# Patient Record
Sex: Male | Born: 1935
Health system: Southern US, Community
[De-identification: ages and names within clinical notes are randomized; demographics above are authoritative.]

## PROBLEM LIST (undated history)

## (undated) DIAGNOSIS — Z7901 Long term (current) use of anticoagulants: Secondary | ICD-10-CM

## (undated) DIAGNOSIS — I4891 Unspecified atrial fibrillation: Secondary | ICD-10-CM

## (undated) DIAGNOSIS — I480 Paroxysmal atrial fibrillation: Secondary | ICD-10-CM

## (undated) DIAGNOSIS — H409 Unspecified glaucoma: Secondary | ICD-10-CM

## (undated) DIAGNOSIS — E663 Overweight: Secondary | ICD-10-CM

## (undated) DIAGNOSIS — N390 Urinary tract infection, site not specified: Secondary | ICD-10-CM

## (undated) DIAGNOSIS — I609 Nontraumatic subarachnoid hemorrhage, unspecified: Secondary | ICD-10-CM

## (undated) DIAGNOSIS — Z9181 History of falling: Secondary | ICD-10-CM

## (undated) DIAGNOSIS — I10 Essential (primary) hypertension: Secondary | ICD-10-CM

## (undated) DIAGNOSIS — Z933 Colostomy status: Secondary | ICD-10-CM

## (undated) DIAGNOSIS — C189 Malignant neoplasm of colon, unspecified: Secondary | ICD-10-CM

## (undated) HISTORY — DX: Paroxysmal atrial fibrillation: I48.0

## (undated) HISTORY — DX: Essential (primary) hypertension: I10

## (undated) HISTORY — DX: Malignant neoplasm of colon, unspecified: C18.9

## (undated) HISTORY — DX: Long term (current) use of anticoagulants: Z79.01

## (undated) HISTORY — PX: CATARACT EXTRACTION: SUR2

## (undated) HISTORY — DX: Overweight: E66.3

---

## 1996-08-12 DIAGNOSIS — C189 Malignant neoplasm of colon, unspecified: Secondary | ICD-10-CM

## 1996-08-12 HISTORY — DX: Malignant neoplasm of colon, unspecified: C18.9

## 1996-08-12 HISTORY — PX: ABDOMINOPERINEAL PROCTOCOLECTOMY: SUR8

## 2000-08-12 DIAGNOSIS — I480 Paroxysmal atrial fibrillation: Secondary | ICD-10-CM

## 2000-08-12 DIAGNOSIS — Z7901 Long term (current) use of anticoagulants: Secondary | ICD-10-CM

## 2000-08-12 HISTORY — DX: Paroxysmal atrial fibrillation: I48.0

## 2000-08-12 HISTORY — DX: Long term (current) use of anticoagulants: Z79.01

## 2007-11-11 HISTORY — PX: COLONOSCOPY: SHX174

## 2011-04-09 ENCOUNTER — Encounter: Payer: Self-pay | Admitting: Cardiology

## 2011-04-09 LAB — PROTIME-INR

## 2011-05-29 ENCOUNTER — Ambulatory Visit (INDEPENDENT_AMBULATORY_CARE_PROVIDER_SITE_OTHER): Payer: Medicare Other | Admitting: *Deleted

## 2011-05-29 DIAGNOSIS — I4891 Unspecified atrial fibrillation: Secondary | ICD-10-CM

## 2011-05-29 DIAGNOSIS — Z7901 Long term (current) use of anticoagulants: Secondary | ICD-10-CM

## 2011-05-29 LAB — POCT INR: INR: 2

## 2011-06-04 ENCOUNTER — Encounter: Payer: Self-pay | Admitting: Cardiology

## 2011-06-04 ENCOUNTER — Ambulatory Visit (INDEPENDENT_AMBULATORY_CARE_PROVIDER_SITE_OTHER): Payer: Medicare Other | Admitting: Cardiology

## 2011-06-04 VITALS — BP 132/70 | HR 62 | Resp 18 | Ht 66.0 in | Wt 201.0 lb

## 2011-06-04 DIAGNOSIS — I4891 Unspecified atrial fibrillation: Secondary | ICD-10-CM

## 2011-06-04 NOTE — Patient Instructions (Addendum)
Your physician recommends that you schedule a follow-up appointment in: 1 month with Dr Dietrich Pates  Your physician recommends that you return for lab work in: this week (CBC, CMET, TSH)

## 2011-06-04 NOTE — Progress Notes (Signed)
HPI: Joel Bishop he is kindly referred by Dr. Felecia Shelling for evaluation of remote arrhythmia and management of chronic anticoagulation.  This nice gentleman was hospitalized approximately 10 years ago for what he was told was rhabdomyolysis.  During that admission, he apparently developed paroxysmal atrial fibrillation for which anticoagulation was started.  He has had no subsequent arrhythmias no symptoms to suggest arrhythmia, and no bleeding complications related to anticoagulation.  Current Outpatient Prescriptions  Medication Sig Dispense Refill  . DHA-EPA-Flaxseed Oil-Vitamin E (THERA TEARS NUTRITION PO) Take by mouth.        . diltiazem (CARDIZEM) 90 MG tablet Take 90 mg by mouth 3 (three) times daily.       Marland Kitchen latanoprost (XALATAN) 0.005 % ophthalmic solution 1 drop at bedtime.        . timolol (BETIMOL) 0.25 % ophthalmic solution 1-2 drops 2 (two) times daily.        Marland Kitchen warfarin (COUMADIN) 2 MG tablet Take 2 mg by mouth daily.         No Known Allergies    Past Medical History  Diagnosis Date  . Paroxysmal atrial fibrillation 2002    Single episode by history during a noncardiac illness  . Colon carcinoma 1998    s/p partial colectomy and permanent colostomyin 1998     Past Surgical History  Procedure Date  . Partial colectomy 1998    colon carcinoma  . Colonoscopy 2009    every 5 years  . Cataract extraction     Bilateral     Family History  Problem Relation Age of Onset  . Prostate cancer    . Colon cancer       History   Social History  . Marital Status: Married    Spouse Name: N/A    Number of Children: N/A  . Years of Education: N/A   Occupational History  . Not on file.   Social History Main Topics  . Smoking status: Never Smoker   . Smokeless tobacco: Not on file  . Alcohol Use: Not on file  . Drug Use: Not on file  . Sexually Active: Not on file   Other Topics Concern  . Not on file   Social History Narrative  . No narrative on file     ROS:  Requires corrective lenses; appetite and weight are stable; episode of swelling of the right leg that responded to local compression-diagnosis unclear by patient's description.  All other systems reviewed and are negative.  PHYSICAL EXAM: BP 132/70  Pulse 62  Resp 18  Ht 5\' 6"  (1.676 m)  Wt 91.173 kg (201 lb)  BMI 32.44 kg/m2  General-Well-developed; no acute distress Body Habitus-proportionate weight and height; + gynecomastia HEENT-[place]/AT; PERRL; EOM intact; conjunctiva and lids nl Neck-No JVD; no carotid bruits Endocrine-No thyromegaly Lungs-Clear lung fields; resonant percussion; normal I-to-E ratio Cardiovascular- normal PMI; normal S1 and S2; minimal systolic murmur at the cardiac base Abdomen-BS normal; soft and non-tender without masses or organomegaly; left lower quadrant colostomy Musculoskeletal-No deformities, cyanosis or clubbing Neurologic-Nl cranial nerves; symmetric strength and tone Skin- Warm, no significant lesions Extremities-Nl distal pulses on the right; decreased on the left.  No previous tracing for comparison.; 1/2+ ankle edema  EKG:   Normal sinus rhythm; borderline low voltage; otherwise unremarkable  ASSESSMENT AND PLAN:

## 2011-06-04 NOTE — Assessment & Plan Note (Signed)
Patient has apparently only had a single episode of self-limited atrial fibrillation.  Records are being sought from his previous PCP and local hospital.  If there has been no recurrence of arrhythmia, continuing anticoagulation is not required.  For now, a CBC and stool for Hemoccult testing will be obtained.

## 2011-06-05 ENCOUNTER — Telehealth: Payer: Self-pay | Admitting: *Deleted

## 2011-06-05 LAB — CBC
MCV: 95.9 fL (ref 78.0–100.0)
Platelets: 274 10*3/uL (ref 150–400)
RDW: 13.1 % (ref 11.5–15.5)
WBC: 9.5 10*3/uL (ref 4.0–10.5)

## 2011-06-05 LAB — COMPREHENSIVE METABOLIC PANEL
ALT: 12 U/L (ref 0–53)
AST: 18 U/L (ref 0–37)
Alkaline Phosphatase: 112 U/L (ref 39–117)
Calcium: 8.8 mg/dL (ref 8.4–10.5)
Chloride: 104 mEq/L (ref 96–112)
Creat: 0.88 mg/dL (ref 0.50–1.35)

## 2011-06-05 NOTE — Telephone Encounter (Signed)
Patient informed of normal lab results.

## 2011-06-11 ENCOUNTER — Other Ambulatory Visit: Payer: Self-pay | Admitting: Cardiology

## 2011-06-11 ENCOUNTER — Telehealth: Payer: Self-pay | Admitting: Cardiology

## 2011-06-11 MED ORDER — WARFARIN SODIUM 2 MG PO TABS: 2.0000 mg | ORAL_TABLET | ORAL | Status: DC

## 2011-06-11 MED ORDER — WARFARIN SODIUM 3 MG PO TABS: 3.0000 mg | ORAL_TABLET | ORAL | Status: DC

## 2011-06-11 NOTE — Telephone Encounter (Signed)
Rx for coumadin 2mg  & 3mg  sent to [facility]

## 2011-06-11 NOTE — Telephone Encounter (Signed)
Patient needs refill on Coumadin sent to [facility] in [place] per Malachi Bonds at Dr.Fanta's office; / tg

## 2011-06-26 ENCOUNTER — Ambulatory Visit (INDEPENDENT_AMBULATORY_CARE_PROVIDER_SITE_OTHER): Payer: Medicare Other | Admitting: *Deleted

## 2011-06-26 DIAGNOSIS — I4891 Unspecified atrial fibrillation: Secondary | ICD-10-CM

## 2011-06-26 DIAGNOSIS — Z7901 Long term (current) use of anticoagulants: Secondary | ICD-10-CM

## 2011-07-08 ENCOUNTER — Ambulatory Visit (INDEPENDENT_AMBULATORY_CARE_PROVIDER_SITE_OTHER): Payer: Medicare Other | Admitting: Cardiology

## 2011-07-08 ENCOUNTER — Encounter: Payer: Self-pay | Admitting: Cardiology

## 2011-07-08 DIAGNOSIS — Z0189 Encounter for other specified special examinations: Secondary | ICD-10-CM

## 2011-07-08 DIAGNOSIS — C189 Malignant neoplasm of colon, unspecified: Secondary | ICD-10-CM | POA: Insufficient documentation

## 2011-07-08 DIAGNOSIS — Z7901 Long term (current) use of anticoagulants: Secondary | ICD-10-CM

## 2011-07-08 DIAGNOSIS — I10 Essential (primary) hypertension: Secondary | ICD-10-CM

## 2011-07-08 DIAGNOSIS — I4891 Unspecified atrial fibrillation: Secondary | ICD-10-CM

## 2011-07-08 NOTE — Assessment & Plan Note (Signed)
Stool Hemoccult cards not returned.  Patient will be asked again to provide stool specimens.

## 2011-07-08 NOTE — Patient Instructions (Signed)
Your physician recommends that you schedule a follow-up appointment in: 6 months  Your physician recommends that you continue on your current medications as directed. Please refer to the Current Medication list given to you today.  

## 2011-07-08 NOTE — Assessment & Plan Note (Signed)
Blood pressure has been under excellent control on the 2 occasions the patient was evaluated in this office.  Current medications, which include only a single antihypertensive agent, will be continued.

## 2011-07-08 NOTE — Assessment & Plan Note (Signed)
We still have no significant information regarding patient's history of atrial fibrillation.  Hospital records will be re-requested.  Anticoagulation with warfarin will be continued for the time being.

## 2011-07-08 NOTE — Progress Notes (Signed)
Patient ID: Benno Brensinger, male   DOB: 16-Mar-1928, 77 y.o.   MRN: [id number] HPI: Mr. Kerce returns to the office as scheduled for continued assessment and treatment of atrial fibrillation for which he is being treated with long-term anticoagulation and a questionable history of deep vein thrombosis.  Since his previous visit, he has done fine.  He denies dyspnea, orthopnea, PND, chest discomfort, palpitations, lightheadedness or syncope.  He walks with a cane, but experiences no difficulty or symptoms when ambulating on the flat.  Records were obtained from his previous primary care provider, but only 9 pages out of a total of more than 50 were available for review.  Studies following the development of pedal edema failed to identify any vascular or other abnormalities.  Prior to Admission medications   Medication Sig Start Date End Date Taking? Authorizing Provider  DHA-EPA-Flaxseed Oil-Vitamin E (THERA TEARS NUTRITION PO) Apply to eye daily.    Yes Historical Provider, MD  diltiazem (CARDIZEM) 90 MG tablet Take 90 mg by mouth 3 (three) times daily.    Yes Historical Provider, MD  latanoprost (XALATAN) 0.005 % ophthalmic solution 1 drop at bedtime.     Yes Historical Provider, MD  timolol (BETIMOL) 0.25 % ophthalmic solution 1-2 drops 2 (two) times daily.     Yes Historical Provider, MD  timolol (TIMOPTIC) 0.25 % ophthalmic solution as needed. 04/26/11  Yes Historical Provider, MD  warfarin (COUMADIN) 2 MG tablet Take 1 tablet (2 mg total) by mouth every other day. 06/11/11  Yes Gerrit Friends. Jania Steinke, MD  warfarin (COUMADIN) 3 MG tablet Take 1 tablet (3 mg total) by mouth every other day. 06/11/11  Yes Gerrit Friends. Dietrich Pates, MD    No Known Allergies    Past medical history, social history, and family history reviewed and updated.  ROS: See history of present illness.  PHYSICAL EXAM: BP 131/66  Pulse 67  Ht 5\' 6"  (1.676 m)  Wt 93.895 kg (207 lb)  BMI 33.41 kg/m2   General-Well developed; no  acute distress Body habitus-proportionate weight and height Neck-No JVD; no carotid bruits Lungs-clear lung fields; resonant to percussion Cardiovascular-normal PMI; normal S1 and S2 Abdomen-normal bowel sounds; soft and non-tender without masses or organomegaly Musculoskeletal-No deformities, no cyanosis or clubbing Neurologic-Normal cranial nerves; symmetric strength and tone Skin-Warm, no significant lesions Extremities-distal pulses intact; no edema  ASSESSMENT AND PLAN:  [place] Bing, MD 07/08/2011 5:02 PM

## 2011-07-14 ENCOUNTER — Encounter: Payer: Self-pay | Admitting: Cardiology

## 2011-07-24 ENCOUNTER — Ambulatory Visit (INDEPENDENT_AMBULATORY_CARE_PROVIDER_SITE_OTHER): Payer: Medicare Other | Admitting: *Deleted

## 2011-07-24 DIAGNOSIS — Z7901 Long term (current) use of anticoagulants: Secondary | ICD-10-CM

## 2011-07-24 DIAGNOSIS — I4891 Unspecified atrial fibrillation: Secondary | ICD-10-CM

## 2011-08-22 ENCOUNTER — Ambulatory Visit (INDEPENDENT_AMBULATORY_CARE_PROVIDER_SITE_OTHER): Payer: Medicare Other | Admitting: *Deleted

## 2011-08-22 DIAGNOSIS — Z7901 Long term (current) use of anticoagulants: Secondary | ICD-10-CM

## 2011-08-22 DIAGNOSIS — I4891 Unspecified atrial fibrillation: Secondary | ICD-10-CM

## 2011-08-22 LAB — POCT INR: INR: 3.2

## 2011-09-26 ENCOUNTER — Ambulatory Visit (INDEPENDENT_AMBULATORY_CARE_PROVIDER_SITE_OTHER): Payer: Medicare Other | Admitting: *Deleted

## 2011-09-26 DIAGNOSIS — Z7901 Long term (current) use of anticoagulants: Secondary | ICD-10-CM

## 2011-09-26 DIAGNOSIS — I4891 Unspecified atrial fibrillation: Secondary | ICD-10-CM | POA: Diagnosis not present

## 2011-10-15 DIAGNOSIS — H4011X0 Primary open-angle glaucoma, stage unspecified: Secondary | ICD-10-CM | POA: Diagnosis not present

## 2011-10-15 DIAGNOSIS — H409 Unspecified glaucoma: Secondary | ICD-10-CM | POA: Diagnosis not present

## 2011-11-07 ENCOUNTER — Ambulatory Visit (INDEPENDENT_AMBULATORY_CARE_PROVIDER_SITE_OTHER): Payer: Medicare Other | Admitting: *Deleted

## 2011-11-07 DIAGNOSIS — I4891 Unspecified atrial fibrillation: Secondary | ICD-10-CM

## 2011-11-07 DIAGNOSIS — Z7901 Long term (current) use of anticoagulants: Secondary | ICD-10-CM | POA: Diagnosis not present

## 2011-12-10 DIAGNOSIS — M1990 Unspecified osteoarthritis, unspecified site: Secondary | ICD-10-CM | POA: Diagnosis not present

## 2011-12-10 DIAGNOSIS — I499 Cardiac arrhythmia, unspecified: Secondary | ICD-10-CM | POA: Diagnosis not present

## 2011-12-10 DIAGNOSIS — D010 Carcinoma in situ of colon: Secondary | ICD-10-CM | POA: Diagnosis not present

## 2011-12-19 ENCOUNTER — Ambulatory Visit (INDEPENDENT_AMBULATORY_CARE_PROVIDER_SITE_OTHER): Payer: Medicare Other | Admitting: *Deleted

## 2011-12-19 DIAGNOSIS — I4891 Unspecified atrial fibrillation: Secondary | ICD-10-CM | POA: Diagnosis not present

## 2011-12-19 DIAGNOSIS — Z7901 Long term (current) use of anticoagulants: Secondary | ICD-10-CM | POA: Diagnosis not present

## 2011-12-19 LAB — POCT INR: INR: 3.1

## 2012-01-13 ENCOUNTER — Encounter: Payer: Self-pay | Admitting: Cardiology

## 2012-01-13 ENCOUNTER — Ambulatory Visit (INDEPENDENT_AMBULATORY_CARE_PROVIDER_SITE_OTHER): Payer: Medicare Other | Admitting: Cardiology

## 2012-01-13 VITALS — BP 126/63 | HR 59 | Resp 18 | Ht 66.0 in | Wt 220.0 lb

## 2012-01-13 DIAGNOSIS — I4891 Unspecified atrial fibrillation: Secondary | ICD-10-CM | POA: Diagnosis not present

## 2012-01-13 DIAGNOSIS — Z7901 Long term (current) use of anticoagulants: Secondary | ICD-10-CM

## 2012-01-13 DIAGNOSIS — I10 Essential (primary) hypertension: Secondary | ICD-10-CM

## 2012-01-13 NOTE — Patient Instructions (Signed)
**Note De-Identified Peter Daquila Obfuscation** Your physician has recommended that you wear an event monitor. Event monitors are medical devices that record the heart's electrical activity. Doctors most often [place] these monitors to diagnose arrhythmias. Arrhythmias are problems with the speed or rhythm of the heartbeat. The monitor is a small, portable device. You can wear one while you do your normal daily activities. This is usually used to diagnose what is causing palpitations/syncope (passing out).  Your physician recommends that you complete 3 hemoccult cards and return to this office in envelope  Your physician recommends that you schedule a follow-up appointment in: 1 year

## 2012-01-13 NOTE — Assessment & Plan Note (Signed)
Blood pressure control has been excellent with his current modest antihypertensive regimen, that will be continued.

## 2012-01-13 NOTE — Assessment & Plan Note (Signed)
No clinical evidence for recurrent atrial fibrillation since the initial event associated with a severe acute illness.  Event recording will be performed for 3 weeks to verify that no asymptomatic arrhythmias are occurring.  If negative, as expected, warfarin will be discontinued and low-dose aspirin substituted.

## 2012-01-13 NOTE — Assessment & Plan Note (Signed)
INRs have been stable and therapeutic.  No further testing will be performed to exclude occult GI blood loss in light of the Impending discontinuation of warfarin therapy and patient's history of negative screening colonoscopies performed on a regular schedule.

## 2012-01-13 NOTE — Progress Notes (Deleted)
Name: Joel Bishop    DOB: 1928-04-19  Age: 78 y.o.  MR#: [id number]       PCP:  Avon Gully, MD, MD      Insurance: @PAYORNAME @   CC:    Chief Complaint  Patient presents with  . Appointment    no complaints -meds/list    VS BP 126/63  Pulse 59  Resp 18  Ht 5\' 6"  (1.676 m)  Wt 220 lb (99.791 kg)  BMI 35.51 kg/m2  Weights Current Weight  01/13/12 220 lb (99.791 kg)  07/08/11 207 lb (93.895 kg)  06/04/11 201 lb (91.173 kg)    Blood Pressure  BP Readings from Last 3 Encounters:  01/13/12 126/63  07/08/11 131/66  06/04/11 132/70     Admit date:  (Not on file) Last encounter with RMR:  Visit date not found   Allergy No Known Allergies  Current Outpatient Prescriptions  Medication Sig Dispense Refill  . diltiazem (CARDIZEM) 90 MG tablet Take 90 mg by mouth 3 (three) times daily.       Marland Kitchen latanoprost (XALATAN) 0.005 % ophthalmic solution 1 drop at bedtime.        . timolol (BETIMOL) 0.25 % ophthalmic solution 1-2 drops 2 (two) times daily.        . timolol (TIMOPTIC) 0.25 % ophthalmic solution as needed.      . warfarin (COUMADIN) 2 MG tablet Take 1 tablet (2 mg total) by mouth every other day.  30 tablet  3  . warfarin (COUMADIN) 3 MG tablet Take 1 tablet (3 mg total) by mouth every other day.  30 tablet  3    Discontinued Meds:    Medications Discontinued During This Encounter  Medication Reason  . DHA-EPA-Flaxseed Oil-Vitamin E (THERA TEARS NUTRITION PO) Error    Patient Active Problem List  Diagnoses  . Atrial fibrillation  . Chronic anticoagulation  . Laboratory test  . Colon carcinoma  . Hypertension    LABS Anti-coag visit on 12/19/2011  Component Date Value  . INR 12/19/2011 3.1   Anti-coag visit on 11/07/2011  Component Date Value  . INR 11/07/2011 2.7      Results for this Opt Visit:     Results for orders placed in visit on 12/19/11  POCT INR      Component Value Range   INR 3.1      EKG Orders placed in visit on 06/04/11  . EKG  12-LEAD     Prior Assessment and Plan Problem List as of 01/13/2012          Cardiology Problems   Atrial fibrillation   Last Assessment & Plan Note   07/08/2011 Office Visit Signed 07/08/2011  6:54 PM by Kathlen Brunswick, MD    We still have no significant information regarding patient's history of atrial fibrillation.  Hospital records will be re-requested.  Anticoagulation with warfarin will be continued for the time being.    Hypertension   Last Assessment & Plan Note   07/08/2011 Office Visit Signed 07/08/2011  7:03 PM by Kathlen Brunswick, MD    Blood pressure has been under excellent control on the 2 occasions the patient was evaluated in this office.  Current medications, which include only a single antihypertensive agent, will be continued.      Other   Chronic anticoagulation   Last Assessment & Plan Note   07/08/2011 Office Visit Signed 07/08/2011  7:03 PM by Kathlen Brunswick, MD    Stool Hemoccult cards not returned.  Patient will be asked again to provide stool specimens.    Laboratory test   Colon carcinoma       Imaging: No results found.   FRS Calculation: Score not calculated

## 2012-01-13 NOTE — Progress Notes (Signed)
Patient ID: Joel Bishop, male   DOB: 1927/10/02, 78 y.o.   MRN: [id number]  HPI: Scheduled return visit for this very nice gentleman with a remote history of atrial fibrillation.  Partial records were obtained and reviewed.   Patient was admitted with sepsis, pneumonia and rhabdomyolysis in 2002 at which time atrial fibrillation apparently occurred.  He also reports a venous ultrasound of the lower extremities approximately 3 years ago with a subsequent recommendation for him to wear compression stockings.  He is uncertain whether that examination revealed the presence of deep vein thrombosis. He asked about my previous suggestion that he could discontinue warfarin, but notes that taking this drug is not a problem for him either financially or with respect to the multiple medical contacts that are required.  He denies any significant medical problems since his last office visit.  Prior to Admission medications   Medication Sig Start Date End Date Taking? Authorizing Provider  diltiazem (CARDIZEM) 90 MG tablet Take 90 mg by mouth 3 (three) times daily.    Yes Historical Provider, MD  latanoprost (XALATAN) 0.005 % ophthalmic solution 1 drop at bedtime.     Yes Historical Provider, MD  timolol (BETIMOL) 0.25 % ophthalmic solution 1-2 drops 2 (two) times daily.     Yes Historical Provider, MD  timolol (TIMOPTIC) 0.25 % ophthalmic solution as needed. 04/26/11  Yes Historical Provider, MD  warfarin (COUMADIN) 2 MG tablet Take 1 tablet (2 mg total) by mouth every other day. 06/11/11  Yes Kathlen Brunswick, MD  warfarin (COUMADIN) 3 MG tablet Take 1 tablet (3 mg total) by mouth every other day. 06/11/11  Yes Kathlen Brunswick, MD   No Known Allergies    Past medical history, social history, and family history reviewed and updated.  ROS: Denies chest discomfort, dyspnea on exertion, orthopnea, PND, palpitations, lightheadedness, cough, sputum production, leg pain or syncope.  All other systems reviewed and  are negative.  PHYSICAL EXAM: BP 126/63  Pulse 59  Resp 18  Ht 5\' 6"  (1.676 m)  Wt 99.791 kg (220 lb)  BMI 35.51 kg/m2  General-Well developed; no acute distress Body habitus-Moderately overweight Neck-No JVD; no carotid bruits Lungs-clear lung fields; resonant to percussion Cardiovascular-normal PMI; normal S1 and S2; S4 present; minimal and nearly holosystolic murmur at the lower left sternal border and apex Abdomen-normal bowel sounds; soft and non-tender without masses or organomegaly Musculoskeletal-No deformities, no cyanosis or clubbing Neurologic-Normal cranial nerves; symmetric strength and tone Skin-Warm, no significant lesions Extremities-distal pulses intact; 1+ ankle edema; both legs wrapped in Ace bandages, which is done continuously over the past 3 years  ASSESSMENT AND PLAN:  Cornelius Bing, MD 01/13/2012 12:34 PM

## 2012-01-17 ENCOUNTER — Encounter (INDEPENDENT_AMBULATORY_CARE_PROVIDER_SITE_OTHER): Payer: Medicare Other

## 2012-01-17 DIAGNOSIS — Z7901 Long term (current) use of anticoagulants: Secondary | ICD-10-CM

## 2012-01-18 DIAGNOSIS — I4891 Unspecified atrial fibrillation: Secondary | ICD-10-CM | POA: Diagnosis not present

## 2012-01-22 ENCOUNTER — Other Ambulatory Visit: Payer: Self-pay

## 2012-01-22 DIAGNOSIS — Z7901 Long term (current) use of anticoagulants: Secondary | ICD-10-CM

## 2012-01-30 ENCOUNTER — Ambulatory Visit (INDEPENDENT_AMBULATORY_CARE_PROVIDER_SITE_OTHER): Payer: Medicare Other | Admitting: *Deleted

## 2012-01-30 DIAGNOSIS — I4891 Unspecified atrial fibrillation: Secondary | ICD-10-CM

## 2012-01-30 DIAGNOSIS — Z7901 Long term (current) use of anticoagulants: Secondary | ICD-10-CM

## 2012-01-30 LAB — POCT INR: INR: 3.1

## 2012-02-04 DIAGNOSIS — H409 Unspecified glaucoma: Secondary | ICD-10-CM | POA: Diagnosis not present

## 2012-02-04 DIAGNOSIS — H4011X0 Primary open-angle glaucoma, stage unspecified: Secondary | ICD-10-CM | POA: Diagnosis not present

## 2012-02-27 ENCOUNTER — Ambulatory Visit (INDEPENDENT_AMBULATORY_CARE_PROVIDER_SITE_OTHER): Payer: Medicare Other | Admitting: *Deleted

## 2012-02-27 DIAGNOSIS — I4891 Unspecified atrial fibrillation: Secondary | ICD-10-CM

## 2012-02-27 DIAGNOSIS — Z7901 Long term (current) use of anticoagulants: Secondary | ICD-10-CM | POA: Diagnosis not present

## 2012-03-03 DIAGNOSIS — D010 Carcinoma in situ of colon: Secondary | ICD-10-CM | POA: Diagnosis not present

## 2012-03-03 DIAGNOSIS — I499 Cardiac arrhythmia, unspecified: Secondary | ICD-10-CM | POA: Diagnosis not present

## 2012-03-09 ENCOUNTER — Other Ambulatory Visit: Payer: Self-pay | Admitting: Cardiology

## 2012-03-09 DIAGNOSIS — I4891 Unspecified atrial fibrillation: Secondary | ICD-10-CM

## 2012-03-26 ENCOUNTER — Ambulatory Visit (INDEPENDENT_AMBULATORY_CARE_PROVIDER_SITE_OTHER): Payer: Medicare Other | Admitting: *Deleted

## 2012-03-26 DIAGNOSIS — Z7901 Long term (current) use of anticoagulants: Secondary | ICD-10-CM | POA: Diagnosis not present

## 2012-03-26 DIAGNOSIS — I4891 Unspecified atrial fibrillation: Secondary | ICD-10-CM | POA: Diagnosis not present

## 2012-03-26 LAB — POCT INR: INR: 2.7

## 2012-04-18 ENCOUNTER — Other Ambulatory Visit: Payer: Self-pay | Admitting: Cardiology

## 2012-04-23 ENCOUNTER — Ambulatory Visit (INDEPENDENT_AMBULATORY_CARE_PROVIDER_SITE_OTHER): Payer: Medicare Other | Admitting: *Deleted

## 2012-04-23 DIAGNOSIS — I4891 Unspecified atrial fibrillation: Secondary | ICD-10-CM | POA: Diagnosis not present

## 2012-04-23 DIAGNOSIS — Z7901 Long term (current) use of anticoagulants: Secondary | ICD-10-CM | POA: Diagnosis not present

## 2012-04-23 LAB — POCT INR: INR: 3.8

## 2012-05-05 DIAGNOSIS — H4011X0 Primary open-angle glaucoma, stage unspecified: Secondary | ICD-10-CM | POA: Diagnosis not present

## 2012-05-05 DIAGNOSIS — H409 Unspecified glaucoma: Secondary | ICD-10-CM | POA: Diagnosis not present

## 2012-05-21 ENCOUNTER — Ambulatory Visit (INDEPENDENT_AMBULATORY_CARE_PROVIDER_SITE_OTHER): Payer: Medicare Other | Admitting: *Deleted

## 2012-05-21 DIAGNOSIS — I4891 Unspecified atrial fibrillation: Secondary | ICD-10-CM | POA: Diagnosis not present

## 2012-05-21 DIAGNOSIS — Z7901 Long term (current) use of anticoagulants: Secondary | ICD-10-CM | POA: Diagnosis not present

## 2012-05-21 LAB — POCT INR: INR: 4

## 2012-05-22 ENCOUNTER — Telehealth: Payer: Self-pay | Admitting: Cardiology

## 2012-05-22 ENCOUNTER — Encounter: Payer: Self-pay | Admitting: *Deleted

## 2012-05-22 NOTE — Telephone Encounter (Signed)
See addendum to coumadin note 05/21/12

## 2012-05-22 NOTE — Progress Notes (Signed)
This encounter was created in error - please disregard.

## 2012-05-22 NOTE — Telephone Encounter (Signed)
Would like for you to call him about his medication dose

## 2012-06-04 ENCOUNTER — Ambulatory Visit (INDEPENDENT_AMBULATORY_CARE_PROVIDER_SITE_OTHER): Payer: Medicare Other | Admitting: *Deleted

## 2012-06-04 DIAGNOSIS — I4891 Unspecified atrial fibrillation: Secondary | ICD-10-CM

## 2012-06-04 DIAGNOSIS — Z7901 Long term (current) use of anticoagulants: Secondary | ICD-10-CM | POA: Diagnosis not present

## 2012-06-09 DIAGNOSIS — Z79899 Other long term (current) drug therapy: Secondary | ICD-10-CM | POA: Diagnosis not present

## 2012-06-09 DIAGNOSIS — R799 Abnormal finding of blood chemistry, unspecified: Secondary | ICD-10-CM | POA: Diagnosis not present

## 2012-06-09 DIAGNOSIS — E669 Obesity, unspecified: Secondary | ICD-10-CM | POA: Diagnosis not present

## 2012-06-09 DIAGNOSIS — C189 Malignant neoplasm of colon, unspecified: Secondary | ICD-10-CM | POA: Diagnosis not present

## 2012-06-09 DIAGNOSIS — R7309 Other abnormal glucose: Secondary | ICD-10-CM | POA: Diagnosis not present

## 2012-06-19 ENCOUNTER — Other Ambulatory Visit: Payer: Self-pay | Admitting: Cardiology

## 2012-07-02 ENCOUNTER — Ambulatory Visit (INDEPENDENT_AMBULATORY_CARE_PROVIDER_SITE_OTHER): Payer: Medicare Other | Admitting: *Deleted

## 2012-07-02 DIAGNOSIS — Z7901 Long term (current) use of anticoagulants: Secondary | ICD-10-CM

## 2012-07-02 DIAGNOSIS — I4891 Unspecified atrial fibrillation: Secondary | ICD-10-CM | POA: Diagnosis not present

## 2012-07-28 DIAGNOSIS — H409 Unspecified glaucoma: Secondary | ICD-10-CM | POA: Diagnosis not present

## 2012-07-28 DIAGNOSIS — H4011X0 Primary open-angle glaucoma, stage unspecified: Secondary | ICD-10-CM | POA: Diagnosis not present

## 2012-07-28 DIAGNOSIS — Z961 Presence of intraocular lens: Secondary | ICD-10-CM | POA: Diagnosis not present

## 2012-07-30 ENCOUNTER — Ambulatory Visit (INDEPENDENT_AMBULATORY_CARE_PROVIDER_SITE_OTHER): Payer: Medicare Other | Admitting: *Deleted

## 2012-07-30 DIAGNOSIS — I4891 Unspecified atrial fibrillation: Secondary | ICD-10-CM | POA: Diagnosis not present

## 2012-07-30 DIAGNOSIS — Z7901 Long term (current) use of anticoagulants: Secondary | ICD-10-CM

## 2012-08-27 ENCOUNTER — Ambulatory Visit (INDEPENDENT_AMBULATORY_CARE_PROVIDER_SITE_OTHER): Payer: Medicare Other | Admitting: *Deleted

## 2012-08-27 DIAGNOSIS — I4891 Unspecified atrial fibrillation: Secondary | ICD-10-CM | POA: Diagnosis not present

## 2012-08-27 DIAGNOSIS — Z7901 Long term (current) use of anticoagulants: Secondary | ICD-10-CM | POA: Diagnosis not present

## 2012-09-08 DIAGNOSIS — D010 Carcinoma in situ of colon: Secondary | ICD-10-CM | POA: Diagnosis not present

## 2012-09-08 DIAGNOSIS — I499 Cardiac arrhythmia, unspecified: Secondary | ICD-10-CM | POA: Diagnosis not present

## 2012-09-30 ENCOUNTER — Ambulatory Visit (INDEPENDENT_AMBULATORY_CARE_PROVIDER_SITE_OTHER): Payer: Medicare Other | Admitting: *Deleted

## 2012-09-30 DIAGNOSIS — I4891 Unspecified atrial fibrillation: Secondary | ICD-10-CM | POA: Diagnosis not present

## 2012-09-30 DIAGNOSIS — Z7901 Long term (current) use of anticoagulants: Secondary | ICD-10-CM

## 2012-10-31 ENCOUNTER — Other Ambulatory Visit: Payer: Self-pay | Admitting: Cardiology

## 2012-11-10 DIAGNOSIS — H4011X0 Primary open-angle glaucoma, stage unspecified: Secondary | ICD-10-CM | POA: Diagnosis not present

## 2012-11-10 DIAGNOSIS — H409 Unspecified glaucoma: Secondary | ICD-10-CM | POA: Diagnosis not present

## 2012-11-11 ENCOUNTER — Ambulatory Visit (INDEPENDENT_AMBULATORY_CARE_PROVIDER_SITE_OTHER): Payer: Medicare Other | Admitting: *Deleted

## 2012-11-11 DIAGNOSIS — I4891 Unspecified atrial fibrillation: Secondary | ICD-10-CM | POA: Diagnosis not present

## 2012-11-11 DIAGNOSIS — Z7901 Long term (current) use of anticoagulants: Secondary | ICD-10-CM | POA: Diagnosis not present

## 2012-11-11 LAB — POCT INR: INR: 3.4

## 2012-12-08 DIAGNOSIS — I499 Cardiac arrhythmia, unspecified: Secondary | ICD-10-CM | POA: Diagnosis not present

## 2012-12-08 DIAGNOSIS — D010 Carcinoma in situ of colon: Secondary | ICD-10-CM | POA: Diagnosis not present

## 2012-12-09 ENCOUNTER — Ambulatory Visit (INDEPENDENT_AMBULATORY_CARE_PROVIDER_SITE_OTHER): Payer: Medicare Other | Admitting: *Deleted

## 2012-12-09 DIAGNOSIS — Z7901 Long term (current) use of anticoagulants: Secondary | ICD-10-CM

## 2012-12-09 DIAGNOSIS — I4891 Unspecified atrial fibrillation: Secondary | ICD-10-CM

## 2013-01-06 ENCOUNTER — Ambulatory Visit (INDEPENDENT_AMBULATORY_CARE_PROVIDER_SITE_OTHER): Payer: Medicare Other | Admitting: *Deleted

## 2013-01-06 DIAGNOSIS — Z7901 Long term (current) use of anticoagulants: Secondary | ICD-10-CM | POA: Diagnosis not present

## 2013-01-06 DIAGNOSIS — I4891 Unspecified atrial fibrillation: Secondary | ICD-10-CM | POA: Diagnosis not present

## 2013-01-13 ENCOUNTER — Encounter: Payer: Self-pay | Admitting: Cardiology

## 2013-01-13 ENCOUNTER — Ambulatory Visit (INDEPENDENT_AMBULATORY_CARE_PROVIDER_SITE_OTHER): Payer: Medicare Other | Admitting: Cardiology

## 2013-01-13 VITALS — BP 132/66 | HR 60 | Ht 66.0 in | Wt 226.0 lb

## 2013-01-13 DIAGNOSIS — Z7901 Long term (current) use of anticoagulants: Secondary | ICD-10-CM

## 2013-01-13 DIAGNOSIS — I10 Essential (primary) hypertension: Secondary | ICD-10-CM | POA: Diagnosis not present

## 2013-01-13 DIAGNOSIS — I4891 Unspecified atrial fibrillation: Secondary | ICD-10-CM | POA: Diagnosis not present

## 2013-01-13 DIAGNOSIS — C189 Malignant neoplasm of colon, unspecified: Secondary | ICD-10-CM | POA: Diagnosis not present

## 2013-01-13 NOTE — Assessment & Plan Note (Signed)
Screening colonoscopies have been conducted every 5 years, and a repeat study is planned within the next 12 months.

## 2013-01-13 NOTE — Assessment & Plan Note (Addendum)
No symptoms to suggest recurrent atrial fibrillation; however, event recording and 2013 documented PAF at a time when symptoms were also absent. Risk factors for thromboembolism include age and hypertension.  Full anticoagulation has been well tolerated and will be continued.

## 2013-01-13 NOTE — Assessment & Plan Note (Signed)
Blood pressure has been under excellent control with current medication, which will be continued.

## 2013-01-13 NOTE — Patient Instructions (Addendum)
Stool cards.  LABS:  CBC  Your physician wants you to follow-up in: 1 year.  You will receive a reminder letter in the mail two months in advance. If you don't receive a letter, please call our office to schedule the follow-up appointment.

## 2013-01-13 NOTE — Assessment & Plan Note (Signed)
There has been no evidence for occult GI blood loss. We will continue to monitor with CBCs and FOBT.

## 2013-01-13 NOTE — Progress Notes (Signed)
Patient ID: Joel Bishop, male   DOB: 01/29/1928, 79 y.o.   MRN: [id number]  HPI: Schedule return visit for continued assessment and treatment of atrial fibrillation and hypertension. Since patient's previous visit, he has done quite well. He denies all cardiopulmonary symptoms except chronic peripheral edema. He has not required urgent medical care nor develop new cardiac issues. He has not experienced symptoms suggesting recurrent atrial arrhythmias, but has had documented atrial fibrillation without symptoms in the past.  Current Outpatient Prescriptions  Medication Sig Dispense Refill  . diltiazem (CARDIZEM) 90 MG tablet Take 90 mg by mouth 3 (three) times daily.       Marland Kitchen latanoprost (XALATAN) 0.005 % ophthalmic solution 1 drop at bedtime.        . timolol (BETIMOL) 0.25 % ophthalmic solution 1-2 drops 2 (two) times daily.        . timolol (TIMOPTIC) 0.25 % ophthalmic solution as needed.      . warfarin (COUMADIN) 2 MG tablet TAKE ONE TABLET BY MOUTH EVERY OTHER DAY  30 tablet  0  . warfarin (COUMADIN) 3 MG tablet TAKE ONE TABLET BY MOUTH EVERY OTHER DAY  30 tablet  6   No current facility-administered medications for this visit.   No Known Allergies   Past medical history, social history, and family history reviewed and updated.  ROS: Denies chest pain, palpitations, lightheadedness or syncope. All other systems reviewed and are negative.  PHYSICAL EXAM: BP 132/66  Pulse 60  Ht 5\' 6"  (1.676 m)  Wt 102.513 kg (226 lb)  BMI 36.49 kg/m2;  Body mass index is 36.49 kg/(m^2). General-Well developed; no acute distress Body habitus-moderately overweight Neck-No JVD; no carotid bruits Lungs-clear lung fields; resonant to percussion Cardiovascular-normal PMI; normal S1 and S2; prominent S4; regular rhythm Abdomen-normal bowel sounds; soft and non-tender without masses or organomegaly Musculoskeletal-No deformities, no cyanosis or clubbing Neurologic-Normal cranial nerves; symmetric  strength and tone Skin-Warm, no significant lesions Extremities-distal pulses intact; 2+ ankle edema with elastic bandages in place  Joel Bing, MD 01/13/2013  6:49 PM  ASSESSMENT AND PLAN

## 2013-01-14 ENCOUNTER — Encounter: Payer: Self-pay | Admitting: *Deleted

## 2013-01-14 ENCOUNTER — Encounter: Payer: Self-pay | Admitting: Cardiology

## 2013-01-14 LAB — CBC WITH DIFFERENTIAL/PLATELET
Eosinophils Absolute: 0.3 10*3/uL (ref 0.0–0.7)
Eosinophils Relative: 4 % (ref 0–5)
HCT: 40.6 % (ref 39.0–52.0)
Hemoglobin: 13.7 g/dL (ref 13.0–17.0)
Lymphs Abs: 1.5 10*3/uL (ref 0.7–4.0)
MCH: 30.9 pg (ref 26.0–34.0)
MCV: 91.4 fL (ref 78.0–100.0)
Monocytes Absolute: 1 10*3/uL (ref 0.1–1.0)
Monocytes Relative: 12 % (ref 3–12)
Neutrophils Relative %: 66 % (ref 43–77)
RBC: 4.44 MIL/uL (ref 4.22–5.81)

## 2013-01-17 ENCOUNTER — Encounter: Payer: Self-pay | Admitting: Cardiology

## 2013-01-19 ENCOUNTER — Ambulatory Visit (INDEPENDENT_AMBULATORY_CARE_PROVIDER_SITE_OTHER): Payer: Medicare Other | Admitting: *Deleted

## 2013-01-19 DIAGNOSIS — Z7901 Long term (current) use of anticoagulants: Secondary | ICD-10-CM | POA: Diagnosis not present

## 2013-01-19 LAB — POC HEMOCCULT BLD/STL (HOME/3-CARD/SCREEN)
Card #2 Fecal Occult Blod, POC: NEGATIVE
Card #3 Fecal Occult Blood, POC: NEGATIVE

## 2013-01-22 ENCOUNTER — Encounter: Payer: Self-pay | Admitting: *Deleted

## 2013-01-22 ENCOUNTER — Encounter: Payer: Self-pay | Admitting: Cardiology

## 2013-01-27 ENCOUNTER — Encounter: Payer: Self-pay | Admitting: Cardiology

## 2013-01-27 ENCOUNTER — Ambulatory Visit (INDEPENDENT_AMBULATORY_CARE_PROVIDER_SITE_OTHER): Payer: Medicare Other | Admitting: *Deleted

## 2013-01-27 DIAGNOSIS — Z7901 Long term (current) use of anticoagulants: Secondary | ICD-10-CM | POA: Diagnosis not present

## 2013-01-27 DIAGNOSIS — I4891 Unspecified atrial fibrillation: Secondary | ICD-10-CM | POA: Diagnosis not present

## 2013-02-23 DIAGNOSIS — H4011X0 Primary open-angle glaucoma, stage unspecified: Secondary | ICD-10-CM | POA: Diagnosis not present

## 2013-02-23 DIAGNOSIS — H409 Unspecified glaucoma: Secondary | ICD-10-CM | POA: Diagnosis not present

## 2013-02-24 ENCOUNTER — Ambulatory Visit (INDEPENDENT_AMBULATORY_CARE_PROVIDER_SITE_OTHER): Payer: Medicare Other | Admitting: *Deleted

## 2013-02-24 DIAGNOSIS — Z7901 Long term (current) use of anticoagulants: Secondary | ICD-10-CM | POA: Diagnosis not present

## 2013-02-24 DIAGNOSIS — I4891 Unspecified atrial fibrillation: Secondary | ICD-10-CM | POA: Diagnosis not present

## 2013-02-24 LAB — POCT INR: INR: 3.4

## 2013-03-02 DIAGNOSIS — I499 Cardiac arrhythmia, unspecified: Secondary | ICD-10-CM | POA: Diagnosis not present

## 2013-03-02 DIAGNOSIS — D010 Carcinoma in situ of colon: Secondary | ICD-10-CM | POA: Diagnosis not present

## 2013-03-02 DIAGNOSIS — I10 Essential (primary) hypertension: Secondary | ICD-10-CM | POA: Diagnosis not present

## 2013-03-29 ENCOUNTER — Ambulatory Visit (INDEPENDENT_AMBULATORY_CARE_PROVIDER_SITE_OTHER): Payer: Medicare Other | Admitting: *Deleted

## 2013-03-29 DIAGNOSIS — Z7901 Long term (current) use of anticoagulants: Secondary | ICD-10-CM

## 2013-03-29 DIAGNOSIS — I4891 Unspecified atrial fibrillation: Secondary | ICD-10-CM

## 2013-04-01 ENCOUNTER — Encounter: Payer: Self-pay | Admitting: Gastroenterology

## 2013-04-01 ENCOUNTER — Ambulatory Visit (INDEPENDENT_AMBULATORY_CARE_PROVIDER_SITE_OTHER): Payer: Medicare Other | Admitting: Gastroenterology

## 2013-04-01 VITALS — BP 132/64 | HR 65 | Temp 97.0°F | Ht 66.0 in | Wt 221.6 lb

## 2013-04-01 DIAGNOSIS — C189 Malignant neoplasm of colon, unspecified: Secondary | ICD-10-CM

## 2013-04-01 NOTE — Patient Instructions (Addendum)
1. I will discuss your case with Dr. Jena Gauss and call you with further recommendations. We will retrieve copy of your original operative report for review.

## 2013-04-01 NOTE — Progress Notes (Signed)
Primary Care Physician:  Avon Gully, MD  Primary Gastroenterologist:  Roetta Sessions, MD   Chief Complaint  Patient presents with  . Colonoscopy    HPI:  Joel Bishop is a 79 y.o. male here to discuss possibility of having another surveillance colonoscopy. He reports being diagnosed with colon cancer in 1998. He had APR with permanent colostomy at that time. He has continue surveillance every five years since that time but wonders if he needs to continue at this time. His last colonoscopy was done in 11/2007 by Dr. Cher Nakai at [facility] in [place]. Report indicated that the patient had h/o rectal cancer. TCS in 2004 unremarkable. Findings of 2009 colonoscopy "His colon was completely unremarkable." No other information provided. Procedure done through the ostomy.   Patient denies abdominal pain, diarrhea, constipation, melena, brbpr, heartburn, vomiting, weight loss, dysphagia.   Current Outpatient Prescriptions  Medication Sig Dispense Refill  . diltiazem (CARDIZEM) 90 MG tablet Take 90 mg by mouth 3 (three) times daily.       Marland Kitchen latanoprost (XALATAN) 0.005 % ophthalmic solution 1 drop at bedtime.        . timolol (BETIMOL) 0.25 % ophthalmic solution 1-2 drops 2 (two) times daily.        . timolol (TIMOPTIC) 0.25 % ophthalmic solution as needed.      . warfarin (COUMADIN) 2 MG tablet TAKE ONE TABLET BY MOUTH EVERY OTHER DAY  30 tablet  0  . warfarin (COUMADIN) 3 MG tablet TAKE ONE TABLET BY MOUTH EVERY OTHER DAY  30 tablet  6   No current facility-administered medications for this visit.    Allergies as of 04/01/2013  . (No Known Allergies)    Past Medical History  Diagnosis Date  . Paroxysmal atrial fibrillation 2002    Single episode by history during a noncardiac illness  . Colon carcinoma 1998    s/p partial colectomy and permanent colostomyin 1998  . Glaucoma   . Hypertension   . Overweight(278.02)   . Chronic anticoagulation 2002     Past Surgical History  Procedure Laterality Date  . Abdominoperineal proctocolectomy  1998    colon carcinoma; permanent colostomy  . Colonoscopy  11/2007    Dr. Leonette Most [facility], via ostomy. unremarkable.  . Cataract extraction      Bilateral    Family History  Problem Relation Age of Onset  . Uterine cancer Maternal Aunt   . Cancer Mother     gallbladder  . Pancreatic cancer Brother   . Liver cancer Sister   . Other Brother     amylodosis  . Kidney cancer Sister     History   Social History  . Marital Status: Married    Spouse Name: N/A    Number of Children: N/A  . Years of Education: N/A   Occupational History  . PHD    Social History Main Topics  . Smoking status: Never Smoker   . Smokeless tobacco: Not on file  . Alcohol Use: Not on file  . Drug Use: No  . Sexual Activity: Not on file   Other Topics Concern  . Not on file   Social History Narrative  . No narrative on file      ROS:  General: Negative for anorexia, weight loss, fever, chills, fatigue, weakness. Eyes: Negative for vision changes.  ENT: Negative for hoarseness, difficulty swallowing , nasal congestion. CV: Negative for chest pain, angina, palpitations, dyspnea on exertion, peripheral edema.  Respiratory: Negative for dyspnea at rest, dyspnea on exertion, cough, sputum, wheezing.  GI: See history of present illness. GU:  Negative for dysuria, hematuria, urinary incontinence, urinary frequency, nocturnal urination.  MS: Negative for joint pain, low back pain.  Derm: Negative for rash or itching.  Neuro: Negative for weakness, abnormal sensation, seizure, frequent headaches, memory loss, confusion.  Psych: Negative for anxiety, depression, suicidal ideation, hallucinations.  Endo: Negative for unusual weight change.  Heme: Negative for bruising or bleeding. Allergy: Negative for rash or hives.    Physical Examination:  BP 132/64  Pulse 65   Temp(Src) 97 F (36.1 C) (Oral)  Ht 5\' 6"  (1.676 m)  Wt 221 lb 9.6 oz (100.517 kg)  BMI 35.78 kg/m2   General: Well-nourished, well-developed in no acute distress.  Head: Normocephalic, atraumatic.   Eyes: Conjunctiva pink, no icterus. Mouth: Oropharyngeal mucosa moist and pink , no lesions erythema or exudate. Neck: Supple without thyromegaly, masses, or lymphadenopathy.  Lungs: Clear to auscultation bilaterally.  Heart: Regular rate and rhythm, no murmurs rubs or gallops.  Abdomen: Bowel sounds are normal, nontender, nondistended, no hepatosplenomegaly or masses, no abdominal bruits or    hernia , no rebound or guarding.  Healthy appearing ostomy noted in LLQ.  Rectal: N/A. Extremities: No lower extremity edema. No clubbing or deformities.  Neuro: Alert and oriented x 4 , grossly normal neurologically.  Skin: Warm and dry, no rash or jaundice.   Psych: Alert and cooperative, normal mood and affect.  Labs: Lab Results  Component Value Date   WBC 8.7 01/13/2013   HGB 13.7 01/13/2013   HCT 40.6 01/13/2013   MCV 91.4 01/13/2013   PLT 311 01/13/2013     Imaging Studies: No results found.

## 2013-04-02 ENCOUNTER — Encounter: Payer: Self-pay | Admitting: Gastroenterology

## 2013-04-02 NOTE — Assessment & Plan Note (Addendum)
H/O rectal cancer s/p APR with permanent colostomy in 1998. [place]. Patient states all of his colonoscopies since his surgery have been normal. He has no GI problems at this time and no evidence of anemia. Discussed with Dr. Jena Gauss. Given patient's age and no symptoms or objective findings, we could consider no further colonoscopies. Of course, given we don't know extent of his prior surgery, colonoscopies, we would offer him one last colonoscopy at this time if he so desires.   I will contact patient to determine if he desires colonoscopy. If so, we will hold his coumadin four days prior to procedure. We have made attempt to retrieve prior medical records but none available at time of this dictation.

## 2013-04-05 NOTE — Progress Notes (Signed)
CC'd to PCP 

## 2013-04-07 NOTE — Progress Notes (Signed)
Spoke with the patient again regarding considering one last colonoscopy based on Dr. Jena Gauss recommendations. At this time he is not interested in pursuing a colonoscopy. He states he'll call us if he has any problems or changes his mind.

## 2013-04-26 ENCOUNTER — Ambulatory Visit (INDEPENDENT_AMBULATORY_CARE_PROVIDER_SITE_OTHER): Payer: Medicare Other | Admitting: *Deleted

## 2013-04-26 DIAGNOSIS — Z7901 Long term (current) use of anticoagulants: Secondary | ICD-10-CM

## 2013-04-26 DIAGNOSIS — I4891 Unspecified atrial fibrillation: Secondary | ICD-10-CM

## 2013-05-05 DIAGNOSIS — Z23 Encounter for immunization: Secondary | ICD-10-CM | POA: Diagnosis not present

## 2013-05-13 ENCOUNTER — Other Ambulatory Visit: Payer: Self-pay | Admitting: Cardiology

## 2013-05-24 ENCOUNTER — Ambulatory Visit (INDEPENDENT_AMBULATORY_CARE_PROVIDER_SITE_OTHER): Payer: Medicare Other | Admitting: *Deleted

## 2013-05-24 DIAGNOSIS — Z7901 Long term (current) use of anticoagulants: Secondary | ICD-10-CM

## 2013-05-24 DIAGNOSIS — I4891 Unspecified atrial fibrillation: Secondary | ICD-10-CM

## 2013-05-24 LAB — POCT INR: INR: 1.9

## 2013-06-01 DIAGNOSIS — H4011X0 Primary open-angle glaucoma, stage unspecified: Secondary | ICD-10-CM | POA: Diagnosis not present

## 2013-06-01 DIAGNOSIS — H409 Unspecified glaucoma: Secondary | ICD-10-CM | POA: Diagnosis not present

## 2013-06-08 DIAGNOSIS — Z79899 Other long term (current) drug therapy: Secondary | ICD-10-CM | POA: Diagnosis not present

## 2013-06-08 DIAGNOSIS — E669 Obesity, unspecified: Secondary | ICD-10-CM | POA: Diagnosis not present

## 2013-06-08 DIAGNOSIS — R799 Abnormal finding of blood chemistry, unspecified: Secondary | ICD-10-CM | POA: Diagnosis not present

## 2013-06-08 DIAGNOSIS — Z0000 Encounter for general adult medical examination without abnormal findings: Secondary | ICD-10-CM | POA: Diagnosis not present

## 2013-06-08 DIAGNOSIS — C189 Malignant neoplasm of colon, unspecified: Secondary | ICD-10-CM | POA: Diagnosis not present

## 2013-06-21 ENCOUNTER — Ambulatory Visit (INDEPENDENT_AMBULATORY_CARE_PROVIDER_SITE_OTHER): Payer: Medicare Other | Admitting: *Deleted

## 2013-06-21 DIAGNOSIS — I4891 Unspecified atrial fibrillation: Secondary | ICD-10-CM | POA: Diagnosis not present

## 2013-06-21 DIAGNOSIS — Z7901 Long term (current) use of anticoagulants: Secondary | ICD-10-CM

## 2013-06-21 LAB — POCT INR: INR: 3.1

## 2013-07-19 ENCOUNTER — Ambulatory Visit (INDEPENDENT_AMBULATORY_CARE_PROVIDER_SITE_OTHER): Payer: Medicare Other | Admitting: *Deleted

## 2013-07-19 DIAGNOSIS — I4891 Unspecified atrial fibrillation: Secondary | ICD-10-CM | POA: Diagnosis not present

## 2013-07-19 DIAGNOSIS — Z7901 Long term (current) use of anticoagulants: Secondary | ICD-10-CM | POA: Diagnosis not present

## 2013-07-19 LAB — POCT INR: INR: 2.3

## 2013-08-23 ENCOUNTER — Ambulatory Visit (INDEPENDENT_AMBULATORY_CARE_PROVIDER_SITE_OTHER): Payer: Medicare Other | Admitting: *Deleted

## 2013-08-23 DIAGNOSIS — I4891 Unspecified atrial fibrillation: Secondary | ICD-10-CM

## 2013-08-23 DIAGNOSIS — Z7901 Long term (current) use of anticoagulants: Secondary | ICD-10-CM

## 2013-08-23 LAB — POCT INR: INR: 2.3

## 2013-08-31 DIAGNOSIS — Z961 Presence of intraocular lens: Secondary | ICD-10-CM | POA: Diagnosis not present

## 2013-08-31 DIAGNOSIS — H4011X0 Primary open-angle glaucoma, stage unspecified: Secondary | ICD-10-CM | POA: Diagnosis not present

## 2013-08-31 DIAGNOSIS — H409 Unspecified glaucoma: Secondary | ICD-10-CM | POA: Diagnosis not present

## 2013-09-21 DIAGNOSIS — I499 Cardiac arrhythmia, unspecified: Secondary | ICD-10-CM | POA: Diagnosis not present

## 2013-09-27 ENCOUNTER — Ambulatory Visit (INDEPENDENT_AMBULATORY_CARE_PROVIDER_SITE_OTHER): Payer: Medicare Other | Admitting: *Deleted

## 2013-09-27 DIAGNOSIS — Z7901 Long term (current) use of anticoagulants: Secondary | ICD-10-CM

## 2013-09-27 DIAGNOSIS — I4891 Unspecified atrial fibrillation: Secondary | ICD-10-CM

## 2013-09-27 DIAGNOSIS — Z5181 Encounter for therapeutic drug level monitoring: Secondary | ICD-10-CM | POA: Diagnosis not present

## 2013-09-27 LAB — POCT INR: INR: 3.4

## 2013-11-01 ENCOUNTER — Ambulatory Visit (INDEPENDENT_AMBULATORY_CARE_PROVIDER_SITE_OTHER): Payer: Medicare Other | Admitting: *Deleted

## 2013-11-01 DIAGNOSIS — Z5181 Encounter for therapeutic drug level monitoring: Secondary | ICD-10-CM | POA: Diagnosis not present

## 2013-11-01 DIAGNOSIS — I4891 Unspecified atrial fibrillation: Secondary | ICD-10-CM | POA: Diagnosis not present

## 2013-11-01 DIAGNOSIS — Z7901 Long term (current) use of anticoagulants: Secondary | ICD-10-CM | POA: Diagnosis not present

## 2013-11-01 LAB — POCT INR: INR: 3.4

## 2013-11-29 ENCOUNTER — Ambulatory Visit (INDEPENDENT_AMBULATORY_CARE_PROVIDER_SITE_OTHER): Payer: Medicare Other | Admitting: *Deleted

## 2013-11-29 DIAGNOSIS — Z7901 Long term (current) use of anticoagulants: Secondary | ICD-10-CM | POA: Diagnosis not present

## 2013-11-29 DIAGNOSIS — Z5181 Encounter for therapeutic drug level monitoring: Secondary | ICD-10-CM

## 2013-11-29 DIAGNOSIS — I4891 Unspecified atrial fibrillation: Secondary | ICD-10-CM | POA: Diagnosis not present

## 2013-11-29 LAB — POCT INR: INR: 2.8

## 2013-12-14 DIAGNOSIS — H4011X0 Primary open-angle glaucoma, stage unspecified: Secondary | ICD-10-CM | POA: Diagnosis not present

## 2013-12-14 DIAGNOSIS — H409 Unspecified glaucoma: Secondary | ICD-10-CM | POA: Diagnosis not present

## 2013-12-26 ENCOUNTER — Other Ambulatory Visit: Payer: Self-pay | Admitting: Cardiology

## 2013-12-27 ENCOUNTER — Ambulatory Visit (INDEPENDENT_AMBULATORY_CARE_PROVIDER_SITE_OTHER): Payer: Medicare Other | Admitting: *Deleted

## 2013-12-27 DIAGNOSIS — Z5181 Encounter for therapeutic drug level monitoring: Secondary | ICD-10-CM | POA: Diagnosis not present

## 2013-12-27 DIAGNOSIS — Z7901 Long term (current) use of anticoagulants: Secondary | ICD-10-CM | POA: Diagnosis not present

## 2013-12-27 DIAGNOSIS — I4891 Unspecified atrial fibrillation: Secondary | ICD-10-CM | POA: Diagnosis not present

## 2013-12-27 LAB — POCT INR: INR: 3

## 2014-01-18 DIAGNOSIS — I499 Cardiac arrhythmia, unspecified: Secondary | ICD-10-CM | POA: Diagnosis not present

## 2014-01-18 DIAGNOSIS — D010 Carcinoma in situ of colon: Secondary | ICD-10-CM | POA: Diagnosis not present

## 2014-01-24 ENCOUNTER — Ambulatory Visit (INDEPENDENT_AMBULATORY_CARE_PROVIDER_SITE_OTHER): Payer: Medicare Other | Admitting: *Deleted

## 2014-01-24 DIAGNOSIS — Z7901 Long term (current) use of anticoagulants: Secondary | ICD-10-CM | POA: Diagnosis not present

## 2014-01-24 DIAGNOSIS — Z5181 Encounter for therapeutic drug level monitoring: Secondary | ICD-10-CM

## 2014-01-24 DIAGNOSIS — I4891 Unspecified atrial fibrillation: Secondary | ICD-10-CM | POA: Diagnosis not present

## 2014-01-24 LAB — POCT INR: INR: 2.8

## 2014-03-07 ENCOUNTER — Ambulatory Visit (INDEPENDENT_AMBULATORY_CARE_PROVIDER_SITE_OTHER): Payer: Medicare Other | Admitting: *Deleted

## 2014-03-07 DIAGNOSIS — Z5181 Encounter for therapeutic drug level monitoring: Secondary | ICD-10-CM

## 2014-03-07 DIAGNOSIS — Z7901 Long term (current) use of anticoagulants: Secondary | ICD-10-CM

## 2014-03-07 DIAGNOSIS — I4891 Unspecified atrial fibrillation: Secondary | ICD-10-CM | POA: Diagnosis not present

## 2014-03-07 LAB — POCT INR: INR: 2.3

## 2014-03-22 DIAGNOSIS — H4011X0 Primary open-angle glaucoma, stage unspecified: Secondary | ICD-10-CM | POA: Diagnosis not present

## 2014-03-22 DIAGNOSIS — H409 Unspecified glaucoma: Secondary | ICD-10-CM | POA: Diagnosis not present

## 2014-04-12 DIAGNOSIS — I498 Other specified cardiac arrhythmias: Secondary | ICD-10-CM | POA: Diagnosis not present

## 2014-04-20 ENCOUNTER — Ambulatory Visit (INDEPENDENT_AMBULATORY_CARE_PROVIDER_SITE_OTHER): Payer: Medicare Other | Admitting: *Deleted

## 2014-04-20 DIAGNOSIS — I4891 Unspecified atrial fibrillation: Secondary | ICD-10-CM

## 2014-04-20 DIAGNOSIS — Z7901 Long term (current) use of anticoagulants: Secondary | ICD-10-CM

## 2014-04-20 DIAGNOSIS — Z5181 Encounter for therapeutic drug level monitoring: Secondary | ICD-10-CM

## 2014-04-20 LAB — POCT INR: INR: 3.3

## 2014-05-03 DIAGNOSIS — Z23 Encounter for immunization: Secondary | ICD-10-CM | POA: Diagnosis not present

## 2014-05-18 ENCOUNTER — Ambulatory Visit (INDEPENDENT_AMBULATORY_CARE_PROVIDER_SITE_OTHER): Payer: Medicare Other | Admitting: *Deleted

## 2014-05-18 DIAGNOSIS — I4891 Unspecified atrial fibrillation: Secondary | ICD-10-CM

## 2014-05-18 DIAGNOSIS — Z5181 Encounter for therapeutic drug level monitoring: Secondary | ICD-10-CM | POA: Diagnosis not present

## 2014-05-18 DIAGNOSIS — Z7901 Long term (current) use of anticoagulants: Secondary | ICD-10-CM | POA: Diagnosis not present

## 2014-05-18 LAB — POCT INR: INR: 2.2

## 2014-06-15 ENCOUNTER — Ambulatory Visit (INDEPENDENT_AMBULATORY_CARE_PROVIDER_SITE_OTHER): Payer: Medicare Other | Admitting: *Deleted

## 2014-06-15 DIAGNOSIS — Z5181 Encounter for therapeutic drug level monitoring: Secondary | ICD-10-CM | POA: Diagnosis not present

## 2014-06-15 DIAGNOSIS — I4891 Unspecified atrial fibrillation: Secondary | ICD-10-CM

## 2014-06-15 DIAGNOSIS — Z7901 Long term (current) use of anticoagulants: Secondary | ICD-10-CM

## 2014-06-15 LAB — POCT INR: INR: 2.7

## 2014-07-05 DIAGNOSIS — H4011X1 Primary open-angle glaucoma, mild stage: Secondary | ICD-10-CM | POA: Diagnosis not present

## 2014-07-12 DIAGNOSIS — I459 Conduction disorder, unspecified: Secondary | ICD-10-CM | POA: Diagnosis not present

## 2014-07-12 DIAGNOSIS — C189 Malignant neoplasm of colon, unspecified: Secondary | ICD-10-CM | POA: Diagnosis not present

## 2014-07-12 DIAGNOSIS — Z933 Colostomy status: Secondary | ICD-10-CM | POA: Diagnosis not present

## 2014-07-13 ENCOUNTER — Ambulatory Visit (INDEPENDENT_AMBULATORY_CARE_PROVIDER_SITE_OTHER): Payer: Medicare Other | Admitting: *Deleted

## 2014-07-13 DIAGNOSIS — Z5181 Encounter for therapeutic drug level monitoring: Secondary | ICD-10-CM | POA: Diagnosis not present

## 2014-07-13 DIAGNOSIS — Z7901 Long term (current) use of anticoagulants: Secondary | ICD-10-CM

## 2014-07-13 DIAGNOSIS — I4891 Unspecified atrial fibrillation: Secondary | ICD-10-CM

## 2014-07-13 LAB — POCT INR: INR: 3.3

## 2014-08-10 ENCOUNTER — Ambulatory Visit (INDEPENDENT_AMBULATORY_CARE_PROVIDER_SITE_OTHER): Payer: Medicare Other | Admitting: *Deleted

## 2014-08-10 DIAGNOSIS — Z7901 Long term (current) use of anticoagulants: Secondary | ICD-10-CM

## 2014-08-10 DIAGNOSIS — I4891 Unspecified atrial fibrillation: Secondary | ICD-10-CM | POA: Diagnosis not present

## 2014-08-10 DIAGNOSIS — Z5181 Encounter for therapeutic drug level monitoring: Secondary | ICD-10-CM | POA: Diagnosis not present

## 2014-08-10 DIAGNOSIS — I480 Paroxysmal atrial fibrillation: Secondary | ICD-10-CM | POA: Diagnosis not present

## 2014-08-10 LAB — POCT INR: INR: 2.4

## 2014-08-31 ENCOUNTER — Encounter: Payer: Self-pay | Admitting: Cardiology

## 2014-08-31 ENCOUNTER — Ambulatory Visit (INDEPENDENT_AMBULATORY_CARE_PROVIDER_SITE_OTHER): Payer: Medicare Other | Admitting: Cardiology

## 2014-08-31 ENCOUNTER — Ambulatory Visit (INDEPENDENT_AMBULATORY_CARE_PROVIDER_SITE_OTHER): Payer: Medicare Other | Admitting: *Deleted

## 2014-08-31 VITALS — BP 110/88 | HR 91 | Ht 66.5 in | Wt 225.0 lb

## 2014-08-31 DIAGNOSIS — I10 Essential (primary) hypertension: Secondary | ICD-10-CM

## 2014-08-31 DIAGNOSIS — I4891 Unspecified atrial fibrillation: Secondary | ICD-10-CM

## 2014-08-31 DIAGNOSIS — Z5181 Encounter for therapeutic drug level monitoring: Secondary | ICD-10-CM

## 2014-08-31 DIAGNOSIS — Z7901 Long term (current) use of anticoagulants: Secondary | ICD-10-CM

## 2014-08-31 LAB — POCT INR: INR: 2.7

## 2014-08-31 NOTE — Progress Notes (Signed)
Clinical Summary Joel Bishop is a 81 y.o.male former patient of Dr Lattie Haw, this is our first visit together. He is seen for the following medical problems.  1. Afib - denies any palpitations - rate controlled with dilt, anticoag with coumadin. Denies any bleeding issues. Has not been interested in NOACs.   2. HTN - does not check at home - compliant with meds    Past Medical History  Diagnosis Date  . Paroxysmal atrial fibrillation 2002    Single episode by history during a noncardiac illness  . Colon carcinoma 1998    s/p partial colectomy and permanent colostomyin 1998  . Glaucoma   . Hypertension   . Overweight   . Chronic anticoagulation 2002     No Known Allergies   Current Outpatient Prescriptions  Medication Sig Dispense Refill  . diltiazem (CARDIZEM) 90 MG tablet Take 90 mg by mouth 3 (three) times daily.     Marland Kitchen latanoprost (XALATAN) 0.005 % ophthalmic solution 1 drop at bedtime.      . timolol (BETIMOL) 0.25 % ophthalmic solution 1-2 drops 2 (two) times daily.      . timolol (TIMOPTIC) 0.25 % ophthalmic solution as needed.    . warfarin (COUMADIN) 3 MG tablet Take 1/2 tablet daily except 1 tablet on Mondays and Thursdays 30 tablet 6   No current facility-administered medications for this visit.     Past Surgical History  Procedure Laterality Date  . Abdominoperineal proctocolectomy  1998    colon carcinoma; permanent colostomy  . Colonoscopy  11/2007    Dr. Juanda Crumble [facility], via ostomy. unremarkable.  . Cataract extraction      Bilateral     No Known Allergies    Family History  Problem Relation Age of Onset  . Uterine cancer Maternal Aunt   . Cancer Mother     gallbladder  . Pancreatic cancer Brother   . Liver cancer Sister   . Other Brother     amylodosis  . Kidney cancer Sister      Social History Joel Bishop reports that he has never smoked. He does not have any smokeless tobacco history on  file. Joel Bishop has no alcohol history on file.   Review of Systems CONSTITUTIONAL: No weight loss, fever, chills, weakness or fatigue.  HEENT: Eyes: No visual loss, blurred vision, double vision or yellow sclerae.No hearing loss, sneezing, congestion, runny nose or sore throat.  SKIN: No rash or itching.  CARDIOVASCULAR: per HPI RESPIRATORY: No shortness of breath, cough or sputum.  GASTROINTESTINAL: No anorexia, nausea, vomiting or diarrhea. No abdominal pain or blood.  GENITOURINARY: No burning on urination, no polyuria NEUROLOGICAL: No headache, dizziness, syncope, paralysis, ataxia, numbness or tingling in the extremities. No change in bowel or bladder control.  MUSCULOSKELETAL: No muscle, back pain, joint pain or stiffness.  LYMPHATICS: No enlarged nodes. No history of splenectomy.  PSYCHIATRIC: No history of depression or anxiety.  ENDOCRINOLOGIC: No reports of sweating, cold or heat intolerance. No polyuria or polydipsia.  Marland Kitchen   Physical Examination p 91 bp 110/88 Wt 225 lbs BMI 36 Gen: resting comfortably, no acute distress HEENT: no scleral icterus, pupils equal round and reactive, no palptable cervical adenopathy,  CV: irreg, no m/r/g, no JVD Resp: Clear to auscultation bilaterally GI: abdomen is soft, non-tender, non-distended, normal bowel sounds, no hepatosplenomegaly MSK: extremities are warm, no edema.  Skin: warm, no rash Neuro:  no focal deficits Psych: appropriate affect   Assessment and  Plan  1. Afib - no current symptoms, continue current meds  2. HTN - at goal, continue current meds   F/u 1 year      Arnoldo Lenis, M.D.

## 2014-08-31 NOTE — Patient Instructions (Signed)
Your physician wants you to follow-up in: 1 year. You will receive a reminder letter in the mail two months in advance. If you don't receive a letter, please call our office to schedule the follow-up appointment.  Your physician recommends that you continue on your current medications as directed. Please refer to the Current Medication list given to you today.  We will request lab result from Dr. Legrand Rams  Thank you for choosing [facility]!

## 2014-09-28 ENCOUNTER — Ambulatory Visit (INDEPENDENT_AMBULATORY_CARE_PROVIDER_SITE_OTHER): Payer: Medicare Other | Admitting: *Deleted

## 2014-09-28 DIAGNOSIS — I4891 Unspecified atrial fibrillation: Secondary | ICD-10-CM

## 2014-09-28 DIAGNOSIS — Z5181 Encounter for therapeutic drug level monitoring: Secondary | ICD-10-CM

## 2014-09-28 DIAGNOSIS — I480 Paroxysmal atrial fibrillation: Secondary | ICD-10-CM | POA: Diagnosis not present

## 2014-09-28 DIAGNOSIS — Z7901 Long term (current) use of anticoagulants: Secondary | ICD-10-CM | POA: Diagnosis not present

## 2014-09-28 LAB — POCT INR: INR: 2.9

## 2014-10-11 DIAGNOSIS — H4011X1 Primary open-angle glaucoma, mild stage: Secondary | ICD-10-CM | POA: Diagnosis not present

## 2014-10-13 DIAGNOSIS — Z933 Colostomy status: Secondary | ICD-10-CM | POA: Diagnosis not present

## 2014-10-13 DIAGNOSIS — C189 Malignant neoplasm of colon, unspecified: Secondary | ICD-10-CM | POA: Diagnosis not present

## 2014-10-13 DIAGNOSIS — E784 Other hyperlipidemia: Secondary | ICD-10-CM | POA: Diagnosis not present

## 2014-10-13 DIAGNOSIS — I459 Conduction disorder, unspecified: Secondary | ICD-10-CM | POA: Diagnosis not present

## 2014-10-13 DIAGNOSIS — C61 Malignant neoplasm of prostate: Secondary | ICD-10-CM | POA: Diagnosis not present

## 2014-11-09 ENCOUNTER — Ambulatory Visit (INDEPENDENT_AMBULATORY_CARE_PROVIDER_SITE_OTHER): Payer: Medicare Other | Admitting: *Deleted

## 2014-11-09 DIAGNOSIS — I480 Paroxysmal atrial fibrillation: Secondary | ICD-10-CM

## 2014-11-09 DIAGNOSIS — I4891 Unspecified atrial fibrillation: Secondary | ICD-10-CM

## 2014-11-09 DIAGNOSIS — Z5181 Encounter for therapeutic drug level monitoring: Secondary | ICD-10-CM | POA: Diagnosis not present

## 2014-11-09 DIAGNOSIS — Z7901 Long term (current) use of anticoagulants: Secondary | ICD-10-CM

## 2014-11-09 LAB — POCT INR: INR: 3

## 2014-11-28 ENCOUNTER — Telehealth: Payer: Self-pay | Admitting: Cardiology

## 2014-11-28 MED ORDER — WARFARIN SODIUM 3 MG PO TABS: ORAL_TABLET | ORAL | Status: DC

## 2014-11-28 NOTE — Telephone Encounter (Signed)
Received fax refill request  Rx # [id number] Medication:  Coumadin 3mg  Tab Qty 30 Sig:  Take one-half tablet by mouth once daily except take one tablet on Mondays and Thursdays Physician:  Carlyle Dolly

## 2014-12-21 ENCOUNTER — Ambulatory Visit (INDEPENDENT_AMBULATORY_CARE_PROVIDER_SITE_OTHER): Payer: Medicare Other | Admitting: *Deleted

## 2014-12-21 DIAGNOSIS — I4891 Unspecified atrial fibrillation: Secondary | ICD-10-CM | POA: Diagnosis not present

## 2014-12-21 DIAGNOSIS — Z7901 Long term (current) use of anticoagulants: Secondary | ICD-10-CM | POA: Diagnosis not present

## 2014-12-21 DIAGNOSIS — Z5181 Encounter for therapeutic drug level monitoring: Secondary | ICD-10-CM | POA: Diagnosis not present

## 2014-12-21 LAB — POCT INR: INR: 2.6

## 2015-01-19 DIAGNOSIS — I459 Conduction disorder, unspecified: Secondary | ICD-10-CM | POA: Diagnosis not present

## 2015-01-25 DIAGNOSIS — Z23 Encounter for immunization: Secondary | ICD-10-CM | POA: Diagnosis not present

## 2015-02-01 ENCOUNTER — Ambulatory Visit (INDEPENDENT_AMBULATORY_CARE_PROVIDER_SITE_OTHER): Payer: Medicare Other | Admitting: *Deleted

## 2015-02-01 DIAGNOSIS — Z7901 Long term (current) use of anticoagulants: Secondary | ICD-10-CM

## 2015-02-01 DIAGNOSIS — I4891 Unspecified atrial fibrillation: Secondary | ICD-10-CM | POA: Diagnosis not present

## 2015-02-01 DIAGNOSIS — Z5181 Encounter for therapeutic drug level monitoring: Secondary | ICD-10-CM | POA: Diagnosis not present

## 2015-02-01 LAB — POCT INR: INR: 3.2

## 2015-02-07 DIAGNOSIS — Z961 Presence of intraocular lens: Secondary | ICD-10-CM | POA: Diagnosis not present

## 2015-02-07 DIAGNOSIS — H4011X1 Primary open-angle glaucoma, mild stage: Secondary | ICD-10-CM | POA: Diagnosis not present

## 2015-03-01 ENCOUNTER — Ambulatory Visit (INDEPENDENT_AMBULATORY_CARE_PROVIDER_SITE_OTHER): Payer: Medicare Other | Admitting: *Deleted

## 2015-03-01 DIAGNOSIS — Z7901 Long term (current) use of anticoagulants: Secondary | ICD-10-CM

## 2015-03-01 DIAGNOSIS — I4891 Unspecified atrial fibrillation: Secondary | ICD-10-CM

## 2015-03-01 DIAGNOSIS — Z5181 Encounter for therapeutic drug level monitoring: Secondary | ICD-10-CM

## 2015-03-01 LAB — POCT INR: INR: 3

## 2015-04-12 ENCOUNTER — Ambulatory Visit (INDEPENDENT_AMBULATORY_CARE_PROVIDER_SITE_OTHER): Payer: Medicare Other | Admitting: *Deleted

## 2015-04-12 DIAGNOSIS — I4891 Unspecified atrial fibrillation: Secondary | ICD-10-CM

## 2015-04-12 DIAGNOSIS — Z7901 Long term (current) use of anticoagulants: Secondary | ICD-10-CM | POA: Diagnosis not present

## 2015-04-12 DIAGNOSIS — Z5181 Encounter for therapeutic drug level monitoring: Secondary | ICD-10-CM | POA: Diagnosis not present

## 2015-04-12 LAB — POCT INR: INR: 3.8

## 2015-04-27 DIAGNOSIS — Z7901 Long term (current) use of anticoagulants: Secondary | ICD-10-CM | POA: Diagnosis not present

## 2015-04-27 DIAGNOSIS — Z933 Colostomy status: Secondary | ICD-10-CM | POA: Diagnosis not present

## 2015-04-27 DIAGNOSIS — I499 Cardiac arrhythmia, unspecified: Secondary | ICD-10-CM | POA: Diagnosis not present

## 2015-04-27 DIAGNOSIS — Z23 Encounter for immunization: Secondary | ICD-10-CM | POA: Diagnosis not present

## 2015-05-01 ENCOUNTER — Ambulatory Visit (INDEPENDENT_AMBULATORY_CARE_PROVIDER_SITE_OTHER): Payer: Medicare Other | Admitting: *Deleted

## 2015-05-01 DIAGNOSIS — I4891 Unspecified atrial fibrillation: Secondary | ICD-10-CM

## 2015-05-01 DIAGNOSIS — Z5181 Encounter for therapeutic drug level monitoring: Secondary | ICD-10-CM | POA: Diagnosis not present

## 2015-05-01 DIAGNOSIS — Z7901 Long term (current) use of anticoagulants: Secondary | ICD-10-CM | POA: Diagnosis not present

## 2015-05-01 LAB — POCT INR: INR: 3

## 2015-05-02 DIAGNOSIS — H4011X1 Primary open-angle glaucoma, mild stage: Secondary | ICD-10-CM | POA: Diagnosis not present

## 2015-05-29 ENCOUNTER — Ambulatory Visit (INDEPENDENT_AMBULATORY_CARE_PROVIDER_SITE_OTHER): Payer: Medicare Other | Admitting: *Deleted

## 2015-05-29 DIAGNOSIS — Z5181 Encounter for therapeutic drug level monitoring: Secondary | ICD-10-CM

## 2015-05-29 DIAGNOSIS — I482 Chronic atrial fibrillation: Secondary | ICD-10-CM

## 2015-05-29 DIAGNOSIS — I4821 Permanent atrial fibrillation: Secondary | ICD-10-CM

## 2015-05-29 DIAGNOSIS — Z7901 Long term (current) use of anticoagulants: Secondary | ICD-10-CM

## 2015-05-29 DIAGNOSIS — I4891 Unspecified atrial fibrillation: Secondary | ICD-10-CM

## 2015-05-29 LAB — POCT INR: INR: 3.8

## 2015-06-19 ENCOUNTER — Ambulatory Visit (INDEPENDENT_AMBULATORY_CARE_PROVIDER_SITE_OTHER): Payer: Medicare Other | Admitting: *Deleted

## 2015-06-19 DIAGNOSIS — Z5181 Encounter for therapeutic drug level monitoring: Secondary | ICD-10-CM

## 2015-06-19 DIAGNOSIS — Z7901 Long term (current) use of anticoagulants: Secondary | ICD-10-CM

## 2015-06-19 DIAGNOSIS — I4891 Unspecified atrial fibrillation: Secondary | ICD-10-CM | POA: Diagnosis not present

## 2015-06-19 LAB — POCT INR: INR: 2

## 2015-07-17 ENCOUNTER — Ambulatory Visit (INDEPENDENT_AMBULATORY_CARE_PROVIDER_SITE_OTHER): Payer: Medicare Other | Admitting: *Deleted

## 2015-07-17 DIAGNOSIS — Z5181 Encounter for therapeutic drug level monitoring: Secondary | ICD-10-CM | POA: Diagnosis not present

## 2015-07-17 DIAGNOSIS — I4891 Unspecified atrial fibrillation: Secondary | ICD-10-CM

## 2015-07-17 DIAGNOSIS — Z7901 Long term (current) use of anticoagulants: Secondary | ICD-10-CM | POA: Diagnosis not present

## 2015-07-17 LAB — POCT INR: INR: 2.2

## 2015-07-27 DIAGNOSIS — Z933 Colostomy status: Secondary | ICD-10-CM | POA: Diagnosis not present

## 2015-07-27 DIAGNOSIS — I499 Cardiac arrhythmia, unspecified: Secondary | ICD-10-CM | POA: Diagnosis not present

## 2015-08-01 DIAGNOSIS — H401131 Primary open-angle glaucoma, bilateral, mild stage: Secondary | ICD-10-CM | POA: Diagnosis not present

## 2015-08-24 ENCOUNTER — Encounter: Payer: Self-pay | Admitting: Cardiology

## 2015-08-24 ENCOUNTER — Ambulatory Visit (INDEPENDENT_AMBULATORY_CARE_PROVIDER_SITE_OTHER): Payer: Medicare Other | Admitting: Cardiology

## 2015-08-24 VITALS — BP 112/64 | HR 85 | Ht 66.0 in | Wt 222.0 lb

## 2015-08-24 DIAGNOSIS — I4891 Unspecified atrial fibrillation: Secondary | ICD-10-CM | POA: Diagnosis not present

## 2015-08-24 DIAGNOSIS — I10 Essential (primary) hypertension: Secondary | ICD-10-CM

## 2015-08-24 NOTE — Progress Notes (Signed)
Patient ID: Boe Goffredo, male   DOB: Apr 12, 1928, 82 y.o.   MRN: [id number]     Clinical Summary Mr. Filmore is a 82 y.o.male seen today for follow up of the following medical problems.   1. Afib - denies any palpitations - Has not been interested in NOACs, remains on coumadin  - not interested in long acting diltiazem, he prefers to take short acting tid.  - no bleeding troubles on coumadin.    2. HTN - does not check at home - compliant with meds   3. Chronic LE edema - venous efficiency. Not comfortable with stockings.     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   Past Medical History  Diagnosis Date  . Paroxysmal atrial fibrillation 2002    Single episode by history during a noncardiac illness  . Colon carcinoma 1998    s/p partial colectomy and permanent colostomyin 1998  . Glaucoma   . Hypertension   . Overweight(278.02)   . Chronic anticoagulation 2002     No Known Allergies   Current Outpatient Prescriptions  Medication Sig Dispense Refill  . diltiazem (CARDIZEM) 90 MG tablet Take 90 mg by mouth 3 (three) times daily.     Marland Kitchen latanoprost (XALATAN) 0.005 % ophthalmic solution 1 drop at bedtime.      . timolol (BETIMOL) 0.25 % ophthalmic solution 1-2 drops 2 (two) times daily.      Marland Kitchen warfarin (COUMADIN) 3 MG tablet Take 1/2 tablet daily except 1 tablet on Mondays and Thursdays 30 tablet 6   No current facility-administered medications for this visit.     Past Surgical History  Procedure Laterality Date  . Abdominoperineal proctocolectomy  1998    colon carcinoma; permanent colostomy  . Colonoscopy  11/2007    Dr. Juanda Crumble [facility], via ostomy. unremarkable.  . Cataract extraction      Bilateral     No Known Allergies    Family History  Problem Relation Age of Onset  . Uterine cancer Maternal Aunt   . Cancer Mother     gallbladder    . Pancreatic cancer Brother   . Liver cancer Sister   . Other Brother     amylodosis  . Kidney cancer Sister      Social History Mr. Scharff reports that he has never smoked. He does not have any smokeless tobacco history on file. Mr. Child has no alcohol history on file.   Review of Systems CONSTITUTIONAL: No weight loss, fever, chills, weakness or fatigue.  HEENT: Eyes: No visual loss, blurred vision, double vision or yellow sclerae.No hearing loss, sneezing, congestion, runny nose or sore throat.  SKIN: No rash or itching.  CARDIOVASCULAR: per HPI RESPIRATORY: No shortness of breath, cough or sputum.  GASTROINTESTINAL: No anorexia, nausea, vomiting or diarrhea. No abdominal pain or blood.  GENITOURINARY: No burning on urination, no polyuria NEUROLOGICAL: No headache, dizziness, syncope, paralysis, ataxia, numbness or tingling in the extremities. No change in bowel or bladder control.  MUSCULOSKELETAL: No muscle, back pain, joint pain or stiffness.  LYMPHATICS: No enlarged nodes. No history of splenectomy.  PSYCHIATRIC: No history of depression or anxiety.  ENDOCRINOLOGIC: No reports of sweating, cold or heat intolerance. No polyuria or polydipsia.  Marland Kitchen   Physical Examination Filed Vitals:   08/24/15 1038  BP: 112/64  Pulse: 85   Filed Vitals:   08/24/15 1038  Height: 5\' 6"  (1.676 m)  Weight:  222 lb (100.699 kg)    Gen: resting comfortably, no acute distress HEENT: no scleral icterus, pupils equal round and reactive, no palptable cervical adenopathy,  CV: irreg, no m/r/g, no jvd Resp: Clear to auscultation bilaterally GI: abdomen is soft, non-tender, non-distended, normal bowel sounds, no hepatosplenomegaly MSK: extremities are warm, 1+ bilateral edema  Skin: warm, no rash Neuro:  no focal deficits Psych: appropriate affect    Assessment and Plan  1. Afib - no current symptoms, continue current meds  2. HTN - bp is at goal, continue current  meds    F/u 1 year   Arnoldo Lenis, M.D.

## 2015-08-24 NOTE — Patient Instructions (Signed)
Your physician wants you to follow-up in: 1 year with Dr Branch You will receive a reminder letter in the mail two months in advance. If you don't receive a letter, please call our office to schedule the follow-up appointment.     Your physician recommends that you continue on your current medications as directed. Please refer to the Current Medication list given to you today.     If you need a refill on your cardiac medications before your next appointment, please call your pharmacy.     Thank you for choosing [facility] !        

## 2015-08-28 ENCOUNTER — Ambulatory Visit (INDEPENDENT_AMBULATORY_CARE_PROVIDER_SITE_OTHER): Payer: Medicare Other | Admitting: *Deleted

## 2015-08-28 DIAGNOSIS — I4891 Unspecified atrial fibrillation: Secondary | ICD-10-CM | POA: Diagnosis not present

## 2015-08-28 DIAGNOSIS — Z7901 Long term (current) use of anticoagulants: Secondary | ICD-10-CM

## 2015-08-28 DIAGNOSIS — Z5181 Encounter for therapeutic drug level monitoring: Secondary | ICD-10-CM

## 2015-08-28 LAB — POCT INR: INR: 1.9

## 2015-09-20 ENCOUNTER — Ambulatory Visit (INDEPENDENT_AMBULATORY_CARE_PROVIDER_SITE_OTHER): Payer: Medicare Other | Admitting: *Deleted

## 2015-09-20 DIAGNOSIS — I4891 Unspecified atrial fibrillation: Secondary | ICD-10-CM | POA: Diagnosis not present

## 2015-09-20 DIAGNOSIS — Z5181 Encounter for therapeutic drug level monitoring: Secondary | ICD-10-CM | POA: Diagnosis not present

## 2015-09-20 DIAGNOSIS — Z7901 Long term (current) use of anticoagulants: Secondary | ICD-10-CM | POA: Diagnosis not present

## 2015-09-20 LAB — POCT INR: INR: 3.1

## 2015-10-18 ENCOUNTER — Ambulatory Visit (INDEPENDENT_AMBULATORY_CARE_PROVIDER_SITE_OTHER): Payer: Medicare Other | Admitting: Pharmacist

## 2015-10-18 DIAGNOSIS — Z5181 Encounter for therapeutic drug level monitoring: Secondary | ICD-10-CM

## 2015-10-18 DIAGNOSIS — Z7901 Long term (current) use of anticoagulants: Secondary | ICD-10-CM

## 2015-10-18 DIAGNOSIS — I4891 Unspecified atrial fibrillation: Secondary | ICD-10-CM | POA: Diagnosis not present

## 2015-10-18 LAB — POCT INR: INR: 2.6

## 2015-11-02 DIAGNOSIS — Z933 Colostomy status: Secondary | ICD-10-CM | POA: Diagnosis not present

## 2015-11-02 DIAGNOSIS — I482 Chronic atrial fibrillation: Secondary | ICD-10-CM | POA: Diagnosis not present

## 2015-11-02 DIAGNOSIS — I10 Essential (primary) hypertension: Secondary | ICD-10-CM | POA: Diagnosis not present

## 2015-11-06 ENCOUNTER — Other Ambulatory Visit: Payer: Self-pay | Admitting: *Deleted

## 2015-11-06 MED ORDER — WARFARIN SODIUM 3 MG PO TABS: ORAL_TABLET | ORAL | Status: DC

## 2015-11-14 DIAGNOSIS — H401131 Primary open-angle glaucoma, bilateral, mild stage: Secondary | ICD-10-CM | POA: Diagnosis not present

## 2015-11-15 ENCOUNTER — Ambulatory Visit (INDEPENDENT_AMBULATORY_CARE_PROVIDER_SITE_OTHER): Payer: Medicare Other | Admitting: *Deleted

## 2015-11-15 DIAGNOSIS — Z5181 Encounter for therapeutic drug level monitoring: Secondary | ICD-10-CM | POA: Diagnosis not present

## 2015-11-15 DIAGNOSIS — Z7901 Long term (current) use of anticoagulants: Secondary | ICD-10-CM | POA: Diagnosis not present

## 2015-11-15 DIAGNOSIS — I4891 Unspecified atrial fibrillation: Secondary | ICD-10-CM

## 2015-11-15 LAB — POCT INR: INR: 2.1

## 2015-12-13 ENCOUNTER — Ambulatory Visit (INDEPENDENT_AMBULATORY_CARE_PROVIDER_SITE_OTHER): Payer: Medicare Other | Admitting: *Deleted

## 2015-12-13 DIAGNOSIS — I4891 Unspecified atrial fibrillation: Secondary | ICD-10-CM

## 2015-12-13 DIAGNOSIS — Z5181 Encounter for therapeutic drug level monitoring: Secondary | ICD-10-CM | POA: Diagnosis not present

## 2015-12-13 DIAGNOSIS — Z7901 Long term (current) use of anticoagulants: Secondary | ICD-10-CM

## 2015-12-13 LAB — POCT INR: INR: 2.4

## 2016-01-22 ENCOUNTER — Ambulatory Visit (INDEPENDENT_AMBULATORY_CARE_PROVIDER_SITE_OTHER): Payer: Medicare Other | Admitting: *Deleted

## 2016-01-22 DIAGNOSIS — I4891 Unspecified atrial fibrillation: Secondary | ICD-10-CM | POA: Diagnosis not present

## 2016-01-22 DIAGNOSIS — Z5181 Encounter for therapeutic drug level monitoring: Secondary | ICD-10-CM

## 2016-01-22 DIAGNOSIS — Z7901 Long term (current) use of anticoagulants: Secondary | ICD-10-CM | POA: Diagnosis not present

## 2016-01-22 LAB — POCT INR: INR: 2.4

## 2016-02-08 DIAGNOSIS — I10 Essential (primary) hypertension: Secondary | ICD-10-CM | POA: Diagnosis not present

## 2016-02-08 DIAGNOSIS — C61 Malignant neoplasm of prostate: Secondary | ICD-10-CM | POA: Diagnosis not present

## 2016-02-08 DIAGNOSIS — I482 Chronic atrial fibrillation: Secondary | ICD-10-CM | POA: Diagnosis not present

## 2016-02-08 DIAGNOSIS — Z933 Colostomy status: Secondary | ICD-10-CM | POA: Diagnosis not present

## 2016-02-08 DIAGNOSIS — Z7901 Long term (current) use of anticoagulants: Secondary | ICD-10-CM | POA: Diagnosis not present

## 2016-02-20 DIAGNOSIS — H401131 Primary open-angle glaucoma, bilateral, mild stage: Secondary | ICD-10-CM | POA: Diagnosis not present

## 2016-02-20 DIAGNOSIS — Z961 Presence of intraocular lens: Secondary | ICD-10-CM | POA: Diagnosis not present

## 2016-03-04 ENCOUNTER — Ambulatory Visit (INDEPENDENT_AMBULATORY_CARE_PROVIDER_SITE_OTHER): Payer: Medicare Other | Admitting: *Deleted

## 2016-03-04 DIAGNOSIS — I4891 Unspecified atrial fibrillation: Secondary | ICD-10-CM | POA: Diagnosis not present

## 2016-03-04 DIAGNOSIS — Z5181 Encounter for therapeutic drug level monitoring: Secondary | ICD-10-CM

## 2016-03-04 DIAGNOSIS — Z7901 Long term (current) use of anticoagulants: Secondary | ICD-10-CM | POA: Diagnosis not present

## 2016-03-04 LAB — POCT INR: INR: 2

## 2016-04-12 ENCOUNTER — Other Ambulatory Visit: Payer: Self-pay

## 2016-04-17 ENCOUNTER — Ambulatory Visit (INDEPENDENT_AMBULATORY_CARE_PROVIDER_SITE_OTHER): Payer: Medicare Other | Admitting: *Deleted

## 2016-04-17 DIAGNOSIS — Z5181 Encounter for therapeutic drug level monitoring: Secondary | ICD-10-CM | POA: Diagnosis not present

## 2016-04-17 DIAGNOSIS — Z7901 Long term (current) use of anticoagulants: Secondary | ICD-10-CM | POA: Diagnosis not present

## 2016-04-17 DIAGNOSIS — I4891 Unspecified atrial fibrillation: Secondary | ICD-10-CM

## 2016-04-17 LAB — POCT INR: INR: 3

## 2016-05-06 DIAGNOSIS — Z23 Encounter for immunization: Secondary | ICD-10-CM | POA: Diagnosis not present

## 2016-05-06 DIAGNOSIS — I482 Chronic atrial fibrillation: Secondary | ICD-10-CM | POA: Diagnosis not present

## 2016-05-06 DIAGNOSIS — Z933 Colostomy status: Secondary | ICD-10-CM | POA: Diagnosis not present

## 2016-05-06 DIAGNOSIS — I890 Lymphedema, not elsewhere classified: Secondary | ICD-10-CM | POA: Diagnosis not present

## 2016-05-28 DIAGNOSIS — H401131 Primary open-angle glaucoma, bilateral, mild stage: Secondary | ICD-10-CM | POA: Diagnosis not present

## 2016-05-29 ENCOUNTER — Ambulatory Visit (INDEPENDENT_AMBULATORY_CARE_PROVIDER_SITE_OTHER): Payer: Medicare Other | Admitting: *Deleted

## 2016-05-29 DIAGNOSIS — I4891 Unspecified atrial fibrillation: Secondary | ICD-10-CM

## 2016-05-29 DIAGNOSIS — Z7901 Long term (current) use of anticoagulants: Secondary | ICD-10-CM

## 2016-05-29 DIAGNOSIS — Z5181 Encounter for therapeutic drug level monitoring: Secondary | ICD-10-CM

## 2016-05-29 LAB — POCT INR: INR: 2.1

## 2016-07-10 ENCOUNTER — Ambulatory Visit (INDEPENDENT_AMBULATORY_CARE_PROVIDER_SITE_OTHER): Payer: Medicare Other | Admitting: *Deleted

## 2016-07-10 DIAGNOSIS — Z7901 Long term (current) use of anticoagulants: Secondary | ICD-10-CM

## 2016-07-10 DIAGNOSIS — Z5181 Encounter for therapeutic drug level monitoring: Secondary | ICD-10-CM | POA: Diagnosis not present

## 2016-07-10 DIAGNOSIS — I4891 Unspecified atrial fibrillation: Secondary | ICD-10-CM | POA: Diagnosis not present

## 2016-07-10 LAB — POCT INR: INR: 2.2

## 2016-07-15 DIAGNOSIS — Z7901 Long term (current) use of anticoagulants: Secondary | ICD-10-CM | POA: Diagnosis not present

## 2016-07-15 DIAGNOSIS — Z933 Colostomy status: Secondary | ICD-10-CM | POA: Diagnosis not present

## 2016-07-15 DIAGNOSIS — I482 Chronic atrial fibrillation: Secondary | ICD-10-CM | POA: Diagnosis not present

## 2016-08-21 ENCOUNTER — Ambulatory Visit (INDEPENDENT_AMBULATORY_CARE_PROVIDER_SITE_OTHER): Payer: Medicare Other | Admitting: *Deleted

## 2016-08-21 DIAGNOSIS — I4891 Unspecified atrial fibrillation: Secondary | ICD-10-CM

## 2016-08-21 DIAGNOSIS — Z5181 Encounter for therapeutic drug level monitoring: Secondary | ICD-10-CM

## 2016-08-21 DIAGNOSIS — Z7901 Long term (current) use of anticoagulants: Secondary | ICD-10-CM

## 2016-08-21 LAB — POCT INR: INR: 3.3

## 2016-09-03 DIAGNOSIS — H401131 Primary open-angle glaucoma, bilateral, mild stage: Secondary | ICD-10-CM | POA: Diagnosis not present

## 2016-09-06 ENCOUNTER — Ambulatory Visit (INDEPENDENT_AMBULATORY_CARE_PROVIDER_SITE_OTHER): Payer: Medicare Other | Admitting: Cardiology

## 2016-09-06 ENCOUNTER — Encounter: Payer: Self-pay | Admitting: Cardiology

## 2016-09-06 VITALS — BP 116/68 | HR 87 | Ht 66.0 in | Wt 222.0 lb

## 2016-09-06 DIAGNOSIS — I4891 Unspecified atrial fibrillation: Secondary | ICD-10-CM

## 2016-09-06 DIAGNOSIS — I10 Essential (primary) hypertension: Secondary | ICD-10-CM | POA: Diagnosis not present

## 2016-09-06 NOTE — Progress Notes (Signed)
Clinical Summary [name] is a 83 y.o.male seen today for follow up of the following medical problems.   1. Afib - denies any palpitations - Has not been interested in NOACs, remains on coumadin  - not interested in long acting diltiazem, he prefers to take short acting tid.    - no recent palpitations. No bleeding issues on coumadin.   2. HTN - does not check bp regularly at home - compliant with meds   3. Chronic LE edema - secondary to venous insufficiency. He prefers self wrapping with ACE bandages as opposed to compressoin stocking.     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  [name].    Past Medical History:  Diagnosis Date  . Chronic anticoagulation 2002  . Colon carcinoma ([place]) 1998   s/p partial colectomy and permanent colostomyin 1998  . Glaucoma   . Hypertension   . Overweight(278.02)   . Paroxysmal atrial fibrillation ([place]) 2002   Single episode by history during a noncardiac illness     No Known Allergies   Current Outpatient Prescriptions  Medication Sig Dispense Refill  . diltiazem (CARDIZEM) 90 MG tablet Take 90 mg by mouth 3 (three) times daily.     Marland Kitchen latanoprost (XALATAN) 0.005 % ophthalmic solution Place 1 drop into both eyes at bedtime.     . timolol (BETIMOL) 0.25 % ophthalmic solution Place 1-2 drops into the left eye 2 (two) times daily.     Marland Kitchen warfarin (COUMADIN) 3 MG tablet Take 1/2 tablet daily except 1 tablet on Mondays 30 tablet 6   No current facility-administered medications for this visit.      Past Surgical History:  Procedure Laterality Date  . ABDOMINOPERINEAL PROCTOCOLECTOMY  1998   colon carcinoma; permanent colostomy  . CATARACT EXTRACTION     Bilateral  . COLONOSCOPY  11/2007   Dr. Juanda [facility], via ostomy. unremarkable.     No Known  Allergies    Family History  Problem Relation Age of Onset  . Uterine cancer Maternal Aunt   . Cancer Mother     gallbladder  . Pancreatic cancer Brother   . Liver cancer Sister   . Other Brother     amylodosis  . Kidney cancer Sister      Social History Joel Bishop reports that he has never smoked. He does not have any smokeless tobacco history on file. Joel Bishop has no alcohol history on file.   Review of Systems CONSTITUTIONAL: No weight loss, fever, chills, weakness or fatigue.  HEENT: Eyes: No visual loss, blurred vision, double vision or yellow sclerae.No hearing loss, sneezing, congestion, runny nose or sore throat.  SKIN: No rash or itching.  CARDIOVASCULAR: per HPI RESPIRATORY: No shortness of breath, cough or sputum.  GASTROINTESTINAL: No anorexia, nausea, vomiting or diarrhea. No abdominal pain or blood.  GENITOURINARY: No burning on urination, no polyuria NEUROLOGICAL: No headache, dizziness, syncope, paralysis, ataxia, numbness or tingling in the extremities. No change in bowel or bladder control.  MUSCULOSKELETAL: No muscle, back pain, joint pain or stiffness.  LYMPHATICS: No enlarged nodes. No history of splenectomy.  PSYCHIATRIC: No history of depression or anxiety.  ENDOCRINOLOGIC: No reports of sweating, cold or heat intolerance. No polyuria or polydipsia.  Marland Kitchen   Physical Examination Vitals:   09/06/16 1327  BP: 116/68  Pulse: 87   Vitals:  09/06/16 1327  Weight: 222 lb (100.7 kg)  Height: 5\' 6"  (1.676 m)    Gen: resting comfortably, no acute distress HEENT: no scleral icterus, pupils equal round and reactive, no palptable cervical adenopathy,  CV: irreg, no mrg, no jvd Resp: Clear to auscultation bilaterally GI: abdomen is soft, non-tender, non-distended, normal bowel sounds, no hepatosplenomegaly MSK: extremities are warm, no edema.  Skin: warm, no rash Neuro:  no focal deficits Psych: appropriate affect      Assessment and Plan   1. Afib - no current symptoms, continue current meds - CHADS2Vasc score is 3, continue coumadin  2. HTN -his  bp is at goal, continue current meds    F/u 1 year      Arnoldo Lenis, M.D., F.A.C.C.

## 2016-09-06 NOTE — Patient Instructions (Signed)
Your physician wants you to follow-up in: 1 year Dr Branch You will receive a reminder letter in the mail two months in advance. If you don't receive a letter, please call our office to schedule the follow-up appointment.    Your physician recommends that you continue on your current medications as directed. Please refer to the Current Medication list given to you today.    If you need a refill on your cardiac medications before your next appointment, please call your pharmacy.    Thank you for choosing [facility] !         

## 2016-09-19 DIAGNOSIS — T1490XA Injury, unspecified, initial encounter: Secondary | ICD-10-CM | POA: Diagnosis not present

## 2016-10-02 ENCOUNTER — Ambulatory Visit (INDEPENDENT_AMBULATORY_CARE_PROVIDER_SITE_OTHER): Payer: Medicare Other | Admitting: *Deleted

## 2016-10-02 DIAGNOSIS — Z7901 Long term (current) use of anticoagulants: Secondary | ICD-10-CM | POA: Diagnosis not present

## 2016-10-02 DIAGNOSIS — I4891 Unspecified atrial fibrillation: Secondary | ICD-10-CM

## 2016-10-02 DIAGNOSIS — Z5181 Encounter for therapeutic drug level monitoring: Secondary | ICD-10-CM

## 2016-10-02 LAB — POCT INR: INR: 3

## 2016-10-15 DIAGNOSIS — I10 Essential (primary) hypertension: Secondary | ICD-10-CM | POA: Diagnosis not present

## 2016-10-15 DIAGNOSIS — Z6834 Body mass index (BMI) 34.0-34.9, adult: Secondary | ICD-10-CM | POA: Diagnosis not present

## 2016-10-15 DIAGNOSIS — Z933 Colostomy status: Secondary | ICD-10-CM | POA: Diagnosis not present

## 2016-10-15 DIAGNOSIS — I482 Chronic atrial fibrillation: Secondary | ICD-10-CM | POA: Diagnosis not present

## 2016-11-03 ENCOUNTER — Other Ambulatory Visit: Payer: Self-pay | Admitting: Cardiology

## 2016-11-13 ENCOUNTER — Ambulatory Visit (INDEPENDENT_AMBULATORY_CARE_PROVIDER_SITE_OTHER): Payer: Medicare Other | Admitting: *Deleted

## 2016-11-13 DIAGNOSIS — Z7901 Long term (current) use of anticoagulants: Secondary | ICD-10-CM | POA: Diagnosis not present

## 2016-11-13 DIAGNOSIS — I4891 Unspecified atrial fibrillation: Secondary | ICD-10-CM

## 2016-11-13 DIAGNOSIS — Z5181 Encounter for therapeutic drug level monitoring: Secondary | ICD-10-CM

## 2016-11-13 LAB — POCT INR: INR: 3

## 2016-11-26 DIAGNOSIS — H401131 Primary open-angle glaucoma, bilateral, mild stage: Secondary | ICD-10-CM | POA: Diagnosis not present

## 2016-12-25 ENCOUNTER — Ambulatory Visit (INDEPENDENT_AMBULATORY_CARE_PROVIDER_SITE_OTHER): Payer: Medicare Other | Admitting: *Deleted

## 2016-12-25 DIAGNOSIS — Z5181 Encounter for therapeutic drug level monitoring: Secondary | ICD-10-CM

## 2016-12-25 DIAGNOSIS — Z7901 Long term (current) use of anticoagulants: Secondary | ICD-10-CM | POA: Diagnosis not present

## 2016-12-25 DIAGNOSIS — I4891 Unspecified atrial fibrillation: Secondary | ICD-10-CM

## 2016-12-25 LAB — POCT INR: INR: 2.9

## 2017-01-13 DIAGNOSIS — Z7901 Long term (current) use of anticoagulants: Secondary | ICD-10-CM | POA: Diagnosis not present

## 2017-01-13 DIAGNOSIS — I87391 Chronic venous hypertension (idiopathic) with other complications of right lower extremity: Secondary | ICD-10-CM | POA: Diagnosis not present

## 2017-01-13 DIAGNOSIS — Z933 Colostomy status: Secondary | ICD-10-CM | POA: Diagnosis not present

## 2017-01-13 DIAGNOSIS — I482 Chronic atrial fibrillation: Secondary | ICD-10-CM | POA: Diagnosis not present

## 2017-02-05 ENCOUNTER — Ambulatory Visit (INDEPENDENT_AMBULATORY_CARE_PROVIDER_SITE_OTHER): Payer: Medicare Other | Admitting: *Deleted

## 2017-02-05 DIAGNOSIS — I4891 Unspecified atrial fibrillation: Secondary | ICD-10-CM

## 2017-02-05 DIAGNOSIS — Z5181 Encounter for therapeutic drug level monitoring: Secondary | ICD-10-CM | POA: Diagnosis not present

## 2017-02-05 DIAGNOSIS — Z7901 Long term (current) use of anticoagulants: Secondary | ICD-10-CM

## 2017-02-05 LAB — POCT INR: INR: 2.8

## 2017-02-25 DIAGNOSIS — H401131 Primary open-angle glaucoma, bilateral, mild stage: Secondary | ICD-10-CM | POA: Diagnosis not present

## 2017-02-25 DIAGNOSIS — Z961 Presence of intraocular lens: Secondary | ICD-10-CM | POA: Diagnosis not present

## 2017-03-19 ENCOUNTER — Ambulatory Visit (INDEPENDENT_AMBULATORY_CARE_PROVIDER_SITE_OTHER): Payer: Medicare Other | Admitting: *Deleted

## 2017-03-19 DIAGNOSIS — Z5181 Encounter for therapeutic drug level monitoring: Secondary | ICD-10-CM | POA: Diagnosis not present

## 2017-03-19 DIAGNOSIS — Z7901 Long term (current) use of anticoagulants: Secondary | ICD-10-CM

## 2017-03-19 DIAGNOSIS — I4891 Unspecified atrial fibrillation: Secondary | ICD-10-CM

## 2017-03-19 LAB — POCT INR: INR: 3.3

## 2017-04-02 ENCOUNTER — Ambulatory Visit (INDEPENDENT_AMBULATORY_CARE_PROVIDER_SITE_OTHER): Payer: Medicare Other | Admitting: *Deleted

## 2017-04-02 DIAGNOSIS — Z5181 Encounter for therapeutic drug level monitoring: Secondary | ICD-10-CM

## 2017-04-02 DIAGNOSIS — I4891 Unspecified atrial fibrillation: Secondary | ICD-10-CM | POA: Diagnosis not present

## 2017-04-02 DIAGNOSIS — Z7901 Long term (current) use of anticoagulants: Secondary | ICD-10-CM | POA: Diagnosis not present

## 2017-04-02 LAB — POCT INR: INR: 3.3

## 2017-04-17 DIAGNOSIS — I87391 Chronic venous hypertension (idiopathic) with other complications of right lower extremity: Secondary | ICD-10-CM | POA: Diagnosis not present

## 2017-04-17 DIAGNOSIS — C61 Malignant neoplasm of prostate: Secondary | ICD-10-CM | POA: Diagnosis not present

## 2017-04-17 DIAGNOSIS — Z1389 Encounter for screening for other disorder: Secondary | ICD-10-CM | POA: Diagnosis not present

## 2017-04-17 DIAGNOSIS — I10 Essential (primary) hypertension: Secondary | ICD-10-CM | POA: Diagnosis not present

## 2017-04-17 DIAGNOSIS — Z933 Colostomy status: Secondary | ICD-10-CM | POA: Diagnosis not present

## 2017-04-17 DIAGNOSIS — Z0000 Encounter for general adult medical examination without abnormal findings: Secondary | ICD-10-CM | POA: Diagnosis not present

## 2017-04-17 DIAGNOSIS — I482 Chronic atrial fibrillation: Secondary | ICD-10-CM | POA: Diagnosis not present

## 2017-04-23 ENCOUNTER — Ambulatory Visit (INDEPENDENT_AMBULATORY_CARE_PROVIDER_SITE_OTHER): Payer: Medicare Other | Admitting: *Deleted

## 2017-04-23 DIAGNOSIS — Z5181 Encounter for therapeutic drug level monitoring: Secondary | ICD-10-CM

## 2017-04-23 DIAGNOSIS — I4891 Unspecified atrial fibrillation: Secondary | ICD-10-CM

## 2017-04-23 DIAGNOSIS — Z7901 Long term (current) use of anticoagulants: Secondary | ICD-10-CM

## 2017-04-23 LAB — POCT INR: INR: 2.2

## 2017-05-21 ENCOUNTER — Ambulatory Visit (INDEPENDENT_AMBULATORY_CARE_PROVIDER_SITE_OTHER): Payer: Medicare Other | Admitting: *Deleted

## 2017-05-21 DIAGNOSIS — Z7901 Long term (current) use of anticoagulants: Secondary | ICD-10-CM | POA: Diagnosis not present

## 2017-05-21 DIAGNOSIS — Z23 Encounter for immunization: Secondary | ICD-10-CM

## 2017-05-21 DIAGNOSIS — I4891 Unspecified atrial fibrillation: Secondary | ICD-10-CM | POA: Diagnosis not present

## 2017-05-21 DIAGNOSIS — Z5181 Encounter for therapeutic drug level monitoring: Secondary | ICD-10-CM | POA: Diagnosis not present

## 2017-05-21 LAB — POCT INR: INR: 2.6

## 2017-06-09 DIAGNOSIS — H401131 Primary open-angle glaucoma, bilateral, mild stage: Secondary | ICD-10-CM | POA: Diagnosis not present

## 2017-06-18 ENCOUNTER — Ambulatory Visit (INDEPENDENT_AMBULATORY_CARE_PROVIDER_SITE_OTHER): Payer: Medicare Other | Admitting: *Deleted

## 2017-06-18 DIAGNOSIS — Z5181 Encounter for therapeutic drug level monitoring: Secondary | ICD-10-CM | POA: Diagnosis not present

## 2017-06-18 DIAGNOSIS — Z7901 Long term (current) use of anticoagulants: Secondary | ICD-10-CM | POA: Diagnosis not present

## 2017-06-18 DIAGNOSIS — I4891 Unspecified atrial fibrillation: Secondary | ICD-10-CM | POA: Diagnosis not present

## 2017-06-18 LAB — POCT INR: INR: 2.5

## 2017-07-17 DIAGNOSIS — Z933 Colostomy status: Secondary | ICD-10-CM | POA: Diagnosis not present

## 2017-07-17 DIAGNOSIS — Z6834 Body mass index (BMI) 34.0-34.9, adult: Secondary | ICD-10-CM | POA: Diagnosis not present

## 2017-07-17 DIAGNOSIS — I482 Chronic atrial fibrillation: Secondary | ICD-10-CM | POA: Diagnosis not present

## 2017-07-17 DIAGNOSIS — I87391 Chronic venous hypertension (idiopathic) with other complications of right lower extremity: Secondary | ICD-10-CM | POA: Diagnosis not present

## 2017-07-30 ENCOUNTER — Ambulatory Visit (INDEPENDENT_AMBULATORY_CARE_PROVIDER_SITE_OTHER): Payer: Medicare Other | Admitting: *Deleted

## 2017-07-30 DIAGNOSIS — Z5181 Encounter for therapeutic drug level monitoring: Secondary | ICD-10-CM | POA: Diagnosis not present

## 2017-07-30 DIAGNOSIS — Z7901 Long term (current) use of anticoagulants: Secondary | ICD-10-CM

## 2017-07-30 DIAGNOSIS — I4891 Unspecified atrial fibrillation: Secondary | ICD-10-CM | POA: Diagnosis not present

## 2017-07-30 LAB — POCT INR: INR: 2.7

## 2017-08-20 ENCOUNTER — Other Ambulatory Visit: Payer: Self-pay

## 2017-08-20 ENCOUNTER — Emergency Department (HOSPITAL_COMMUNITY): Payer: Medicare Other

## 2017-08-20 ENCOUNTER — Encounter (HOSPITAL_COMMUNITY): Payer: Self-pay

## 2017-08-20 ENCOUNTER — Inpatient Hospital Stay (HOSPITAL_COMMUNITY)
Admission: EM | Admit: 2017-08-20 | Discharge: 2017-08-23 | DRG: 086 | Disposition: A | Discharge status: Skilled Nursing Facility | Payer: Medicare Other | Attending: Internal Medicine | Admitting: Internal Medicine

## 2017-08-20 DIAGNOSIS — R404 Transient alteration of awareness: Secondary | ICD-10-CM | POA: Diagnosis not present

## 2017-08-20 DIAGNOSIS — Z9181 History of falling: Secondary | ICD-10-CM | POA: Diagnosis not present

## 2017-08-20 DIAGNOSIS — I480 Paroxysmal atrial fibrillation: Secondary | ICD-10-CM | POA: Diagnosis present

## 2017-08-20 DIAGNOSIS — N390 Urinary tract infection, site not specified: Secondary | ICD-10-CM | POA: Diagnosis not present

## 2017-08-20 DIAGNOSIS — W1830XA Fall on same level, unspecified, initial encounter: Secondary | ICD-10-CM | POA: Diagnosis present

## 2017-08-20 DIAGNOSIS — F4489 Other dissociative and conversion disorders: Secondary | ICD-10-CM | POA: Diagnosis not present

## 2017-08-20 DIAGNOSIS — Z7901 Long term (current) use of anticoagulants: Secondary | ICD-10-CM

## 2017-08-20 DIAGNOSIS — R279 Unspecified lack of coordination: Secondary | ICD-10-CM | POA: Diagnosis not present

## 2017-08-20 DIAGNOSIS — I609 Nontraumatic subarachnoid hemorrhage, unspecified: Secondary | ICD-10-CM

## 2017-08-20 DIAGNOSIS — I608 Other nontraumatic subarachnoid hemorrhage: Secondary | ICD-10-CM | POA: Diagnosis not present

## 2017-08-20 DIAGNOSIS — Z433 Encounter for attention to colostomy: Secondary | ICD-10-CM | POA: Diagnosis not present

## 2017-08-20 DIAGNOSIS — R0989 Other specified symptoms and signs involving the circulatory and respiratory systems: Secondary | ICD-10-CM | POA: Diagnosis not present

## 2017-08-20 DIAGNOSIS — R4182 Altered mental status, unspecified: Secondary | ICD-10-CM | POA: Diagnosis present

## 2017-08-20 DIAGNOSIS — Z9049 Acquired absence of other specified parts of digestive tract: Secondary | ICD-10-CM

## 2017-08-20 DIAGNOSIS — M6281 Muscle weakness (generalized): Secondary | ICD-10-CM | POA: Diagnosis not present

## 2017-08-20 DIAGNOSIS — Z5189 Encounter for other specified aftercare: Secondary | ICD-10-CM | POA: Diagnosis not present

## 2017-08-20 DIAGNOSIS — H409 Unspecified glaucoma: Secondary | ICD-10-CM | POA: Diagnosis not present

## 2017-08-20 DIAGNOSIS — Y92009 Unspecified place in unspecified non-institutional (private) residence as the place of occurrence of the external cause: Secondary | ICD-10-CM

## 2017-08-20 DIAGNOSIS — Z8051 Family history of malignant neoplasm of kidney: Secondary | ICD-10-CM

## 2017-08-20 DIAGNOSIS — Z85038 Personal history of other malignant neoplasm of large intestine: Secondary | ICD-10-CM

## 2017-08-20 DIAGNOSIS — R262 Difficulty in walking, not elsewhere classified: Secondary | ICD-10-CM | POA: Diagnosis not present

## 2017-08-20 DIAGNOSIS — R600 Localized edema: Secondary | ICD-10-CM | POA: Diagnosis present

## 2017-08-20 DIAGNOSIS — Z933 Colostomy status: Secondary | ICD-10-CM | POA: Diagnosis not present

## 2017-08-20 DIAGNOSIS — S066X0A Traumatic subarachnoid hemorrhage without loss of consciousness, initial encounter: Secondary | ICD-10-CM | POA: Diagnosis not present

## 2017-08-20 DIAGNOSIS — R531 Weakness: Secondary | ICD-10-CM | POA: Diagnosis not present

## 2017-08-20 DIAGNOSIS — Z800 Family history of malignant neoplasm of digestive organs: Secondary | ICD-10-CM

## 2017-08-20 DIAGNOSIS — J9601 Acute respiratory failure with hypoxia: Secondary | ICD-10-CM | POA: Diagnosis not present

## 2017-08-20 DIAGNOSIS — I10 Essential (primary) hypertension: Secondary | ICD-10-CM | POA: Diagnosis present

## 2017-08-20 LAB — COMPREHENSIVE METABOLIC PANEL
ALT: 16 U/L — ABNORMAL LOW (ref 17–63)
AST: 24 U/L (ref 15–41)
Albumin: 3.2 g/dL — ABNORMAL LOW (ref 3.5–5.0)
Alkaline Phosphatase: 93 U/L (ref 38–126)
Anion gap: 7 (ref 5–15)
BUN: 15 mg/dL (ref 6–20)
CHLORIDE: 105 mmol/L (ref 101–111)
CO2: 24 mmol/L (ref 22–32)
Calcium: 8.7 mg/dL — ABNORMAL LOW (ref 8.9–10.3)
Creatinine, Ser: 0.9 mg/dL (ref 0.61–1.24)
Glucose, Bld: 101 mg/dL — ABNORMAL HIGH (ref 65–99)
POTASSIUM: 3.7 mmol/L (ref 3.5–5.1)
Sodium: 136 mmol/L (ref 135–145)
Total Bilirubin: 0.6 mg/dL (ref 0.3–1.2)
Total Protein: 7.6 g/dL (ref 6.5–8.1)

## 2017-08-20 LAB — URINALYSIS, ROUTINE W REFLEX MICROSCOPIC
BILIRUBIN URINE: NEGATIVE
Bacteria, UA: NONE SEEN
Glucose, UA: NEGATIVE mg/dL
Ketones, ur: NEGATIVE mg/dL
NITRITE: POSITIVE — AB
PH: 7 (ref 5.0–8.0)
Protein, ur: 30 mg/dL — AB
SPECIFIC GRAVITY, URINE: 1.015 (ref 1.005–1.030)

## 2017-08-20 LAB — CBC WITH DIFFERENTIAL/PLATELET
BASOS ABS: 0 10*3/uL (ref 0.0–0.1)
Basophils Relative: 0 %
EOS PCT: 1 %
Eosinophils Absolute: 0.1 10*3/uL (ref 0.0–0.7)
HCT: 40.6 % (ref 39.0–52.0)
Hemoglobin: 13.4 g/dL (ref 13.0–17.0)
Lymphocytes Relative: 6 %
Lymphs Abs: 1.1 10*3/uL (ref 0.7–4.0)
MCH: 32.1 pg (ref 26.0–34.0)
MCHC: 33 g/dL (ref 30.0–36.0)
MCV: 97.4 fL (ref 78.0–100.0)
MONO ABS: 1.4 10*3/uL — AB (ref 0.1–1.0)
Monocytes Relative: 8 %
Neutro Abs: 13.9 10*3/uL — ABNORMAL HIGH (ref 1.7–7.7)
Neutrophils Relative %: 85 %
PLATELETS: 282 10*3/uL (ref 150–400)
RBC: 4.17 MIL/uL — ABNORMAL LOW (ref 4.22–5.81)
RDW: 13.1 % (ref 11.5–15.5)
WBC: 16.5 10*3/uL — ABNORMAL HIGH (ref 4.0–10.5)

## 2017-08-20 LAB — PROTIME-INR
INR: 1.91
Prothrombin Time: 21.7 seconds — ABNORMAL HIGH (ref 11.4–15.2)

## 2017-08-20 LAB — ABO/RH: ABO/RH(D): O POS

## 2017-08-20 LAB — TROPONIN I

## 2017-08-20 MED ORDER — SODIUM CHLORIDE 0.9 % IV SOLN: Freq: Once | INTRAVENOUS | Status: DC

## 2017-08-20 MED ORDER — VITAMIN K1 10 MG/ML IJ SOLN
10.0000 mg | Freq: Once | INTRAMUSCULAR | Status: AC
  Administered 2017-08-20: 10 mg via SUBCUTANEOUS
  Filled 2017-08-20: qty 1

## 2017-08-20 MED ORDER — SODIUM CHLORIDE 0.9 % IV BOLUS (SEPSIS)
500.0000 mL | Freq: Once | INTRAVENOUS | Status: AC
  Administered 2017-08-20: 500 mL via INTRAVENOUS

## 2017-08-20 NOTE — ED Notes (Signed)
Have given pt water to drink  

## 2017-08-20 NOTE — ED Provider Notes (Addendum)
[facility] EMERGENCY DEPARTMENT Provider Note   CSN: [id number] Arrival date & time: 08/20/17  1845     History   Chief Complaint Chief Complaint  Patient presents with  . Altered Mental Status  . Extremity Weakness    HPI Joel Bishop is a 84 y.o. male.  Level 5 caveat for altered mental status.  Most of history obtained from sister who patient lives with.  Sister states the patient has been repetitively asking the same question today.  Additionally, he normally has a dry personality, but today he is very playful.  He is weak and unable to stand on his legs.  No prodromal illnesses.  No fever, sweats, chills.      Past Medical History:  Diagnosis Date  . Chronic anticoagulation 2002  . Colon carcinoma ([place]) 1998   s/p partial colectomy and permanent colostomyin 1998  . Glaucoma   . Hypertension   . Overweight(278.02)   . Paroxysmal atrial fibrillation ([place]) 2002   Single episode by history during a noncardiac illness    Patient Active Problem List   Diagnosis Date Noted  . Encounter for therapeutic drug monitoring 09/27/2013  . Laboratory test 07/08/2011  . Colon carcinoma ([place])   . Hypertension   . Atrial fibrillation ([place]) 05/29/2011  . Chronic anticoagulation 05/29/2011    Past Surgical History:  Procedure Laterality Date  . ABDOMINOPERINEAL PROCTOCOLECTOMY  1998   colon carcinoma; permanent colostomy  . CATARACT EXTRACTION     Bilateral  . COLONOSCOPY  11/2007   Dr. Juanda Crumble [facility], via ostomy. unremarkable.       Home Medications    Prior to Admission medications   Medication Sig Start Date End Date Taking? Authorizing Provider  diltiazem (CARDIZEM) 90 MG tablet Take 90 mg by mouth 3 (three) times daily.     [provider]  latanoprost (XALATAN) 0.005 % ophthalmic solution Place 1 drop into both eyes at bedtime.     [provider]  timolol (BETIMOL) 0.25 % ophthalmic solution Place 1-2 drops  into the left eye 2 (two) times daily.     [provider]  warfarin (COUMADIN) 3 MG tablet TAKE ONE-HALF TABLET BY MOUTH ONCE DAILY EXCEPT ONE TABLET ONCE DAILY ON MONDAYS 11/04/16   Arnoldo Lenis, MD    Family History Family History  Problem Relation Age of Onset  . Cancer Mother        gallbladder  . Uterine cancer Maternal Aunt   . Pancreatic cancer Brother   . Liver cancer Sister   . Other Brother        amylodosis  . Kidney cancer Sister     Social History Social History   Tobacco Use  . Smoking status: Never Smoker  . Smokeless tobacco: Never Used  Substance Use Topics  . Alcohol use: No    Alcohol/week: 0.0 oz  . Drug use: No     Allergies   Patient has no known allergies.   Review of Systems Review of Systems  Unable to perform ROS: Mental status change  All other systems reviewed and are negative.    Physical Exam Updated Vital Signs BP 119/78   Pulse (!) 122   Resp (!) 21   Ht 5\' 6"  (1.676 m)   Wt 100.7 kg (222 lb)   SpO2 95%   BMI 35.83 kg/m   Physical Exam  Constitutional: He is oriented to person, place, and time.  Pleasant, cooperative  HENT:  Head: Normocephalic  and atraumatic.  Eyes: Conjunctivae are normal.  Neck: Neck supple.  Cardiovascular: Normal rate and regular rhythm.  Pulmonary/Chest: Effort normal and breath sounds normal.  Abdominal: Soft. Bowel sounds are normal.  colostomy  Musculoskeletal: Normal range of motion.  Neurological: He is alert and oriented to person, place, and time.  Skin: Skin is warm and dry.  Psychiatric:  No obvious confusion.  Nursing note and vitals reviewed.    ED Treatments / Results  Labs (all labs ordered are listed, but only abnormal results are displayed) Labs Reviewed  CBC WITH DIFFERENTIAL/PLATELET - Abnormal; Notable for the following components:      Result Value   WBC 16.5 (*)    RBC 4.17 (*)    Neutro Abs 13.9 (*)    Monocytes Absolute 1.4 (*)    All other  components within normal limits  COMPREHENSIVE METABOLIC PANEL - Abnormal; Notable for the following components:   Glucose, Bld 101 (*)    Calcium 8.7 (*)    Albumin 3.2 (*)    ALT 16 (*)    All other components within normal limits  TROPONIN I  URINALYSIS, ROUTINE W REFLEX MICROSCOPIC  PROTIME-INR    EKG  EKG Interpretation  Date/Time:  Wednesday August 20 2017 18:54:35 EST Ventricular Rate:  109 PR Interval:    QRS Duration: 73 QT Interval:  318 QTC Calculation: 429 R Axis:   76 Text Interpretation:  Atrial fibrillation Aberrant conduction of SV complex(es) Low voltage, precordial leads Confirmed by Nat Christen [phone]) on 08/20/2017 8:54:47 PM       Radiology Dg Chest 1 View  Result Date: 08/20/2017 CLINICAL DATA:  Generalized weakness EXAM: CHEST 1 VIEW COMPARISON:  None. FINDINGS: Cardiomegaly and vascular pedicle widening. Congested appearance of vessels without Kerley lines or effusion. No pneumothorax or air bronchogram. Artifact from EKG leads. IMPRESSION: Cardiomegaly and vascular congestion accentuated by low volumes. Electronically Signed   By: Monte Fantasia M.D.   On: 08/20/2017 20:04   Ct Head Wo Contrast  Result Date: 08/20/2017 CLINICAL DATA:  84 year old male with history of confusion today. Swelling in both legs. EXAM: CT HEAD WITHOUT CONTRAST TECHNIQUE: Contiguous axial images were obtained from the base of the skull through the vertex without intravenous contrast. COMPARISON:  No priors. FINDINGS: Brain: Small amount of high attenuation in the anterior aspect of the interhemispheric fissure best appreciated on axial images 20 and 21 and coronal images 25 through 29. Patchy and confluent areas of decreased attenuation are noted throughout the deep and periventricular white matter of the cerebral hemispheres bilaterally, compatible with chronic microvascular ischemic disease. Moderate to severe cerebral atrophy with associated ex vacuo dilatation of the ventricular  system. No evidence of acute infarction, mass or mass effect. Vascular: No hyperdense vessel or unexpected calcification. Skull: Normal. Negative for fracture or focal lesion. Sinuses/Orbits: No acute finding. Other: None. IMPRESSION: 1. Small amount of subarachnoid hemorrhage noted in the anterior interhemispheric fissure. 2. Moderate to severe cerebral atrophy with probable ex vacuo dilatation of the ventricular system. The possibility of normal pressure hydrocephalus (NPH) is not entirely excluded. 3. Extensive chronic microvascular ischemic changes throughout the cerebral white matter, as above. Critical Value/emergent results were called by telephone at the time of interpretation on 08/20/2017 at 8:23 pm to Dr. Nat Christen, who verbally acknowledged these results. Electronically Signed   By: Vinnie Langton M.D.   On: 08/20/2017 20:23    Procedures Procedures (including critical care time)  Medications Ordered in ED Medications  sodium  chloride 0.9 % bolus 500 mL (500 mLs Intravenous New Bag/Given 08/20/17 1933)     Initial Impression / Assessment and Plan / ED Course  I have reviewed the triage vital signs and the nursing notes.  Pertinent labs & imaging results that were available during my care of the patient were reviewed by me and considered in my medical decision making (see chart for details).     Patient presents with altered mental status per his sister's history.  His neuro exam is grossly abnormal.  CT head reveals a small amount of subarachnoid hemorrhage in the anterior interhemispheric fissure.  These findings were discussed with the neurosurgeon on-call.  He recommended reversal of the Coumadin via vitamin K and fresh frozen plasma, repeat head CT in approximately 24 hours, potential discharge on Friday if patient remains stable.  No further neurosurgery intervention is needed either inpatient or outpatient (unless clinical scenario changes).  Discussed with Dr. Darrick Meigs.  He will  admit.   CRITICAL CARE Performed by: Nat Christen Total critical care time: 40 minutes Critical care time was exclusive of separately billable procedures and treating other patients. Critical care was necessary to treat or prevent imminent or life-threatening deterioration. Critical care was time spent personally by me on the following activities: development of treatment plan with patient and/or surrogate as well as nursing, discussions with consultants, evaluation of patient's response to treatment, examination of patient, obtaining history from patient or surrogate, ordering and performing treatments and interventions, ordering and review of laboratory studies, ordering and review of radiographic studies, pulse oximetry and re-evaluation of patient's condition.  Final Clinical Impressions(s) / ED Diagnoses   Final diagnoses:  SAH (subarachnoid hemorrhage) [facility]    ED Discharge Orders    None       Nat Christen, MD 08/20/17 3710    Nat Christen, MD 08/20/17 2209

## 2017-08-20 NOTE — H&P (Addendum)
TRH H&P    Patient Demographics:    Zaydenn Balaguer, is a 84 y.o. male  MRN: [id number]  DOB - [birth year]  Admit Date - 08/20/2017  Referring MD/NP/PA: Dr Lacinda Axon  Outpatient Primary MD for the patient is Rosita Fire, MD  Patient coming from: home  Chief Complaint  Patient presents with  . Altered Mental Status  . Extremity Weakness      HPI:    Arsenio Schnorr  is a 84 y.o. male, with history of colon carcinoma status post partial colectomy and permanent colostomy in 1998, atrial fibrillation, on chronic anticoagulation with Coumadin, hypertension came to hospital with complaints of fall. As per patient sister patient became extremely weak and fell, he was unable to get up. Patient denies passing out. Denies hitting his head. Denies chest pain or shortness of breath. No nausea vomiting or diarrhea.  In the ED, CT of the head showed small amount of subarachnoid hemorrhage noted in the anterior  hemispheric fissure. Neurosurgery, Dr Rita Ohara was consulted by the ED physician, and he recommended no surgical intervention at this time. Also recommended to hold Coumadin, patient given vitamin K and FFP in the ED.  Also found to have abnormal urine, started on IV ceftriaxone. Urine culture obtained in the ED   Review of systems:      All other systems reviewed and are negative.   With Past History of the following :    Past Medical History:  Diagnosis Date  . Chronic anticoagulation 2002  . Colon carcinoma ([place]) 1998   s/p partial colectomy and permanent colostomyin 1998  . Glaucoma   . Hypertension   . Overweight(278.02)   . Paroxysmal atrial fibrillation ([place]) 2002   Single episode by history during a noncardiac illness      Past Surgical History:  Procedure Laterality Date  . ABDOMINOPERINEAL PROCTOCOLECTOMY  1998   colon carcinoma; permanent colostomy  . CATARACT EXTRACTION     Bilateral  . COLONOSCOPY  11/2007   Dr. Juanda Crumble Mueller/[facility], via ostomy. unremarkable.      Social History:      Social History   Tobacco Use  . Smoking status: Never Smoker  . Smokeless tobacco: Never Used  Substance Use Topics  . Alcohol use: No    Alcohol/week: 0.0 oz       Family History :     Family History  Problem Relation Age of Onset  . Cancer Mother        gallbladder  . Uterine cancer Maternal Aunt   . Pancreatic cancer Brother   . Liver cancer Sister   . Other Brother        amylodosis  . Kidney cancer Sister       Home Medications:   Prior to Admission medications   Medication Sig Start Date End Date Taking? Authorizing Provider  diltiazem (CARDIZEM) 90 MG tablet Take 90 mg by mouth 3 (three) times daily.    Yes [provider]  latanoprost (XALATAN) 0.005 % ophthalmic solution Place 1 drop into both  eyes at bedtime.    Yes [provider]  timolol (BETIMOL) 0.25 % ophthalmic solution Place 1-2 drops into the left eye 2 (two) times daily.    Yes [provider]  warfarin (COUMADIN) 3 MG tablet TAKE ONE-HALF TABLET BY MOUTH ONCE DAILY EXCEPT ONE TABLET ONCE DAILY ON MONDAYS Patient taking differently: TAKE ONE-HALF TABLET (1.5mg ) BY MOUTH ONCE DAILY 11/04/16  Yes Branch, Alphonse Guild, MD     Allergies:    No Known Allergies   Physical Exam:   Vitals  Blood pressure 111/76, pulse 66, resp. rate (!) 22, height 5\' 6"  (1.676 m), weight 100.7 kg (222 lb), SpO2 93 %.  1.  General: Appears in no acute distress  2. Psychiatric:  Intact judgement and  insight, awake alert, oriented x 3.  3. Neurologic: No focal neurological deficits, all cranial nerves intact.Strength 5/5 all 4 extremities, sensation intact all 4 extremities, plantars down going.  4. Eyes :  anicteric sclerae, moist conjunctivae with no lid lag. PERRLA.  5. ENMT:  Oropharynx clear with moist mucous membranes and good  dentition  6. Neck:  supple, no cervical lymphadenopathy appriciated, No thyromegaly  7. Respiratory : Normal respiratory effort, good air movement bilaterally,clear to  auscultation bilaterally  8. Cardiovascular : RRR, no gallops, rubs or murmurs, bilateral 2+ pitting edema of the lower extremities  9. Gastrointestinal:  Positive bowel sounds, abdomen soft, non-tender to palpation,no hepatosplenomegaly, no rigidity or guarding       10. Skin:  No cyanosis, normal texture and turgor, no rash, lesions or ulcers  11.Musculoskeletal:  Good muscle tone,  joints appear normal , no effusions,  normal range of motion    Data Review:    CBC Recent Labs  Lab 08/20/17 1900  WBC 16.5*  HGB 13.4  HCT 40.6  PLT 282  MCV 97.4  MCH 32.1  MCHC 33.0  RDW 13.1  LYMPHSABS 1.1  MONOABS 1.4*  EOSABS 0.1  BASOSABS 0.0   ------------------------------------------------------------------------------------------------------------------  Chemistries  Recent Labs  Lab 08/20/17 1900  NA 136  K 3.7  CL 105  CO2 24  GLUCOSE 101*  BUN 15  CREATININE 0.90  CALCIUM 8.7*  AST 24  ALT 16*  ALKPHOS 93  BILITOT 0.6   ------------------------------------------------------------------------------------------------------------------  ------------------------------------------------------------------------------------------------------------------ GFR: Estimated Creatinine Clearance: 64.3 mL/min (by C-G formula based on SCr of 0.9 mg/dL). Liver Function Tests: Recent Labs  Lab 08/20/17 1900  AST 24  ALT 16*  ALKPHOS 93  BILITOT 0.6  PROT 7.6  ALBUMIN 3.2*   No results for input(s): LIPASE, AMYLASE in the last 168 hours. No results for input(s): AMMONIA in the last 168 hours. Coagulation Profile: Recent Labs  Lab 08/20/17 1900  INR 1.91   Cardiac Enzymes: Recent Labs  Lab 08/20/17 1900  TROPONINI <0.03     --------------------------------------------------------------------------------------------------------------- Urine analysis:    Component Value Date/Time   COLORURINE YELLOW 08/20/2017 1910   APPEARANCEUR CLOUDY (A) 08/20/2017 1910   LABSPEC 1.015 08/20/2017 1910   PHURINE 7.0 08/20/2017 1910   GLUCOSEU NEGATIVE 08/20/2017 1910   HGBUR SMALL (A) 08/20/2017 1910   BILIRUBINUR NEGATIVE 08/20/2017 1910   KETONESUR NEGATIVE 08/20/2017 1910   PROTEINUR 30 (A) 08/20/2017 1910   NITRITE POSITIVE (A) 08/20/2017 1910   LEUKOCYTESUR LARGE (A) 08/20/2017 1910      Imaging Results:    Dg Chest 1 View  Result Date: 08/20/2017 CLINICAL DATA:  Generalized weakness EXAM: CHEST 1 VIEW COMPARISON:  None. FINDINGS: Cardiomegaly and vascular pedicle widening.  Congested appearance of vessels without Kerley lines or effusion. No pneumothorax or air bronchogram. Artifact from EKG leads. IMPRESSION: Cardiomegaly and vascular congestion accentuated by low volumes. Electronically Signed   By: Monte Fantasia M.D.   On: 08/20/2017 20:04   Ct Head Wo Contrast  Result Date: 08/20/2017 CLINICAL DATA:  84 year old male with history of confusion today. Swelling in both legs. EXAM: CT HEAD WITHOUT CONTRAST TECHNIQUE: Contiguous axial images were obtained from the base of the skull through the vertex without intravenous contrast. COMPARISON:  No priors. FINDINGS: Brain: Small amount of high attenuation in the anterior aspect of the interhemispheric fissure best appreciated on axial images 20 and 21 and coronal images 25 through 29. Patchy and confluent areas of decreased attenuation are noted throughout the deep and periventricular white matter of the cerebral hemispheres bilaterally, compatible with chronic microvascular ischemic disease. Moderate to severe cerebral atrophy with associated ex vacuo dilatation of the ventricular system. No evidence of acute infarction, mass or mass effect. Vascular: No hyperdense  vessel or unexpected calcification. Skull: Normal. Negative for fracture or focal lesion. Sinuses/Orbits: No acute finding. Other: None. IMPRESSION: 1. Small amount of subarachnoid hemorrhage noted in the anterior interhemispheric fissure. 2. Moderate to severe cerebral atrophy with probable ex vacuo dilatation of the ventricular system. The possibility of normal pressure hydrocephalus (NPH) is not entirely excluded. 3. Extensive chronic microvascular ischemic changes throughout the cerebral white matter, as above. Critical Value/emergent results were called by telephone at the time of interpretation on 08/20/2017 at 8:23 pm to Dr. Nat Christen, who verbally acknowledged these results. Electronically Signed   By: Vinnie Langton M.D.   On: 08/20/2017 20:23    My personal review of EKG: Rhythm- atrial fibrillation   Assessment & Plan:    Active Problems:   SAH (subarachnoid hemorrhage) (HCC)  UTI   Atrial fibrillation   Bilateral lower extremity edema   1. Subarachnoid hemorrhage-patient has small amount of SH in the anterior intra-hemispheric fissure, neurosurgeon Dr. Rita Ohara consulted by ED physician. He recommended no surgical intervention at this time. Hold Coumadin,  Repeat CT head in 24 hours.will obtain neuro- checks Q4 hours. If no change ,patient does not require follow-up with neurosurgeon. Patient received vitamin K and FFP in the ED. 2. UTI-patient found to have abnormal UA, started on IV subtraction. Urine culture ordered in the ED. 3. Atrial fibrillation- continue Cardizem, will hold anticoagulation with Coumadin due to above. 4. Bilateral lower extremity edema- patient has bilateral lower extremity edema and  vascular congestion on chest x-ray. Will obtain echocardiogram in a.m.    DVT Prophylaxis-   SCDs   AM Labs Ordered, also please review Full Orders  Family Communication: Admission, patients condition and plan of care including tests being ordered have been discussed with  the patient and his sister at bedside who indicate understanding and agree with the plan and Code Status.  Code Status: full code  Admission status: inpatient  Time spent in minutes : 60 min   Oswald Hillock M.D on 08/20/2017 at 11:07 PM  Between 7am to 7pm - Pager - [phone]. After 7pm go to [email or web address] - password [facility]  [facility] - Office  [phone]

## 2017-08-20 NOTE — ED Notes (Signed)
Admitting MD at bedside.

## 2017-08-20 NOTE — ED Triage Notes (Addendum)
Pt.'s sister called EMS. Reports patient has AMS. Per sister, patient kept asking the same questions over and over.  Pt has a colostomy bag  NAD VSS CBG 121 116/67

## 2017-08-20 NOTE — ED Notes (Signed)
Signature for Patient's consent form has been signed. Will put in folder and give to next shift RN

## 2017-08-20 NOTE — ED Triage Notes (Signed)
Pt.'s sister has come into the room and given another reason as to why EMS was called. Pt has swelling in bilateral legs. States patient was not able to stand on both legs. He was very weak which is not normal.

## 2017-08-21 ENCOUNTER — Inpatient Hospital Stay (HOSPITAL_COMMUNITY): Payer: Medicare Other

## 2017-08-21 DIAGNOSIS — J9601 Acute respiratory failure with hypoxia: Secondary | ICD-10-CM

## 2017-08-21 DIAGNOSIS — I609 Nontraumatic subarachnoid hemorrhage, unspecified: Secondary | ICD-10-CM

## 2017-08-21 DIAGNOSIS — R531 Weakness: Secondary | ICD-10-CM

## 2017-08-21 DIAGNOSIS — I480 Paroxysmal atrial fibrillation: Secondary | ICD-10-CM

## 2017-08-21 LAB — COMPREHENSIVE METABOLIC PANEL
ALT: 13 U/L — AB (ref 17–63)
AST: 20 U/L (ref 15–41)
Albumin: 2.8 g/dL — ABNORMAL LOW (ref 3.5–5.0)
Alkaline Phosphatase: 81 U/L (ref 38–126)
Anion gap: 9 (ref 5–15)
BUN: 13 mg/dL (ref 6–20)
CHLORIDE: 103 mmol/L (ref 101–111)
CO2: 23 mmol/L (ref 22–32)
CREATININE: 0.92 mg/dL (ref 0.61–1.24)
Calcium: 8.5 mg/dL — ABNORMAL LOW (ref 8.9–10.3)
Glucose, Bld: 97 mg/dL (ref 65–99)
POTASSIUM: 4.2 mmol/L (ref 3.5–5.1)
Sodium: 135 mmol/L (ref 135–145)
TOTAL PROTEIN: 6.6 g/dL (ref 6.5–8.1)
Total Bilirubin: 0.9 mg/dL (ref 0.3–1.2)

## 2017-08-21 LAB — ECHOCARDIOGRAM COMPLETE
CHL CUP STROKE VOLUME: 43 mL
EWDT: 162 ms
FS: 25 % — AB (ref 28–44)
HEIGHTINCHES: 66 in
IVS/LV PW RATIO, ED: 0.96
LA diam index: 1.77 cm/m2
LA vol index: 25.5 mL/m2
LA vol: 56.2 mL
LASIZE: 39 mm
LAVOLA4C: 60.6 mL
LDCA: 3.14 cm2
LEFT ATRIUM END SYS DIAM: 39 mm
LV sys vol index: 8 mL/m2
LVDIAVOL: 60 mL — AB (ref 62–150)
LVDIAVOLIN: 27 mL/m2
LVOT SV: 42 mL
LVOT VTI: 13.4 cm
LVOT diameter: 20 mm
LVOT peak grad rest: 3 mmHg
LVOT peak vel: 93.2 cm/s
LVSYSVOL: 17 mL — AB
MV Dec: 162
MV Peak grad: 9 mmHg
MV pk E vel: 146 m/s
PW: 12 mm — AB (ref 0.6–1.1)
Simpson's disk: 71
TAPSE: 13.1 mm
WEIGHTICAEL: 3552 [oz_av]

## 2017-08-21 LAB — CBC
HCT: 37.2 % — ABNORMAL LOW (ref 39.0–52.0)
Hemoglobin: 12.1 g/dL — ABNORMAL LOW (ref 13.0–17.0)
MCH: 31.8 pg (ref 26.0–34.0)
MCHC: 32.5 g/dL (ref 30.0–36.0)
MCV: 97.9 fL (ref 78.0–100.0)
PLATELETS: 249 10*3/uL (ref 150–400)
RBC: 3.8 MIL/uL — ABNORMAL LOW (ref 4.22–5.81)
RDW: 13 % (ref 11.5–15.5)
WBC: 15.6 10*3/uL — ABNORMAL HIGH (ref 4.0–10.5)

## 2017-08-21 MED ORDER — SODIUM CHLORIDE 0.9% FLUSH
3.0000 mL | Freq: Two times a day (BID) | INTRAVENOUS | Status: DC
  Administered 2017-08-21 – 2017-08-23 (×4): 3 mL via INTRAVENOUS

## 2017-08-21 MED ORDER — SODIUM CHLORIDE 0.9 % IV SOLN: Freq: Once | INTRAVENOUS | Status: DC

## 2017-08-21 MED ORDER — SODIUM CHLORIDE 0.9 % IV SOLN: INTRAVENOUS | Status: DC

## 2017-08-21 MED ORDER — SODIUM CHLORIDE 0.9 % IV SOLN: 250.0000 mL | INTRAVENOUS | Status: DC | PRN

## 2017-08-21 MED ORDER — DEXTROSE 5 % IV SOLN
1.0000 g | INTRAVENOUS | Status: DC
  Administered 2017-08-21 – 2017-08-23 (×3): 1 g via INTRAVENOUS
  Filled 2017-08-21 (×6): qty 10

## 2017-08-21 MED ORDER — ACETAMINOPHEN 325 MG PO TABS: 650.0000 mg | ORAL_TABLET | Freq: Four times a day (QID) | ORAL | Status: DC | PRN

## 2017-08-21 MED ORDER — ACETAMINOPHEN 650 MG RE SUPP: 650.0000 mg | Freq: Four times a day (QID) | RECTAL | Status: DC | PRN

## 2017-08-21 MED ORDER — TIMOLOL MALEATE 0.25 % OP SOLN
1.0000 [drp] | Freq: Two times a day (BID) | OPHTHALMIC | Status: DC
  Administered 2017-08-21 – 2017-08-23 (×5): 1 [drp] via OPHTHALMIC
  Filled 2017-08-21: qty 5

## 2017-08-21 MED ORDER — TIMOLOL HEMIHYDRATE 0.25 % OP SOLN
1.0000 [drp] | Freq: Two times a day (BID) | OPHTHALMIC | Status: DC
  Filled 2017-08-21: qty 5

## 2017-08-21 MED ORDER — LATANOPROST 0.005 % OP SOLN
1.0000 [drp] | Freq: Every day | OPHTHALMIC | Status: DC
  Administered 2017-08-21 – 2017-08-22 (×2): 1 [drp] via OPHTHALMIC
  Filled 2017-08-21: qty 2.5

## 2017-08-21 MED ORDER — DILTIAZEM HCL 60 MG PO TABS
90.0000 mg | ORAL_TABLET | Freq: Three times a day (TID) | ORAL | Status: DC
  Administered 2017-08-21 – 2017-08-22 (×5): 90 mg via ORAL
  Filled 2017-08-21 (×5): qty 1

## 2017-08-21 MED ORDER — SODIUM CHLORIDE 0.9% FLUSH
3.0000 mL | INTRAVENOUS | Status: DC | PRN
Start: 2017-08-21 — End: 2017-08-23
  Administered 2017-08-21: 3 mL via INTRAVENOUS
  Filled 2017-08-21: qty 3

## 2017-08-21 MED ORDER — ONDANSETRON HCL 4 MG/2ML IJ SOLN: 4.0000 mg | Freq: Four times a day (QID) | INTRAMUSCULAR | Status: DC | PRN

## 2017-08-21 MED ORDER — ONDANSETRON HCL 4 MG PO TABS
4.0000 mg | ORAL_TABLET | Freq: Four times a day (QID) | ORAL | Status: DC | PRN
  Administered 2017-08-22: 4 mg via ORAL
  Filled 2017-08-21: qty 1

## 2017-08-21 NOTE — Progress Notes (Signed)
PROGRESS NOTE    Joel Bishop  QPY:[id number] DOB: 07-11-1928 DOA: 08/20/2017 PCP: Rosita Fire, MD     Brief Narrative:  84 year old man admitted from home on 1/9 following a fall.  He complains of weakness, was unable to get up, he denies hitting his head or passing out.  He has a history of colon cancer status post partial colectomy and permanent colostomy, A. fib on chronic anticoagulation with Coumadin.  In the ED a CT scan of the head showed a small amount of subarachnoid hemorrhage, EDP consulted with neurosurgery, Dr. Sherwood Gambler who recommended no surgical intervention and to repeat CT head in the morning.  Also found to have a UTI and is on Rocephin.   Assessment & Plan:   Active Problems:   SAH (subarachnoid hemorrhage) (HCC)   Subarachnoid hemorrhage -Repeat CT scan 24 hours later shows no change in amount of subarachnoid hemorrhage. -As per phone consultation with neurosurgery, no further intervention required. -Given his fall with weakness will request PT evaluation for discharge planning. -At this point, we will keep off Coumadin indefinitely.  UTI -Continue Rocephin pending culture data, can transition to Cipro at time of discharge.  Atrial fibrillation -Continue Cardizem for rate control, off Coumadin indefinitely due to subarachnoid hemorrhage.   DVT prophylaxis: SCDs Code Status: Full code Family Communication: Patient only Disposition Plan: To be determined pending PT evaluation, anticipate will be medically stable for discharge in 24 hours.  Consultants:   Phone consultation with neurosurgery, Dr. Sherwood Gambler  Procedures:   Echo: Ejection fraction of 60-65%, no wall motion abnormalities, not technically sufficient to allow evaluation of LV diastolic function.  Trivial mitral regurgitation.  Moderately sclerotic aortic valve, small circumferential pericardial effusion with no right ventricular compromise.  Antimicrobials:  Anti-infectives (From admission,  onward)   Start     Dose/Rate Route Frequency Ordered Stop   08/21/17 0045  cefTRIAXone (ROCEPHIN) 1 g in dextrose 5 % 50 mL IVPB     1 g 100 mL/hr over 30 Minutes Intravenous Every 24 hours 08/21/17 0045         Subjective: Other than weakness feels well, denies chest pain or shortness of breath.  Objective: Vitals:   08/20/17 2330 08/21/17 0241 08/21/17 0256 08/21/17 0500  BP: 102/72 129/75 126/66 (!) 121/53  Pulse: (!) 111   (!) 122  Resp: 18 20 20  (!) 24  Temp:  99.4 F (37.4 C) 100 F (37.8 C) 100.1 F (37.8 C)  TempSrc:  Oral Oral Oral  SpO2: 90% 93% 94% 93%  Weight:      Height:        Intake/Output Summary (Last 24 hours) at 08/21/2017 1856 Last data filed at 08/21/2017 1701 Gross per 24 hour  Intake 1497 ml  Output 1725 ml  Net -228 ml   Filed Weights   08/20/17 1849  Weight: 100.7 kg (222 lb)    Examination:  General exam: Alert, awake, oriented x 3 Respiratory system: Clear to auscultation. Respiratory effort normal. Cardiovascular system:RRR. No murmurs, rubs, gallops. Gastrointestinal system: Abdomen is nondistended, soft and nontender. No organomegaly or masses felt. Normal bowel sounds heard. Central nervous system: Alert and oriented. No focal neurological deficits. Extremities: No C/C/E, +pedal pulses Skin: No rashes, lesions or ulcers Psychiatry: Judgement and insight appear normal. Mood & affect appropriate.     Data Reviewed: I have personally reviewed following labs and imaging studies  CBC: Recent Labs  Lab 08/20/17 1900 08/21/17 0539  WBC 16.5* 15.6*  NEUTROABS 13.9*  --  HGB 13.4 12.1*  HCT 40.6 37.2*  MCV 97.4 97.9  PLT 282 762   Basic Metabolic Panel: Recent Labs  Lab 08/20/17 1900 08/21/17 0539  NA 136 135  K 3.7 4.2  CL 105 103  CO2 24 23  GLUCOSE 101* 97  BUN 15 13  CREATININE 0.90 0.92  CALCIUM 8.7* 8.5*   GFR: Estimated Creatinine Clearance: 62.9 mL/min (by C-G formula based on SCr of 0.92 mg/dL). Liver  Function Tests: Recent Labs  Lab 08/20/17 1900 08/21/17 0539  AST 24 20  ALT 16* 13*  ALKPHOS 93 81  BILITOT 0.6 0.9  PROT 7.6 6.6  ALBUMIN 3.2* 2.8*   No results for input(s): LIPASE, AMYLASE in the last 168 hours. No results for input(s): AMMONIA in the last 168 hours. Coagulation Profile: Recent Labs  Lab 08/20/17 1900  INR 1.91   Cardiac Enzymes: Recent Labs  Lab 08/20/17 1900  TROPONINI <0.03   BNP (last 3 results) No results for input(s): PROBNP in the last 8760 hours. HbA1C: No results for input(s): HGBA1C in the last 72 hours. CBG: No results for input(s): GLUCAP in the last 168 hours. Lipid Profile: No results for input(s): CHOL, HDL, LDLCALC, TRIG, CHOLHDL, LDLDIRECT in the last 72 hours. Thyroid Function Tests: No results for input(s): TSH, T4TOTAL, FREET4, T3FREE, THYROIDAB in the last 72 hours. Anemia Panel: No results for input(s): VITAMINB12, FOLATE, FERRITIN, TIBC, IRON, RETICCTPCT in the last 72 hours. Urine analysis:    Component Value Date/Time   COLORURINE YELLOW 08/20/2017 1910   APPEARANCEUR CLOUDY (A) 08/20/2017 1910   LABSPEC 1.015 08/20/2017 1910   PHURINE 7.0 08/20/2017 1910   GLUCOSEU NEGATIVE 08/20/2017 1910   HGBUR SMALL (A) 08/20/2017 1910   BILIRUBINUR NEGATIVE 08/20/2017 1910   KETONESUR NEGATIVE 08/20/2017 1910   PROTEINUR 30 (A) 08/20/2017 1910   NITRITE POSITIVE (A) 08/20/2017 1910   LEUKOCYTESUR LARGE (A) 08/20/2017 1910   Sepsis Labs: @LABRCNTIP (procalcitonin:4,lacticidven:4)  )No results found for this or any previous visit (from the past 240 hour(s)).       Radiology Studies: Dg Chest 1 View  Result Date: 08/20/2017 CLINICAL DATA:  Generalized weakness EXAM: CHEST 1 VIEW COMPARISON:  None. FINDINGS: Cardiomegaly and vascular pedicle widening. Congested appearance of vessels without Kerley lines or effusion. No pneumothorax or air bronchogram. Artifact from EKG leads. IMPRESSION: Cardiomegaly and vascular congestion  accentuated by low volumes. Electronically Signed   By: Monte Fantasia M.D.   On: 08/20/2017 20:04   Ct Head Wo Contrast  Result Date: 08/21/2017 CLINICAL DATA:  [place] yesterday, subarachnoid hemorrhage, followup EXAM: CT HEAD WITHOUT CONTRAST TECHNIQUE: Contiguous axial images were obtained from the base of the skull through the vertex without intravenous contrast. Sagittal and coronal MPR images reconstructed from axial data set. COMPARISON:  08/20/2017 FINDINGS: Brain: Generalized atrophy. Prominent ventricular system unchanged. No midline shift or mass effect. Small vessel chronic ischemic changes of deep cerebral white matter. Again identified small amount of high attenuation acute subarachnoid hemorrhage at the interhemispheric fissure, unchanged. No new areas of intracranial hemorrhage, mass lesion, or acute infarction. No new extra-axial collections. Vascular: Atherosclerotic calcifications of internal carotid arteries at skull base Skull: Intact though demineralized Sinuses/Orbits: Clear Other: N/A IMPRESSION: Stable appearance of a small amount of acute subarachnoid hemorrhage at the interhemispheric fissure anteriorly. Atrophy with small vessel chronic ischemic changes of deep cerebral white matter. No new intracranial abnormalities. Electronically Signed   By: Lavonia Dana M.D.   On: 08/21/2017 16:20   Ct Head  Wo Contrast  Result Date: 08/20/2017 CLINICAL DATA:  84 year old male with history of confusion today. Swelling in both legs. EXAM: CT HEAD WITHOUT CONTRAST TECHNIQUE: Contiguous axial images were obtained from the base of the skull through the vertex without intravenous contrast. COMPARISON:  No priors. FINDINGS: Brain: Small amount of high attenuation in the anterior aspect of the interhemispheric fissure best appreciated on axial images 20 and 21 and coronal images 25 through 29. Patchy and confluent areas of decreased attenuation are noted throughout the deep and periventricular white  matter of the cerebral hemispheres bilaterally, compatible with chronic microvascular ischemic disease. Moderate to severe cerebral atrophy with associated ex vacuo dilatation of the ventricular system. No evidence of acute infarction, mass or mass effect. Vascular: No hyperdense vessel or unexpected calcification. Skull: Normal. Negative for fracture or focal lesion. Sinuses/Orbits: No acute finding. Other: None. IMPRESSION: 1. Small amount of subarachnoid hemorrhage noted in the anterior interhemispheric fissure. 2. Moderate to severe cerebral atrophy with probable ex vacuo dilatation of the ventricular system. The possibility of normal pressure hydrocephalus (NPH) is not entirely excluded. 3. Extensive chronic microvascular ischemic changes throughout the cerebral white matter, as above. Critical Value/emergent results were called by telephone at the time of interpretation on 08/20/2017 at 8:23 pm to Dr. Nat Christen, who verbally acknowledged these results. Electronically Signed   By: Vinnie Langton M.D.   On: 08/20/2017 20:23        Scheduled Meds: . diltiazem  90 mg Oral TID  . latanoprost  1 drop Both Eyes QHS  . sodium chloride flush  3 mL Intravenous Q12H  . timolol  1-2 drop Left Eye BID   Continuous Infusions: . sodium chloride    . sodium chloride    . sodium chloride    . sodium chloride    . cefTRIAXone (ROCEPHIN)  IV 1 g (08/21/17 0500)     LOS: 1 day    Time spent: 25 minutes. Greater than 50% of this time was spent in direct contact with the patient coordinating care.     Lelon Frohlich, MD [facility] Pager [phone]  If 7PM-7AM, please contact night-coverage [email or web address] Password [facility] 08/21/2017, 6:56 PM

## 2017-08-21 NOTE — Progress Notes (Signed)
*  PRELIMINARY RESULTS* Echocardiogram 2D Echocardiogram has been performed.  Joel Bishop 08/21/2017, 11:22 AM

## 2017-08-21 NOTE — Progress Notes (Signed)
Patient received one unit of plasma per md order. Tolerated well.

## 2017-08-22 LAB — PREPARE FRESH FROZEN PLASMA: UNIT DIVISION: 0

## 2017-08-22 LAB — BPAM FFP
BLOOD PRODUCT EXPIRATION DATE: 201901142359
ISSUE DATE / TIME: 201901100226
UNIT TYPE AND RH: 5100

## 2017-08-22 MED ORDER — DILTIAZEM HCL 25 MG/5ML IV SOLN
10.0000 mg | Freq: Once | INTRAVENOUS | Status: AC
  Administered 2017-08-22: 10 mg via INTRAVENOUS
  Filled 2017-08-22: qty 5

## 2017-08-22 MED ORDER — DILTIAZEM HCL ER COATED BEADS 240 MG PO CP24
240.0000 mg | ORAL_CAPSULE | Freq: Every day | ORAL | Status: DC
  Administered 2017-08-22 – 2017-08-23 (×2): 240 mg via ORAL
  Filled 2017-08-22 (×3): qty 1

## 2017-08-22 NOTE — Clinical Social Work Placement (Signed)
   CLINICAL SOCIAL WORK PLACEMENT  NOTE  Date:  08/22/2017  Patient Details  Name: Joel Bishop MRN: [id number] Date of Birth: [birth year]  Clinical Social Work is seeking post-discharge placement for this patient at the [place] level of care (*CSW will initial, date and re-position this form in  chart as items are completed):  Yes   Patient/family provided with [place] Work Department's list of facilities offering this level of care within the geographic area requested by the patient (or if unable, by the patient's family).  Yes   Patient/family informed of their freedom to choose among providers that offer the needed level of care, that participate in Medicare, Medicaid or managed care program needed by the patient, have an available bed and are willing to accept the patient.  Yes   Patient/family informed of [facility]'s ownership interest in [facility] and [facility], as well as of the fact that they are under no obligation to receive care at these facilities.  PASRR submitted to EDS on 08/22/17     PASRR number received on 08/22/17     Existing PASRR number confirmed on       FL2 transmitted to all facilities in geographic area requested by pt/family on 08/22/17     FL2 transmitted to all facilities within larger geographic area on       Patient informed that his/her managed care company has contracts with or will negotiate with certain facilities, including the following:        Yes   Patient/family informed of bed offers received.  Patient chooses bed at [facility]     Physician recommends and patient chooses bed at      Patient to be transferred to [facility] on 08/23/17.  Patient to be transferred to facility by [facility] Staff     Patient family notified on 08/22/17 of transfer.  Name of family member notified:  Izora Gala, sister, was advised that patient would d/c on 08/23/16     PHYSICIAN        Additional Comment:    _______________________________________________ Ihor Gully, LCSW 08/22/2017, 1:25 PM

## 2017-08-22 NOTE — NC FL2 (Signed)
[place] LEVEL OF CARE SCREENING TOOL     IDENTIFICATION  Patient Name: Joel Bishop Birthdate: 31-Jul-1928 Sex: male Admission Date (Current Location): 08/20/2017  [facility] and [place] Number:  [employer] and Address:  [place] [address], [place]      Provider Number: [id number]  Attending Physician Name and Address:  Isaac Bliss, Olam Idler*  Relative Name and Phone Number:       Current Level of Care: Hospital Recommended Level of Care: [place] Prior Approval Number:    Date Approved/Denied:   PASRR Number: [id number])  Discharge Plan: SNF    Current Diagnoses: Patient Active Problem List   Diagnosis Date Noted  . SAH (subarachnoid hemorrhage) ([place]) 08/20/2017  . Encounter for therapeutic drug monitoring 09/27/2013  . Laboratory test 07/08/2011  . Colon carcinoma ([place])   . Hypertension   . Atrial fibrillation ([place]) 05/29/2011  . Chronic anticoagulation 05/29/2011    Orientation RESPIRATION BLADDER Height & Weight     Self, Time, Situation, Place  Normal Continent Weight: 222 lb (100.7 kg) Height:  5\' 6"  (167.6 cm)  BEHAVIORAL SYMPTOMS/MOOD NEUROLOGICAL BOWEL NUTRITION STATUS      Colostomy (Regular)  AMBULATORY STATUS COMMUNICATION OF NEEDS Skin   Limited Assist Verbally Normal                       Personal Care Assistance Level of Assistance  Bathing, Feeding, Dressing Bathing Assistance: Limited assistance Feeding assistance: Independent Dressing Assistance: Limited assistance     Functional Limitations Info  Sight, Hearing, Speech Sight Info: Adequate Hearing Info: Adequate Speech Info: Adequate    SPECIAL CARE FACTORS FREQUENCY  PT (By licensed PT)                    Contractures Contractures Info: Not present    Additional Factors Info  Code Status Code Status Info: Full Code             Current Medications (08/22/2017):  This is  the current hospital active medication list Current Facility-Administered Medications  Medication Dose Route Frequency Provider Last Rate Last Dose  . 0.9 %  sodium chloride infusion   Intravenous Once Nat Christen, MD      . 0.9 %  sodium chloride infusion  250 mL Intravenous PRN Darrick Meigs, Marge Duncans, MD      . 0.9 %  sodium chloride infusion   Intravenous Continuous Darrick Meigs, Marge Duncans, MD      . 0.9 %  sodium chloride infusion   Intravenous Once [place], Marge Duncans, MD      . acetaminophen (TYLENOL) tablet 650 mg  650 mg Oral Q6H PRN Oswald Hillock, MD       Or  . acetaminophen (TYLENOL) suppository 650 mg  650 mg Rectal Q6H PRN Oswald Hillock, MD      . cefTRIAXone (ROCEPHIN) 1 g in dextrose 5 % 50 mL IVPB  1 g Intravenous Q24H Oswald Hillock, MD   Stopped at 08/22/17 0230  . diltiazem (CARDIZEM) tablet 90 mg  90 mg Oral TID Oswald Hillock, MD   90 mg at 08/22/17 2330  . latanoprost (XALATAN) 0.005 % ophthalmic solution 1 drop  1 drop Both Eyes QHS Oswald Hillock, MD   1 drop at 08/21/17 2217  . ondansetron (ZOFRAN) tablet 4 mg  4 mg Oral Q6H PRN Oswald Hillock, MD   4 mg at 08/22/17  3016   Or  . ondansetron (ZOFRAN) injection 4 mg  4 mg Intravenous Q6H PRN Oswald Hillock, MD      . sodium chloride flush (NS) 0.9 % injection 3 mL  3 mL Intravenous Q12H Oswald Hillock, MD   3 mL at 08/21/17 2225  . sodium chloride flush (NS) 0.9 % injection 3 mL  3 mL Intravenous PRN Oswald Hillock, MD   3 mL at 08/21/17 1008  . timolol (TIMOPTIC) 0.25 % ophthalmic solution 1-2 drop  1-2 drop Left Eye BID Isaac Bliss, Rayford Halsted, MD   1 drop at 08/22/17 0109     Discharge Medications: Please see discharge summary for a list of discharge medications.  Relevant Imaging Results:  Relevant Lab Results:   Additional Information SSN [id number] Clydene Pugh, LCSW

## 2017-08-22 NOTE — Evaluation (Signed)
Physical Therapy Evaluation Patient Details Name: Joel Bishop MRN: [id number] DOB: 1928/03/03 Today's Date: 08/22/2017   History of Present Illness   Joel Bishop  is a 84 y.o. male, with history of colon carcinoma status post partial colectomy and permanent colostomy in 1998, atrial fibrillation, on chronic anticoagulation with Coumadin, hypertension came to hospital with complaints of fall.    Clinical Impression  Patient limited for functional mobility as stated below secondary to BLE weakness, fatigue and fair/poor standing balance.  Patient will benefit from continued physical therapy in hospital and recommended venue below to increase strength, balance, endurance for safe ADLs and gait.    Follow Up Recommendations SNF;Supervision/Assistance - 24 hour    Equipment Recommendations  None recommended by PT    Recommendations for Other Services       Precautions / Restrictions Precautions Precautions: Fall Restrictions Weight Bearing Restrictions: No      Mobility  Bed Mobility Overal bed mobility: Needs Assistance Bed Mobility: Supine to Sit     Supine to sit: Min guard        Transfers Overall transfer level: Needs assistance Equipment used: Rolling walker (2 wheeled) Transfers: Sit to/from [facility] Sit to Stand: Min assist Stand pivot transfers: Min assist       General transfer comment: requires verbal cues for proper hand placement during sit to stands  Ambulation/Gait Ambulation/Gait assistance: Min assist Ambulation Distance (Feet): 25 Feet Assistive device: Rolling walker (2 wheeled) Gait Pattern/deviations: Decreased step length - right;Decreased step length - left;Decreased stride length   Gait velocity interpretation: Below normal speed for age/gender General Gait Details: demonstrates slightly labored slow cadence, no loss of balance, limited secondary to c/o fatigue  Stairs            Wheelchair Mobility     Modified Rankin (Stroke Patients Only)       Balance Overall balance assessment: Needs assistance Sitting-balance support: No upper extremity supported;Feet supported Sitting balance-Leahy Scale: Good     Standing balance support: Bilateral upper extremity supported;During functional activity Standing balance-Leahy Scale: Fair                               Pertinent Vitals/Pain Pain Assessment: No/denies pain    Home Living Family/patient expects to be discharged to:: Private residence Living Arrangements: Other relatives(sister) Available Help at Discharge: Family(sister not able bodied and can provide little help per patient) Type of Home: House Home Access: Ramped entrance;Stairs to enter Entrance Stairs-Rails: Right;Left;Can reach both Entrance Stairs-Number of Steps: 3 steps from living room to kitchen, ramp entrance has no siderails Home Layout: One level Home Equipment: Cane - single point;Walker - 2 wheels;Walker - 4 wheels      Prior Function Level of Independence: Independent with assistive device(s)         Comments: ambulates with SPC household distances     Hand Dominance        Extremity/Trunk Assessment   Upper Extremity Assessment Upper Extremity Assessment: Generalized weakness    Lower Extremity Assessment Lower Extremity Assessment: Generalized weakness    Cervical / Trunk Assessment Cervical / Trunk Assessment: Normal  Communication   Communication: No difficulties  Cognition Arousal/Alertness: Awake/alert Behavior During Therapy: WFL for tasks assessed/performed Overall Cognitive Status: Within Functional Limits for tasks assessed  General Comments      Exercises     Assessment/Plan    PT Assessment Patient needs continued PT services  PT Problem List Decreased strength;Decreased activity tolerance;Decreased balance;Decreased mobility       PT  Treatment Interventions Gait training;Functional mobility training;Stair training;Therapeutic activities;Therapeutic exercise;Patient/family education    PT Goals (Current goals can be found in the Care Plan section)  Acute Rehab PT Goals Patient Stated Goal: return home Time For Goal Achievement: 09/05/17 Potential to Achieve Goals: Good    Frequency Min 3X/week   Barriers to discharge        Co-evaluation               AM-PAC PT "6 Clicks" Daily Activity  Outcome Measure Difficulty turning over in bed (including adjusting bedclothes, sheets and blankets)?: None Difficulty moving from lying on back to sitting on the side of the bed? : A Little Difficulty sitting down on and standing up from a chair with arms (e.g., wheelchair, bedside commode, etc,.)?: A Little Help needed moving to and from a bed to chair (including a wheelchair)?: A Little Help needed walking in hospital room?: A Little Help needed climbing 3-5 steps with a railing? : A Lot 6 Click Score: 18    End of Session Equipment Utilized During Treatment: Gait belt Activity Tolerance: Patient tolerated treatment well;Patient limited by fatigue Patient left: in chair;with call bell/phone within reach Nurse Communication: Mobility status PT Visit Diagnosis: Unsteadiness on feet (R26.81);Other abnormalities of gait and mobility (R26.89);Muscle weakness (generalized) (M62.81)    Time: 1884-1660 PT Time Calculation (min) (ACUTE ONLY): 32 min   Charges:   PT Evaluation $PT Eval Moderate Complexity: 1 Mod PT Treatments $Therapeutic Activity: 23-37 mins   PT G Codes:        12:10 PM, 2017/08/26 Lonell Grandchild, MPT Physical Therapist with [facility] [phone] office [phone] mobile phone

## 2017-08-22 NOTE — Clinical Social Work Placement (Signed)
   CLINICAL SOCIAL WORK PLACEMENT  NOTE  Date:  08/22/2017  Patient Details  Name: Joel Bishop MRN: [id number] Date of Birth: Jan 19, 1928  Clinical Social Work is seeking post-discharge placement for this patient at the [place] level of care (*CSW will initial, date and re-position this form in  chart as items are completed):  Yes   Patient/family provided with [place] Work Department's list of facilities offering this level of care within the geographic area requested by the patient (or if unable, by the patient's family).  Yes   Patient/family informed of their freedom to choose among providers that offer the needed level of care, that participate in Medicare, Medicaid or managed care program needed by the patient, have an available bed and are willing to accept the patient.  Yes   Patient/family informed of [facility]'s ownership interest in [facility] and [facility], as well as of the fact that they are under no obligation to receive care at these facilities.  PASRR submitted to EDS on 08/22/17     PASRR number received on 08/22/17     Existing PASRR number confirmed on       FL2 transmitted to all facilities in geographic area requested by pt/family on 08/22/17     FL2 transmitted to all facilities within larger geographic area on       Patient informed that his/her managed care company has contracts with or will negotiate with certain facilities, including the following:            Patient/family informed of bed offers received.  Patient chooses bed at       Physician recommends and patient chooses bed at      Patient to be transferred to   on  .  Patient to be transferred to facility by       Patient family notified on   of transfer.  Name of family member notified:        PHYSICIAN       Additional Comment:    _______________________________________________ Ihor Gully, LCSW 08/22/2017, 12:50 PM

## 2017-08-22 NOTE — Progress Notes (Signed)
PROGRESS NOTE    Joel Bishop  UTM:[id number] DOB: [birth year] DOA: 08/20/2017 PCP: Rosita Fire, MD     Brief Narrative:  84 year old man admitted from home on 1/9 following a fall.  He complains of weakness, was unable to get up, he denies hitting his head or passing out.  He has a history of colon cancer status post partial colectomy and permanent colostomy, A. fib on chronic anticoagulation with Coumadin.  In the ED a CT scan of the head showed a small amount of subarachnoid hemorrhage, EDP consulted with neurosurgery, Dr. Sherwood Gambler who recommended no surgical intervention and to repeat CT head in the morning.  Also found to have a UTI and is on Rocephin.   Assessment & Plan:   Active Problems:   SAH (subarachnoid hemorrhage) (HCC)   Subarachnoid hemorrhage -Repeat CT scan 24 hours later shows no change in amount of subarachnoid hemorrhage. -As per phone consultation with neurosurgery, no further intervention required. -Given his fall with weakness will request PT evaluation for discharge planning. -At this point, we will keep off Coumadin indefinitely.  UTI -Appears urine culture was never sent. -Continue Rocephin for now, transition to Cipro in a.m. for DC.  Atrial fibrillation -Continue Cardizem for rate control, off Coumadin indefinitely due to subarachnoid hemorrhage. -Rates were in the 130s at one point today, improved with IV Cardizem to the 1 teens. -We will increase Cardizem dose from 90 mg 3 times daily over to Cardizem CD 240 mg daily.   DVT prophylaxis: SCDs Code Status: Full code Family Communication: Patient only Disposition Plan: To SNF on 1/12  Pending improvement in heart rate.  Consultants:   Phone consultation with neurosurgery, Dr. Sherwood Gambler  Procedures:   Echo: Ejection fraction of 60-65%, no wall motion abnormalities, not technically sufficient to allow evaluation of LV diastolic function.  Trivial mitral regurgitation.  Moderately sclerotic  aortic valve, small circumferential pericardial effusion with no right ventricular compromise.  Antimicrobials:  Anti-infectives (From admission, onward)   Start     Dose/Rate Route Frequency Ordered Stop   08/21/17 0045  cefTRIAXone (ROCEPHIN) 1 g in dextrose 5 % 50 mL IVPB     1 g 100 mL/hr over 30 Minutes Intravenous Every 24 hours 08/21/17 0045         Subjective: Other than weakness feels well, denies chest pain or shortness of breath.  Objective: Vitals:   08/21/17 2200 08/21/17 2217 08/22/17 0520 08/22/17 1050  BP: 121/63 136/74 (!) 143/74 109/81  Pulse: (!) 102 (!) 114 (!) 119 (!) 131  Resp: 20 20 20    Temp: 99.7 F (37.6 C) 100.1 F (37.8 C) 98.9 F (37.2 C)   TempSrc: Oral Oral Oral   SpO2: 94% 93% 94%   Weight:      Height:        Intake/Output Summary (Last 24 hours) at 08/22/2017 1727 Last data filed at 08/22/2017 1200 Gross per 24 hour  Intake 600 ml  Output 1450 ml  Net -850 ml   Filed Weights   08/20/17 1849  Weight: 100.7 kg (222 lb)    Examination:  General exam: Alert, awake, oriented x 3 Respiratory system: Clear to auscultation. Respiratory effort normal. Cardiovascular system:RRR. No murmurs, rubs, gallops. Gastrointestinal system: Abdomen is nondistended, soft and nontender. No organomegaly or masses felt. Normal bowel sounds heard. Central nervous system: Alert and oriented. No focal neurological deficits. Extremities: No C/C/E, +pedal pulses Skin: No rashes, lesions or ulcers Psychiatry: Judgement and insight appear normal. Mood & affect appropriate.  Data Reviewed: I have personally reviewed following labs and imaging studies  CBC: Recent Labs  Lab 08/20/17 1900 08/21/17 0539  WBC 16.5* 15.6*  NEUTROABS 13.9*  --   HGB 13.4 12.1*  HCT 40.6 37.2*  MCV 97.4 97.9  PLT 282 097   Basic Metabolic Panel: Recent Labs  Lab 08/20/17 1900 08/21/17 0539  NA 136 135  K 3.7 4.2  CL 105 103  CO2 24 23  GLUCOSE 101* 97  BUN 15  13  CREATININE 0.90 0.92  CALCIUM 8.7* 8.5*   GFR: Estimated Creatinine Clearance: 62.9 mL/min (by C-G formula based on SCr of 0.92 mg/dL). Liver Function Tests: Recent Labs  Lab 08/20/17 1900 08/21/17 0539  AST 24 20  ALT 16* 13*  ALKPHOS 93 81  BILITOT 0.6 0.9  PROT 7.6 6.6  ALBUMIN 3.2* 2.8*   No results for input(s): LIPASE, AMYLASE in the last 168 hours. No results for input(s): AMMONIA in the last 168 hours. Coagulation Profile: Recent Labs  Lab 08/20/17 1900  INR 1.91   Cardiac Enzymes: Recent Labs  Lab 08/20/17 1900  TROPONINI <0.03   BNP (last 3 results) No results for input(s): PROBNP in the last 8760 hours. HbA1C: No results for input(s): HGBA1C in the last 72 hours. CBG: No results for input(s): GLUCAP in the last 168 hours. Lipid Profile: No results for input(s): CHOL, HDL, LDLCALC, TRIG, CHOLHDL, LDLDIRECT in the last 72 hours. Thyroid Function Tests: No results for input(s): TSH, T4TOTAL, FREET4, T3FREE, THYROIDAB in the last 72 hours. Anemia Panel: No results for input(s): VITAMINB12, FOLATE, FERRITIN, TIBC, IRON, RETICCTPCT in the last 72 hours. Urine analysis:    Component Value Date/Time   COLORURINE YELLOW 08/20/2017 1910   APPEARANCEUR CLOUDY (A) 08/20/2017 1910   LABSPEC 1.015 08/20/2017 1910   PHURINE 7.0 08/20/2017 1910   GLUCOSEU NEGATIVE 08/20/2017 1910   HGBUR SMALL (A) 08/20/2017 1910   BILIRUBINUR NEGATIVE 08/20/2017 1910   KETONESUR NEGATIVE 08/20/2017 1910   PROTEINUR 30 (A) 08/20/2017 1910   NITRITE POSITIVE (A) 08/20/2017 1910   LEUKOCYTESUR LARGE (A) 08/20/2017 1910   Sepsis Labs: @LABRCNTIP (procalcitonin:4,lacticidven:4)  )No results found for this or any previous visit (from the past 240 hour(s)).       Radiology Studies: Dg Chest 1 View  Result Date: 08/20/2017 CLINICAL DATA:  Generalized weakness EXAM: CHEST 1 VIEW COMPARISON:  None. FINDINGS: Cardiomegaly and vascular pedicle widening. Congested appearance of  vessels without Kerley lines or effusion. No pneumothorax or air bronchogram. Artifact from EKG leads. IMPRESSION: Cardiomegaly and vascular congestion accentuated by low volumes. Electronically Signed   By: Monte Fantasia M.D.   On: 08/20/2017 20:04   Ct Head Wo Contrast  Result Date: 08/21/2017 CLINICAL DATA:  [place] yesterday, subarachnoid hemorrhage, followup EXAM: CT HEAD WITHOUT CONTRAST TECHNIQUE: Contiguous axial images were obtained from the base of the skull through the vertex without intravenous contrast. Sagittal and coronal MPR images reconstructed from axial data set. COMPARISON:  08/20/2017 FINDINGS: Brain: Generalized atrophy. Prominent ventricular system unchanged. No midline shift or mass effect. Small vessel chronic ischemic changes of deep cerebral white matter. Again identified small amount of high attenuation acute subarachnoid hemorrhage at the interhemispheric fissure, unchanged. No new areas of intracranial hemorrhage, mass lesion, or acute infarction. No new extra-axial collections. Vascular: Atherosclerotic calcifications of internal carotid arteries at skull base Skull: Intact though demineralized Sinuses/Orbits: Clear Other: N/A IMPRESSION: Stable appearance of a small amount of acute subarachnoid hemorrhage at the interhemispheric fissure anteriorly. Atrophy with  small vessel chronic ischemic changes of deep cerebral white matter. No new intracranial abnormalities. Electronically Signed   By: Lavonia Dana M.D.   On: 08/21/2017 16:20   Ct Head Wo Contrast  Result Date: 08/20/2017 CLINICAL DATA:  84 year old male with history of confusion today. Swelling in both legs. EXAM: CT HEAD WITHOUT CONTRAST TECHNIQUE: Contiguous axial images were obtained from the base of the skull through the vertex without intravenous contrast. COMPARISON:  No priors. FINDINGS: Brain: Small amount of high attenuation in the anterior aspect of the interhemispheric fissure best appreciated on axial images 20  and 21 and coronal images 25 through 29. Patchy and confluent areas of decreased attenuation are noted throughout the deep and periventricular white matter of the cerebral hemispheres bilaterally, compatible with chronic microvascular ischemic disease. Moderate to severe cerebral atrophy with associated ex vacuo dilatation of the ventricular system. No evidence of acute infarction, mass or mass effect. Vascular: No hyperdense vessel or unexpected calcification. Skull: Normal. Negative for fracture or focal lesion. Sinuses/Orbits: No acute finding. Other: None. IMPRESSION: 1. Small amount of subarachnoid hemorrhage noted in the anterior interhemispheric fissure. 2. Moderate to severe cerebral atrophy with probable ex vacuo dilatation of the ventricular system. The possibility of normal pressure hydrocephalus (NPH) is not entirely excluded. 3. Extensive chronic microvascular ischemic changes throughout the cerebral white matter, as above. Critical Value/emergent results were called by telephone at the time of interpretation on 08/20/2017 at 8:23 pm to Dr. Nat Christen, who verbally acknowledged these results. Electronically Signed   By: Vinnie Langton M.D.   On: 08/20/2017 20:23        Scheduled Meds: . diltiazem  90 mg Oral TID  . latanoprost  1 drop Both Eyes QHS  . sodium chloride flush  3 mL Intravenous Q12H  . timolol  1-2 drop Left Eye BID   Continuous Infusions: . sodium chloride    . sodium chloride    . sodium chloride    . sodium chloride    . cefTRIAXone (ROCEPHIN)  IV Stopped (08/22/17 0230)     LOS: 2 days    Time spent: 25 minutes. Greater than 50% of this time was spent in direct contact with the patient coordinating care.     Lelon Frohlich, MD [facility] Pager [phone]  If 7PM-7AM, please contact night-coverage [email or web address] Password [facility] 08/22/2017, 5:27 PM

## 2017-08-22 NOTE — Clinical Social Work Note (Signed)
Clinical Social Work Assessment  Patient Details  Name: Joel Bishop MRN: [id number] Date of Birth: 09-25-1927  Date of referral:  08/22/17               Reason for consult:  Facility Placement                Permission sought to share information with:    Permission granted to share information::     Name::        Agency::     Relationship::     Contact Information:     Housing/Transportation Living arrangements for the past 2 months:  Single Family Home Source of Information:  Patient Patient Interpreter Needed:  None Criminal Activity/Legal Involvement Pertinent to Current Situation/Hospitalization:  No - Comment as needed Significant Relationships:  Siblings Lives with:  Siblings Do you feel safe going back to the place where you live?  Yes Need for family participation in patient care:  Yes (Comment)  Care giving concerns:  None identified at baseline   Social Worker assessment / plan: Patient lives in the home with his sister. At baseline he ambulates with a cane and is mainly independent in ADLs. Patient states that if he needs any assistance his sister will assist him. Patient is agreeable to SNF.     Employment status:  Retired Forensic scientist:  Medicare PT Recommendations:    Information / Referral to community resources:  [place]  Patient/Family's Response to care:  Patient is agreeable to SNF. He states he will do what he has to do to get back to his baseline.   Patient/Family's Understanding of and Emotional Response to Diagnosis, Current Treatment, and Prognosis:  Patient understands his diagnosis, treatment and prognosis.   Emotional Assessment Appearance:  Appears stated age Attitude/Demeanor/Rapport:    Affect (typically observed):  Accepting, Calm Orientation:  Oriented to Self, Oriented to Place, Oriented to  Time, Oriented to Situation Alcohol / Substance use:  Not Applicable Psych involvement (Current and /or in the  community):  No (Comment)  Discharge Needs  Concerns to be addressed:  Discharge Planning Concerns Readmission within the last 30 days:  No Current discharge risk:  None Barriers to Discharge:  No Barriers Identified   Ihor Gully, LCSW 08/22/2017, 1:06 PM

## 2017-08-22 NOTE — Care Management Important Message (Signed)
Important Message  Patient Details  Name: Joel Bishop MRN: [id number] Date of Birth: 1927/11/20   Medicare Important Message Given:  Yes    Robertlee Rogacki, Chauncey Reading, RN 08/22/2017, 8:22 AM

## 2017-08-22 NOTE — Plan of Care (Signed)
  Acute Rehab PT Goals(only PT should resolve) Pt Will Go Supine/Side To Sit 08/22/2017 1211 - Progressing by Lonell Grandchild, PT Flowsheets Taken 08/22/2017 1211  Pt will go Supine/Side to Sit with supervision Patient Will Transfer Sit To/From Stand 08/22/2017 1211 - Progressing by Lonell Grandchild, PT Flowsheets Taken 08/22/2017 1211  Patient will transfer sit to/from stand with supervision Pt Will Transfer Bed To Chair/Chair To Bed 08/22/2017 1211 - Progressing by Lonell Grandchild, PT Flowsheets Taken 08/22/2017 1211  Pt will Transfer Bed to Chair/Chair to Bed with supervision Pt Will Ambulate 08/22/2017 1211 - Progressing by Lonell Grandchild, PT Flowsheets Taken 08/22/2017 1211  Pt will Ambulate with supervision;50 feet;with rolling walker  12:12 PM, 08/22/17 Lonell Grandchild, MPT Physical Therapist with [facility] 336 [phone] office [phone] mobile phone

## 2017-08-23 DIAGNOSIS — Z5189 Encounter for other specified aftercare: Secondary | ICD-10-CM | POA: Diagnosis not present

## 2017-08-23 DIAGNOSIS — J9601 Acute respiratory failure with hypoxia: Secondary | ICD-10-CM | POA: Diagnosis not present

## 2017-08-23 DIAGNOSIS — E869 Volume depletion, unspecified: Secondary | ICD-10-CM | POA: Diagnosis present

## 2017-08-23 DIAGNOSIS — C189 Malignant neoplasm of colon, unspecified: Secondary | ICD-10-CM | POA: Diagnosis not present

## 2017-08-23 DIAGNOSIS — I4891 Unspecified atrial fibrillation: Secondary | ICD-10-CM | POA: Diagnosis not present

## 2017-08-23 DIAGNOSIS — H409 Unspecified glaucoma: Secondary | ICD-10-CM | POA: Diagnosis present

## 2017-08-23 DIAGNOSIS — Z79899 Other long term (current) drug therapy: Secondary | ICD-10-CM | POA: Diagnosis not present

## 2017-08-23 DIAGNOSIS — Z789 Other specified health status: Secondary | ICD-10-CM | POA: Diagnosis not present

## 2017-08-23 DIAGNOSIS — Z66 Do not resuscitate: Secondary | ICD-10-CM | POA: Diagnosis present

## 2017-08-23 DIAGNOSIS — K56609 Unspecified intestinal obstruction, unspecified as to partial versus complete obstruction: Secondary | ICD-10-CM | POA: Diagnosis not present

## 2017-08-23 DIAGNOSIS — Z978 Presence of other specified devices: Secondary | ICD-10-CM | POA: Diagnosis not present

## 2017-08-23 DIAGNOSIS — E870 Hyperosmolality and hypernatremia: Secondary | ICD-10-CM | POA: Diagnosis present

## 2017-08-23 DIAGNOSIS — N170 Acute kidney failure with tubular necrosis: Secondary | ICD-10-CM | POA: Diagnosis present

## 2017-08-23 DIAGNOSIS — D72829 Elevated white blood cell count, unspecified: Secondary | ICD-10-CM | POA: Diagnosis not present

## 2017-08-23 DIAGNOSIS — Z433 Encounter for attention to colostomy: Secondary | ICD-10-CM | POA: Diagnosis not present

## 2017-08-23 DIAGNOSIS — Z85038 Personal history of other malignant neoplasm of large intestine: Secondary | ICD-10-CM | POA: Diagnosis not present

## 2017-08-23 DIAGNOSIS — N3000 Acute cystitis without hematuria: Secondary | ICD-10-CM | POA: Diagnosis not present

## 2017-08-23 DIAGNOSIS — Z515 Encounter for palliative care: Secondary | ICD-10-CM | POA: Diagnosis not present

## 2017-08-23 DIAGNOSIS — A419 Sepsis, unspecified organism: Secondary | ICD-10-CM | POA: Diagnosis not present

## 2017-08-23 DIAGNOSIS — K56699 Other intestinal obstruction unspecified as to partial versus complete obstruction: Secondary | ICD-10-CM | POA: Diagnosis not present

## 2017-08-23 DIAGNOSIS — Z9842 Cataract extraction status, left eye: Secondary | ICD-10-CM | POA: Diagnosis not present

## 2017-08-23 DIAGNOSIS — N1339 Other hydronephrosis: Secondary | ICD-10-CM | POA: Diagnosis not present

## 2017-08-23 DIAGNOSIS — K5651 Intestinal adhesions [bands], with partial obstruction: Secondary | ICD-10-CM | POA: Diagnosis present

## 2017-08-23 DIAGNOSIS — K219 Gastro-esophageal reflux disease without esophagitis: Secondary | ICD-10-CM | POA: Diagnosis present

## 2017-08-23 DIAGNOSIS — I609 Nontraumatic subarachnoid hemorrhage, unspecified: Secondary | ICD-10-CM | POA: Diagnosis not present

## 2017-08-23 DIAGNOSIS — J9600 Acute respiratory failure, unspecified whether with hypoxia or hypercapnia: Secondary | ICD-10-CM | POA: Diagnosis not present

## 2017-08-23 DIAGNOSIS — R1114 Bilious vomiting: Secondary | ICD-10-CM | POA: Diagnosis not present

## 2017-08-23 DIAGNOSIS — R112 Nausea with vomiting, unspecified: Secondary | ICD-10-CM | POA: Diagnosis not present

## 2017-08-23 DIAGNOSIS — Z933 Colostomy status: Secondary | ICD-10-CM | POA: Diagnosis not present

## 2017-08-23 DIAGNOSIS — R140 Abdominal distension (gaseous): Secondary | ICD-10-CM | POA: Diagnosis not present

## 2017-08-23 DIAGNOSIS — Z7189 Other specified counseling: Secondary | ICD-10-CM | POA: Diagnosis not present

## 2017-08-23 DIAGNOSIS — R262 Difficulty in walking, not elsewhere classified: Secondary | ICD-10-CM | POA: Diagnosis not present

## 2017-08-23 DIAGNOSIS — J690 Pneumonitis due to inhalation of food and vomit: Secondary | ICD-10-CM | POA: Diagnosis not present

## 2017-08-23 DIAGNOSIS — Z9181 History of falling: Secondary | ICD-10-CM | POA: Diagnosis not present

## 2017-08-23 DIAGNOSIS — I482 Chronic atrial fibrillation: Secondary | ICD-10-CM | POA: Diagnosis not present

## 2017-08-23 DIAGNOSIS — N179 Acute kidney failure, unspecified: Secondary | ICD-10-CM | POA: Diagnosis not present

## 2017-08-23 DIAGNOSIS — I480 Paroxysmal atrial fibrillation: Secondary | ICD-10-CM | POA: Diagnosis not present

## 2017-08-23 DIAGNOSIS — E876 Hypokalemia: Secondary | ICD-10-CM | POA: Diagnosis not present

## 2017-08-23 DIAGNOSIS — R1084 Generalized abdominal pain: Secondary | ICD-10-CM | POA: Diagnosis not present

## 2017-08-23 DIAGNOSIS — N1330 Unspecified hydronephrosis: Secondary | ICD-10-CM | POA: Diagnosis not present

## 2017-08-23 DIAGNOSIS — E875 Hyperkalemia: Secondary | ICD-10-CM | POA: Diagnosis not present

## 2017-08-23 DIAGNOSIS — K432 Incisional hernia without obstruction or gangrene: Secondary | ICD-10-CM | POA: Diagnosis present

## 2017-08-23 DIAGNOSIS — Z9841 Cataract extraction status, right eye: Secondary | ICD-10-CM | POA: Diagnosis not present

## 2017-08-23 DIAGNOSIS — R279 Unspecified lack of coordination: Secondary | ICD-10-CM | POA: Diagnosis not present

## 2017-08-23 DIAGNOSIS — D72828 Other elevated white blood cell count: Secondary | ICD-10-CM | POA: Diagnosis not present

## 2017-08-23 DIAGNOSIS — I608 Other nontraumatic subarachnoid hemorrhage: Secondary | ICD-10-CM | POA: Diagnosis not present

## 2017-08-23 DIAGNOSIS — R6521 Severe sepsis with septic shock: Secondary | ICD-10-CM | POA: Diagnosis not present

## 2017-08-23 DIAGNOSIS — W19XXXD Unspecified fall, subsequent encounter: Secondary | ICD-10-CM | POA: Diagnosis present

## 2017-08-23 DIAGNOSIS — E872 Acidosis: Secondary | ICD-10-CM | POA: Diagnosis present

## 2017-08-23 DIAGNOSIS — K8020 Calculus of gallbladder without cholecystitis without obstruction: Secondary | ICD-10-CM | POA: Diagnosis present

## 2017-08-23 DIAGNOSIS — I10 Essential (primary) hypertension: Secondary | ICD-10-CM | POA: Diagnosis not present

## 2017-08-23 DIAGNOSIS — M6281 Muscle weakness (generalized): Secondary | ICD-10-CM | POA: Diagnosis not present

## 2017-08-23 DIAGNOSIS — N136 Pyonephrosis: Secondary | ICD-10-CM | POA: Diagnosis present

## 2017-08-23 DIAGNOSIS — Z7901 Long term (current) use of anticoagulants: Secondary | ICD-10-CM | POA: Diagnosis not present

## 2017-08-23 MED ORDER — CIPROFLOXACIN HCL 500 MG PO TABS: 500.0000 mg | ORAL_TABLET | Freq: Two times a day (BID) | ORAL | 0 refills | Status: AC

## 2017-08-23 MED ORDER — DILTIAZEM HCL ER COATED BEADS 240 MG PO CP24: 240.0000 mg | ORAL_CAPSULE | Freq: Every day | ORAL | Status: DC

## 2017-08-23 NOTE — Discharge Summary (Signed)
Physician Discharge Summary  [name] NOI:[id number] DOB: 1927-11-21 DOA: 08/20/2017  PCP: Rosita Fire, MD  Admit date: 08/20/2017 Discharge date: 08/23/2017  Time spent: 45 minutes  Recommendations for Outpatient Follow-up:  -Will be discharged home today. -Warfarin has been held indefinitely due to head bleed.   Discharge Diagnoses:  Active Problems:   SAH (subarachnoid hemorrhage) (HCC) UTI A Fib with RVR  Discharge Condition: Stable and improved  Filed Weights   08/20/17 1849  Weight: 100.7 kg (222 lb)    History of present illness:  As per Dr. Darrick Meigs on 1/9: Joel Bishop  is a 84 y.o. male, with history of colon carcinoma status post partial colectomy and permanent colostomy in 1998, atrial fibrillation, on chronic anticoagulation with Coumadin, hypertension came to hospital with complaints of fall. As per patient sister patient became extremely weak and fell, he was unable to get up. Patient denies passing out. Denies hitting his head. Denies chest pain or shortness of breath. No nausea vomiting or diarrhea.  In the ED, CT of the head showed small amount of subarachnoid hemorrhage noted in the anterior  hemispheric fissure. Neurosurgery, Dr Rita Ohara was consulted by the ED physician, and he recommended no surgical intervention at this time. Also recommended to hold Coumadin, patient given vitamin K and FFP in the ED.  Also found to have abnormal urine, started on IV ceftriaxone. Urine culture obtained in the ED    Hospital Course:   Subarachnoid hemorrhage -Repeat CT scan 24 hours post admission shows no change in amount of subarachnoid hemorrhage. -As per phone consultation with neurosurgery, no further intervention required. -Given his fall with weakness will request PT evaluation for discharge planning. -At this point, we will keep off Coumadin indefinitely.  UTI -Appears urine culture was never sent. -Transition to cipro for 3 more days on  DC. -Patient remains nontoxic.  Atrial fibrillation with RVR -Continue Cardizem for rate control, off Coumadin indefinitely due to subarachnoid hemorrhage. -Cardizem dose was increased from 90 mg 3 times daily over to Cardizem CD 240 mg daily due to rapid rates. -Today rates are in the low 100s, no plans for further medication adjustments at this point.     Procedures:  None   Consultations:  None  Discharge Instructions  Discharge Instructions    Diet - low sodium heart healthy   Complete by:  As directed    Increase activity slowly   Complete by:  As directed      Allergies as of 08/23/2017   No Known Allergies     Medication List    STOP taking these medications   diltiazem 90 MG tablet Commonly known as:  CARDIZEM   warfarin 3 MG tablet Commonly known as:  COUMADIN     TAKE these medications   ciprofloxacin 500 MG tablet Commonly known as:  CIPRO Take 1 tablet (500 mg total) by mouth 2 (two) times daily for 3 days.   diltiazem 240 MG 24 hr capsule Commonly known as:  CARDIZEM CD Take 1 capsule (240 mg total) by mouth daily. Start taking on:  08/24/2017   latanoprost 0.005 % ophthalmic solution Commonly known as:  XALATAN Place 1 drop into both eyes at bedtime.   timolol 0.25 % ophthalmic solution Commonly known as:  BETIMOL Place 1-2 drops into the left eye 2 (two) times daily.      No Known Allergies Contact information for after-discharge care    [facility] Follow up.  Service:  Skilled Nursing Contact information: [address] [place] [phone]               The results of significant diagnostics from this hospitalization (including imaging, microbiology, ancillary and laboratory) are listed below for reference.    Significant Diagnostic Studies: Dg Chest 1 View  Result Date: 08/20/2017 CLINICAL DATA:  Generalized weakness EXAM: CHEST 1 VIEW COMPARISON:  None.  FINDINGS: Cardiomegaly and vascular pedicle widening. Congested appearance of vessels without Kerley lines or effusion. No pneumothorax or air bronchogram. Artifact from EKG leads. IMPRESSION: Cardiomegaly and vascular congestion accentuated by low volumes. Electronically Signed   By: Monte Fantasia M.D.   On: 08/20/2017 20:04   Ct Head Wo Contrast  Result Date: 08/21/2017 CLINICAL DATA:  [place] yesterday, subarachnoid hemorrhage, followup EXAM: CT HEAD WITHOUT CONTRAST TECHNIQUE: Contiguous axial images were obtained from the base of the skull through the vertex without intravenous contrast. Sagittal and coronal MPR images reconstructed from axial data set. COMPARISON:  08/20/2017 FINDINGS: Brain: Generalized atrophy. Prominent ventricular system unchanged. No midline shift or mass effect. Small vessel chronic ischemic changes of deep cerebral white matter. Again identified small amount of high attenuation acute subarachnoid hemorrhage at the interhemispheric fissure, unchanged. No new areas of intracranial hemorrhage, mass lesion, or acute infarction. No new extra-axial collections. Vascular: Atherosclerotic calcifications of internal carotid arteries at skull base Skull: Intact though demineralized Sinuses/Orbits: Clear Other: N/A IMPRESSION: Stable appearance of a small amount of acute subarachnoid hemorrhage at the interhemispheric fissure anteriorly. Atrophy with small vessel chronic ischemic changes of deep cerebral white matter. No new intracranial abnormalities. Electronically Signed   By: Lavonia Dana M.D.   On: 08/21/2017 16:20   Ct Head Wo Contrast  Result Date: 08/20/2017 CLINICAL DATA:  84 year old male with history of confusion today. Swelling in both legs. EXAM: CT HEAD WITHOUT CONTRAST TECHNIQUE: Contiguous axial images were obtained from the base of the skull through the vertex without intravenous contrast. COMPARISON:  No priors. FINDINGS: Brain: Small amount of high attenuation in the  anterior aspect of the interhemispheric fissure best appreciated on axial images 20 and 21 and coronal images 25 through 29. Patchy and confluent areas of decreased attenuation are noted throughout the deep and periventricular white matter of the cerebral hemispheres bilaterally, compatible with chronic microvascular ischemic disease. Moderate to severe cerebral atrophy with associated ex vacuo dilatation of the ventricular system. No evidence of acute infarction, mass or mass effect. Vascular: No hyperdense vessel or unexpected calcification. Skull: Normal. Negative for fracture or focal lesion. Sinuses/Orbits: No acute finding. Other: None. IMPRESSION: 1. Small amount of subarachnoid hemorrhage noted in the anterior interhemispheric fissure. 2. Moderate to severe cerebral atrophy with probable ex vacuo dilatation of the ventricular system. The possibility of normal pressure hydrocephalus (NPH) is not entirely excluded. 3. Extensive chronic microvascular ischemic changes throughout the cerebral white matter, as above. Critical Value/emergent results were called by telephone at the time of interpretation on 08/20/2017 at 8:23 pm to Dr. Nat Christen, who verbally acknowledged these results. Electronically Signed   By: Vinnie Langton M.D.   On: 08/20/2017 20:23    Microbiology: No results found for this or any previous visit (from the past 240 hour(s)).   Labs: Basic Metabolic Panel: Recent Labs  Lab 08/20/17 1900 08/21/17 0539  NA 136 135  K 3.7 4.2  CL 105 103  CO2 24 23  GLUCOSE 101* 97  BUN 15 13  CREATININE 0.90 0.92  CALCIUM  8.7* 8.5*   Liver Function Tests: Recent Labs  Lab 08/20/17 1900 08/21/17 0539  AST 24 20  ALT 16* 13*  ALKPHOS 93 81  BILITOT 0.6 0.9  PROT 7.6 6.6  ALBUMIN 3.2* 2.8*   No results for input(s): LIPASE, AMYLASE in the last 168 hours. No results for input(s): AMMONIA in the last 168 hours. CBC: Recent Labs  Lab 08/20/17 1900 08/21/17 0539  WBC 16.5* 15.6*   NEUTROABS 13.9*  --   HGB 13.4 12.1*  HCT 40.6 37.2*  MCV 97.4 97.9  PLT 282 249   Cardiac Enzymes: Recent Labs  Lab 08/20/17 1900  TROPONINI <0.03   BNP: BNP (last 3 results) No results for input(s): BNP in the last 8760 hours.  ProBNP (last 3 results) No results for input(s): PROBNP in the last 8760 hours.  CBG: No results for input(s): GLUCAP in the last 168 hours.     Signed:  Lelon Frohlich  [facility] Pager: [phone] 08/23/2017, 11:33 AM

## 2017-08-23 NOTE — Progress Notes (Signed)
Report called in to [place], LPN at the [facility]. IV removed, patient tolerated procedure well. Staff is preparing to transport patient now.

## 2017-08-23 NOTE — Care Management Note (Signed)
Case Management Note  Patient Details  Name: Joel Bishop MRN: [id number] Date of Birth: 01/21/1928  Subjective/Objective:                 DC to SNF as facilitated by CSW.    Action/Plan:   Expected Discharge Date:  08/23/17               Expected Discharge Plan:  Skilled Nursing Facility  In-House Referral:  Clinical Social Work  Discharge planning Services  CM Consult  Post Acute Care Choice:    Choice offered to:     DME Arranged:    DME Agency:     HH Arranged:    [place] Agency:     Status of Service:  Completed, signed off  If discussed at [employer], dates discussed:    Additional Comments:  Carles Collet, RN 08/23/2017, 11:55 AM

## 2017-08-24 ENCOUNTER — Inpatient Hospital Stay
Admission: RE | Admit: 2017-08-24 | Discharge: 2017-08-28 | Disposition: A | Discharge status: Nursing Home (Includes ICF) | Payer: Medicare Other | Source: Ambulatory Visit | Attending: Internal Medicine | Admitting: Internal Medicine

## 2017-08-24 ENCOUNTER — Emergency Department (HOSPITAL_COMMUNITY): Payer: Medicare Other

## 2017-08-24 ENCOUNTER — Encounter (HOSPITAL_COMMUNITY): Payer: Self-pay

## 2017-08-24 ENCOUNTER — Emergency Department (HOSPITAL_COMMUNITY)
Admission: EM | Admit: 2017-08-24 | Discharge: 2017-08-24 | Disposition: A | Discharge status: Rehabilitation Facility | Payer: Medicare Other | Attending: Emergency Medicine | Admitting: Emergency Medicine

## 2017-08-24 DIAGNOSIS — Z85038 Personal history of other malignant neoplasm of large intestine: Secondary | ICD-10-CM | POA: Insufficient documentation

## 2017-08-24 DIAGNOSIS — I10 Essential (primary) hypertension: Secondary | ICD-10-CM | POA: Diagnosis not present

## 2017-08-24 DIAGNOSIS — Z79899 Other long term (current) drug therapy: Secondary | ICD-10-CM | POA: Insufficient documentation

## 2017-08-24 DIAGNOSIS — Z933 Colostomy status: Secondary | ICD-10-CM | POA: Insufficient documentation

## 2017-08-24 DIAGNOSIS — R140 Abdominal distension (gaseous): Secondary | ICD-10-CM | POA: Insufficient documentation

## 2017-08-24 DIAGNOSIS — R1084 Generalized abdominal pain: Secondary | ICD-10-CM | POA: Insufficient documentation

## 2017-08-24 DIAGNOSIS — R1114 Bilious vomiting: Secondary | ICD-10-CM | POA: Insufficient documentation

## 2017-08-24 DIAGNOSIS — Z7901 Long term (current) use of anticoagulants: Secondary | ICD-10-CM | POA: Diagnosis not present

## 2017-08-24 DIAGNOSIS — R112 Nausea with vomiting, unspecified: Secondary | ICD-10-CM | POA: Diagnosis not present

## 2017-08-24 HISTORY — DX: History of falling: Z91.81

## 2017-08-24 HISTORY — DX: Urinary tract infection, site not specified: N39.0

## 2017-08-24 HISTORY — DX: Colostomy status: Z93.3

## 2017-08-24 HISTORY — DX: Nontraumatic subarachnoid hemorrhage, unspecified: I60.9

## 2017-08-24 HISTORY — DX: Unspecified atrial fibrillation: I48.91

## 2017-08-24 HISTORY — DX: Unspecified glaucoma: H40.9

## 2017-08-24 LAB — I-STAT CHEM 8, ED
BUN: 15 mg/dL (ref 6–20)
CALCIUM ION: 1.14 mmol/L — AB (ref 1.15–1.40)
Chloride: 101 mmol/L (ref 101–111)
Creatinine, Ser: 0.8 mg/dL (ref 0.61–1.24)
GLUCOSE: 102 mg/dL — AB (ref 65–99)
HCT: 45 % (ref 39.0–52.0)
Hemoglobin: 15.3 g/dL (ref 13.0–17.0)
Potassium: 4.3 mmol/L (ref 3.5–5.1)
Sodium: 139 mmol/L (ref 135–145)
TCO2: 28 mmol/L (ref 22–32)

## 2017-08-24 NOTE — ED Provider Notes (Addendum)
4:51 PM  [facility] EMERGENCY DEPARTMENT Provider Note   CSN: [id number] Arrival date & time: 08/24/17  1310     History   Chief Complaint Chief Complaint  Patient presents with  . Emesis    HPI Joel Bishop is a 84 y.o. male.  HPI  Patient presents with concern of nausea, vomiting, abdominal fullness. Symptoms have actually improved after he developed them earlier today. Patient has a history of prior colon cancer, has had a colostomy for more than 1 decade. More notably, the patient had fall with subarachnoid hemorrhage several days ago, and was transferred to a nursing facility. Transfer was yesterday, when he notes that after transfer he is doing generally well until this morning. Gradually, today, the patient began to feel more nausea than usual, with abdominal fullness. Patient had one episode of vomiting, and though he was feeling substantially better, he was sent here for evaluation. He states that he feels otherwise in his usual state of health, with no new weakness, no new confusion, no fever.   Past Medical History:  Diagnosis Date  . At risk for falls   . Atrial fibrillation ([place])   . Chronic anticoagulation 2002  . Colon carcinoma ([place]) 1998   s/p partial colectomy and permanent colostomyin 1998  . Colostomy in place [facility])   . Glaucoma   . Glaucoma   . Hypertension   . Nontraumatic subarachnoid hemorrhage ([place])   . Overweight(278.02)   . Paroxysmal atrial fibrillation ([place]) 2002   Single episode by history during a noncardiac illness  . UTI (urinary tract infection)     Patient Active Problem List   Diagnosis Date Noted  . SAH (subarachnoid hemorrhage) ([place]) 08/20/2017  . Encounter for therapeutic drug monitoring 09/27/2013  . Laboratory test 07/08/2011  . Colon carcinoma ([place])   . Hypertension   . Atrial fibrillation ([place]) 05/29/2011  . Chronic anticoagulation 05/29/2011    Past Surgical History:  Procedure Laterality Date  .  ABDOMINOPERINEAL PROCTOCOLECTOMY  1998   colon carcinoma; permanent colostomy  . CATARACT EXTRACTION     Bilateral  . COLONOSCOPY  11/2007   Dr. Juanda Crumble Mueller/[facility], via ostomy. unremarkable.       Home Medications    Prior to Admission medications   Medication Sig Start Date End Date Taking? Authorizing Provider  ciprofloxacin (CIPRO) 500 MG tablet Take 1 tablet (500 mg total) by mouth 2 (two) times daily for 3 days. 08/23/17 08/26/17 Yes Erline Hau, MD  diltiazem (CARDIZEM CD) 240 MG 24 hr capsule Take 1 capsule (240 mg total) by mouth daily. 08/24/17  Yes Isaac Bliss, Rayford Halsted, MD  latanoprost (XALATAN) 0.005 % ophthalmic solution Place 1 drop into both eyes at bedtime.    Yes [provider]  timolol (BETIMOL) 0.25 % ophthalmic solution Place 1-2 drops into the left eye 2 (two) times daily.    Yes [provider]  diltiazem (CARDIZEM) 90 MG tablet  07/28/17   [provider]  warfarin (COUMADIN) 3 MG tablet  07/26/17   [provider]    Family History Family History  Problem Relation Age of Onset  . Cancer Mother        gallbladder  . Uterine cancer Maternal Aunt   . Pancreatic cancer Brother   . Liver cancer Sister   . Other Brother        amylodosis  . Kidney cancer Sister     Social History Social History   Tobacco  Use  . Smoking status: Never Smoker  . Smokeless tobacco: Never Used  Substance Use Topics  . Alcohol use: No    Alcohol/week: 0.0 oz  . Drug use: No     Allergies   Patient has no known allergies.   Review of Systems Review of Systems  Constitutional:       Per HPI, otherwise negative  HENT:       Per HPI, otherwise negative  Respiratory:       Per HPI, otherwise negative  Cardiovascular:       Per HPI, otherwise negative  Gastrointestinal: Positive for abdominal pain, nausea and vomiting.  Endocrine:       Negative aside from HPI  Genitourinary:        Neg aside from HPI   Musculoskeletal:       Per HPI, otherwise negative  Skin: Negative.   Neurological: Negative for syncope.     Physical Exam Updated Vital Signs BP 124/80 (BP Location: Left Arm)   Pulse (!) 125   Temp 97.9 F (36.6 C) (Oral)   Wt 100.7 kg (222 lb)   SpO2 93%   BMI 35.83 kg/m   Physical Exam  Constitutional: He is oriented to person, place, and time. He appears well-developed. No distress.  HENT:  Head: Normocephalic and atraumatic.  Eyes: Conjunctivae and EOM are normal.  Cardiovascular: Normal rate and regular rhythm.  Pulmonary/Chest: Effort normal. No stridor. No respiratory distress.  Abdominal: He exhibits no distension.    Musculoskeletal: He exhibits no edema.  Neurological: He is alert and oriented to person, place, and time.  Skin: Skin is warm and dry.  Psychiatric: He has a normal mood and affect.  Nursing note and vitals reviewed.    ED Treatments / Results  Labs (all labs ordered are listed, but only abnormal results are displayed) Labs Reviewed  I-STAT CHEM 8, ED - Abnormal; Notable for the following components:      Result Value   Glucose, Bld 102 (*)    Calcium, Ion 1.14 (*)    All other components within normal limits     Radiology Dg Abd Acute W/chest  Result Date: 08/24/2017 CLINICAL DATA:  Nausea, vomiting, abdominal pain EXAM: DG ABDOMEN ACUTE W/ 1V CHEST COMPARISON:  08/20/2017 FINDINGS: Cardiomegaly.  Bibasilar atelectasis.  No effusions. Mildly prominent small bowel loops in the mid abdomen. Gas is noted within decompressed colon. Cannot exclude partial small bowel obstruction. No free air organomegaly. IMPRESSION: Dilated small bowel loops in the mid abdomen. Cannot exclude partial small bowel obstruction. Cardiomegaly, bibasilar atelectasis. Electronically Signed   By: Rolm Baptise M.D.   On: 08/24/2017 14:53    Procedures Procedures (including critical care time)  Medications Ordered in ED Medications - No data  to display   Initial Impression / Assessment and Plan / ED Course  I have reviewed the triage vital signs and the nursing notes.  Pertinent labs & imaging results that were available during my care of the patient were reviewed by me and considered in my medical decision making (see chart for details).  Chart reviewed after initial evaluation, notable for subarachnoid hemorrhage last week.  4:51 PM Patient awake alert, has been drinking, with no nausea though the x-ray suggests inability to exclude bowel obstruction, this seems unlikely given the patient's recent p.o. intake, absence of ongoing complaints. There is some suspicion for recent ileus, given the patient's description of nausea, bowel discomfort earlier today, but given this resolution of symptoms, reassuring  labs, unremarkable vital signs, and his current residence in a nursing facility, patient is appropriate for discharge with close outpatient follow-up.  Final Clinical Impressions(s) / ED Diagnoses  Abdominal pain Nausea and vomiting   Carmin Muskrat, MD 08/24/17 1651    Carmin Muskrat, MD 08/24/17 [phone]

## 2017-08-24 NOTE — Discharge Instructions (Signed)
As discussed, your evaluation today has been largely reassuring.  But, it is important that you monitor your condition carefully, and do not hesitate to return to the ED if you develop new, or concerning changes in your condition. ? ?Otherwise, please follow-up with your physician for appropriate ongoing care. ? ?

## 2017-08-24 NOTE — ED Triage Notes (Signed)
Pt was brought in [facility] due to vomiting large amount of green vomitus. Pt admitted yesterday to [facility] due to fall with subarachnoid hemm. Pt reports that he did not feel well this morning and did not eat breakfast. Pt has an abdominal hernia in which he feels has enlarged. Pt vomited a large amount of green substance prior to coming to ED and reports he feels better at this time . Pt has a colostomy bag

## 2017-08-25 ENCOUNTER — Encounter (HOSPITAL_COMMUNITY)
Admission: RE | Admit: 2017-08-25 | Discharge: 2017-08-25 | Disposition: A | Discharge status: Home / Self Care | Payer: Medicare Other | Source: Skilled Nursing Facility | Attending: Internal Medicine | Admitting: Internal Medicine

## 2017-08-25 ENCOUNTER — Non-Acute Institutional Stay (SKILLED_NURSING_FACILITY): Payer: Medicare Other | Admitting: Internal Medicine

## 2017-08-25 ENCOUNTER — Encounter: Payer: Self-pay | Admitting: Internal Medicine

## 2017-08-25 DIAGNOSIS — I10 Essential (primary) hypertension: Secondary | ICD-10-CM | POA: Diagnosis not present

## 2017-08-25 DIAGNOSIS — C189 Malignant neoplasm of colon, unspecified: Secondary | ICD-10-CM

## 2017-08-25 DIAGNOSIS — Z5189 Encounter for other specified aftercare: Secondary | ICD-10-CM | POA: Insufficient documentation

## 2017-08-25 DIAGNOSIS — I609 Nontraumatic subarachnoid hemorrhage, unspecified: Secondary | ICD-10-CM | POA: Diagnosis not present

## 2017-08-25 DIAGNOSIS — I482 Chronic atrial fibrillation, unspecified: Secondary | ICD-10-CM

## 2017-08-25 DIAGNOSIS — I608 Other nontraumatic subarachnoid hemorrhage: Secondary | ICD-10-CM | POA: Insufficient documentation

## 2017-08-25 DIAGNOSIS — N3000 Acute cystitis without hematuria: Secondary | ICD-10-CM

## 2017-08-25 DIAGNOSIS — Z7901 Long term (current) use of anticoagulants: Secondary | ICD-10-CM | POA: Insufficient documentation

## 2017-08-25 LAB — CBC WITH DIFFERENTIAL/PLATELET
BASOS PCT: 0 %
Basophils Absolute: 0 10*3/uL (ref 0.0–0.1)
Eosinophils Absolute: 0 10*3/uL (ref 0.0–0.7)
Eosinophils Relative: 0 %
HCT: 47.7 % (ref 39.0–52.0)
HEMOGLOBIN: 16.3 g/dL (ref 13.0–17.0)
LYMPHS PCT: 7 %
Lymphs Abs: 0.9 10*3/uL (ref 0.7–4.0)
MCH: 32.9 pg (ref 26.0–34.0)
MCHC: 34.2 g/dL (ref 30.0–36.0)
MCV: 96.2 fL (ref 78.0–100.0)
Monocytes Absolute: 1 10*3/uL (ref 0.1–1.0)
Monocytes Relative: 8 %
NEUTROS ABS: 10.9 10*3/uL — AB (ref 1.7–7.7)
Neutrophils Relative %: 85 %
Platelets: 276 10*3/uL (ref 150–400)
RBC: 4.96 MIL/uL (ref 4.22–5.81)
RDW: 12.9 % (ref 11.5–15.5)
WBC: 13 10*3/uL — ABNORMAL HIGH (ref 4.0–10.5)

## 2017-08-25 LAB — BASIC METABOLIC PANEL
ANION GAP: 14 (ref 5–15)
BUN: 16 mg/dL (ref 6–20)
CHLORIDE: 99 mmol/L — AB (ref 101–111)
CO2: 23 mmol/L (ref 22–32)
Calcium: 8.9 mg/dL (ref 8.9–10.3)
Creatinine, Ser: 0.79 mg/dL (ref 0.61–1.24)
GFR calc Af Amer: >60 mL/min (ref >60)
GFR calc non Af Amer: >60 mL/min (ref >60)
Glucose, Bld: 115 mg/dL — ABNORMAL HIGH (ref 65–99)
POTASSIUM: 4.1 mmol/L (ref 3.5–5.1)
Sodium: 136 mmol/L (ref 135–145)

## 2017-08-25 NOTE — Progress Notes (Signed)
Provider:  Veleta Miners Location:   [place] Room Number: 130/P Place of Service:  SNF (31)  PCP: Rosita Fire, MD Patient Care Team: Rosita Fire, MD as PCP - General (Internal Medicine) Gala Romney Cristopher Estimable, MD as Consulting Physician (Gastroenterology)  Extended Emergency Contact Information Primary Emergency Contact: Roberts,Nancy Address: [address]          [place], [place] [address] [place] Phone: [phone] Relation: Sister  Code Status: Full Code Goals of Care: Advanced Directive information Advanced Directives 08/25/2017  Does Patient Have a Medical Advance Directive? Yes  Type of Advance Directive (No Data)  Does patient want to make changes to medical advance directive? No - Patient declined  Would patient like information on creating a medical advance directive? No - Patient declined      Chief Complaint  Patient presents with  . New Admit To SNF    New Admission Visit    HPI: Patient is a 84 y.o. male seen today for admission to SNF for therapy after staying in the hospital from 01/09-01/12  Patient has a history of colon cancer status post partial colectomy and permanent colostomy in 1998, atrial fibrillation on chronic anticoagulation with Coumadin, hypertension, And Primary Open Angle Glaucoma  Patient was brought to the hospital after he fell at home.  Patient lives with his sister.  He said he is that he has fallen a number of times before due to losing his balance.  But this time he fell and could not get and felt very weak.  He denies passing out, chest pain, shortness of breath In the emergency room the CT of the head showed small subarachnoid hemorrhage in the anterior  fissure.  Neurosurgery was consulted they recommended to discontinue Coumadin and follow him clinically. He was also given vitamin K and FFP in the emergency room. Repeat CT scan showed progression of the hemorrhage. Patient also had a  positive UA and was started on antibiotics.  Yesterday patient had acute abdominal pain with nausea vomiting and was sent to the emergency room.  His abdominal x-ray showed questionable SBO.  But since patient started feeling better was sent back to the facility.  He denies any abdominal pain nausea or vomiting.  Patient is a retired Network engineer.  He is has never been married and has no kids. He lives with his sister. Walks with the Kasandra Knudsen and has had Number of falls. Past Medical History:  Diagnosis Date  . At risk for falls   . Atrial fibrillation ([place])   . Chronic anticoagulation 2002  . Colon carcinoma ([place]) 1998   s/p partial colectomy and permanent colostomyin 1998  . Colostomy in place [facility])   . Glaucoma   . Glaucoma   . Hypertension   . Nontraumatic subarachnoid hemorrhage ([place])   . Overweight(278.02)   . Paroxysmal atrial fibrillation ([place]) 2002   Single episode by history during a noncardiac illness  . UTI (urinary tract infection)    Past Surgical History:  Procedure Laterality Date  . ABDOMINOPERINEAL PROCTOCOLECTOMY  1998   colon carcinoma; permanent colostomy  . CATARACT EXTRACTION     Bilateral  . COLONOSCOPY  11/2007   Dr. Juanda Crumble [facility], via ostomy. unremarkable.    reports that  has never smoked. he has never used smokeless tobacco. He reports that he does not drink alcohol or use drugs. Social History   Socioeconomic History  . Marital status: Married    Spouse  name: Not on file  . Number of children: Not on file  . Years of education: Not on file  . Highest education level: Not on file  Social Needs  . Financial resource strain: Not on file  . Food insecurity - worry: Not on file  . Food insecurity - inability: Not on file  . Transportation needs - medical: Not on file  . Transportation needs - non-medical: Not on file  Occupational History  . Occupation: PHD  Tobacco Use  . Smoking status: Never Smoker  . Smokeless  tobacco: Never Used  Substance and Sexual Activity  . Alcohol use: No    Alcohol/week: 0.0 oz  . Drug use: No  . Sexual activity: Not on file  Other Topics Concern  . Not on file  Social History Narrative  . Not on file    Functional Status Survey:    Family History  Problem Relation Age of Onset  . Cancer Mother        gallbladder  . Uterine cancer Maternal Aunt   . Pancreatic cancer Brother   . Liver cancer Sister   . Other Brother        amylodosis  . Kidney cancer Sister     Health Maintenance  Topic Date Due  . TETANUS/TDAP  09/25/2017 (Originally 03/24/1947)  . PNA vac Low Risk Adult (1 of 2 - PCV13) 09/25/2017 (Originally 03/25/1995)  . INFLUENZA VACCINE  Completed    No Known Allergies  Allergies as of 08/25/2017   No Known Allergies     Medication List    Notice   This visit is during an admission. Changes to the med list made in this visit will be reflected in the After Visit Summary of the admission.     Review of Systems  Review of Systems  Constitutional: Negative for activity change, appetite change, chills, diaphoresis, fatigue and fever.  HENT: Negative for mouth sores, postnasal drip, rhinorrhea, sinus pain and sore throat.  Positive for Dry Mouth Respiratory: Negative for apnea, cough, chest tightness, shortness of breath and wheezing.   Cardiovascular: Negative for chest pain, palpitations and leg swelling.  Gastrointestinal: Negative for abdominal distention, abdominal pain, constipation, diarrhea, nausea and vomiting.  Genitourinary: Negative for dysuria and frequency.  Musculoskeletal: Negative for arthralgias, joint swelling and myalgias.  Skin: Negative for rash.  Neurological: Negative for dizziness, syncope, weakness, light-headedness and numbness.  Psychiatric/Behavioral: Negative for behavioral problems, confusion and sleep disturbance.     Vitals:   08/25/17 1030  BP: (!) 142/74  Pulse: 83  Resp: 18  Temp: 97.8 F (36.6 C)    TempSrc: Oral   There is no height or weight on file to calculate BMI. Physical Exam  Constitutional: He is oriented to person, place, and time. He appears well-developed and well-nourished.  HENT:  Head: Normocephalic.  Mouth/Throat: Mucous membranes are dry.  Eyes: Pupils are equal, round, and reactive to light.  Neck: Neck supple.  Cardiovascular: Normal rate and normal heart sounds. An irregular rhythm present.  Pulmonary/Chest: Effort normal and breath sounds normal. No respiratory distress. He has no wheezes. He has no rales.  Abdominal: Soft. Bowel sounds are normal. He exhibits no distension. There is no tenderness. There is no rebound.  Colostomy bag in place  Musculoskeletal:  Chronic Venous Stasis changes in Both LE Left more then Right  Lymphadenopathy:    He has no cervical adenopathy.  Neurological: He is alert and oriented to person, place, and time.  No Focal  Deficits. Had Good Strength in all extremities  Skin: Skin is warm.  Has very Dry Skin in LE  Psychiatric: He has a normal mood and affect. His behavior is normal. Thought content normal.    Labs reviewed: Basic Metabolic Panel: Recent Labs    08/20/17 1900 08/21/17 0539 08/24/17 1514 08/25/17 0400  NA 136 135 139 136  K 3.7 4.2 4.3 4.1  CL 105 103 101 99*  CO2 24 23  --  23  GLUCOSE 101* 97 102* 115*  BUN 15 13 15 16   CREATININE 0.90 0.92 0.80 0.79  CALCIUM 8.7* 8.5*  --  8.9   Liver Function Tests: Recent Labs    08/20/17 1900 08/21/17 0539  AST 24 20  ALT 16* 13*  ALKPHOS 93 81  BILITOT 0.6 0.9  PROT 7.6 6.6  ALBUMIN 3.2* 2.8*   No results for input(s): LIPASE, AMYLASE in the last 8760 hours. No results for input(s): AMMONIA in the last 8760 hours. CBC: Recent Labs    08/20/17 1900 08/21/17 0539 08/24/17 1514 08/25/17 0400  WBC 16.5* 15.6*  --  13.0*  NEUTROABS 13.9*  --   --  10.9*  HGB 13.4 12.1* 15.3 16.3  HCT 40.6 37.2* 45.0 47.7  MCV 97.4 97.9  --  96.2  PLT 282 249   --  276   Cardiac Enzymes: Recent Labs    08/20/17 1900  TROPONINI <0.03   BNP: Invalid input(s): POCBNP No results found for: HGBA1C Lab Results  Component Value Date   TSH 1.539 06/04/2011   No results found for: VITAMINB12 No results found for: FOLATE No results found for: IRON, TIBC, FERRITIN  Imaging and Procedures obtained prior to SNF admission: No results found.  Assessment/Plan  Chronic atrial fibrillation  Rate is controlled on long-acting Cardizem Coumadin is discontinued right now with his history of recurrent falls and subarachnoid hemorrhage He Follows with Cardiology ? SBO on Abdominal Xray Patient is completely asymptomatic If symptoms return will consider CT scan Discussed with the patient UTI Patient is on Antibiotics and his Culture is pending. White Count Mildly Elevated  will Follow Essential hypertension Blood pressure controlled on Cardizem  Colon carcinoma  Status post colostomy   SAH (subarachnoid hemorrhage)  Per neurosurgery no further workup right now  Gait impairment Discussed with therapy patient does have good strength in his extremities we will do therapy and see if it improves his gait.  Patient wants to go home with his sister Family/ staff Communication:   Labs/tests ordered:  Total time spent in this patient care encounter was 45_ minutes; greater than 50% of the visit spent counseling patient, reviewing records , Labs and coordinating care for problems addressed at this encounter.

## 2017-08-28 ENCOUNTER — Other Ambulatory Visit: Payer: Self-pay

## 2017-08-28 ENCOUNTER — Inpatient Hospital Stay (HOSPITAL_COMMUNITY)
Admission: EM | Admit: 2017-08-28 | Discharge: 2017-09-02 | DRG: 388 | Disposition: A | Discharge status: Hospice | Payer: Medicare Other | Attending: Internal Medicine | Admitting: Internal Medicine

## 2017-08-28 ENCOUNTER — Encounter (HOSPITAL_COMMUNITY): Payer: Self-pay | Admitting: Emergency Medicine

## 2017-08-28 ENCOUNTER — Emergency Department (HOSPITAL_COMMUNITY): Payer: Medicare Other

## 2017-08-28 DIAGNOSIS — J690 Pneumonitis due to inhalation of food and vomit: Secondary | ICD-10-CM | POA: Diagnosis not present

## 2017-08-28 DIAGNOSIS — Z7189 Other specified counseling: Secondary | ICD-10-CM

## 2017-08-28 DIAGNOSIS — N136 Pyonephrosis: Secondary | ICD-10-CM | POA: Diagnosis present

## 2017-08-28 DIAGNOSIS — E869 Volume depletion, unspecified: Secondary | ICD-10-CM | POA: Diagnosis present

## 2017-08-28 DIAGNOSIS — Z9842 Cataract extraction status, left eye: Secondary | ICD-10-CM | POA: Diagnosis not present

## 2017-08-28 DIAGNOSIS — I10 Essential (primary) hypertension: Secondary | ICD-10-CM | POA: Diagnosis not present

## 2017-08-28 DIAGNOSIS — K432 Incisional hernia without obstruction or gangrene: Secondary | ICD-10-CM | POA: Diagnosis present

## 2017-08-28 DIAGNOSIS — J9690 Respiratory failure, unspecified, unspecified whether with hypoxia or hypercapnia: Secondary | ICD-10-CM

## 2017-08-28 DIAGNOSIS — Z79899 Other long term (current) drug therapy: Secondary | ICD-10-CM | POA: Diagnosis not present

## 2017-08-28 DIAGNOSIS — Z452 Encounter for adjustment and management of vascular access device: Secondary | ICD-10-CM | POA: Diagnosis not present

## 2017-08-28 DIAGNOSIS — K219 Gastro-esophageal reflux disease without esophagitis: Secondary | ICD-10-CM | POA: Diagnosis present

## 2017-08-28 DIAGNOSIS — Z789 Other specified health status: Secondary | ICD-10-CM | POA: Diagnosis not present

## 2017-08-28 DIAGNOSIS — I4891 Unspecified atrial fibrillation: Secondary | ICD-10-CM | POA: Diagnosis not present

## 2017-08-28 DIAGNOSIS — N289 Disorder of kidney and ureter, unspecified: Secondary | ICD-10-CM | POA: Diagnosis not present

## 2017-08-28 DIAGNOSIS — N1339 Other hydronephrosis: Secondary | ICD-10-CM | POA: Diagnosis not present

## 2017-08-28 DIAGNOSIS — N170 Acute kidney failure with tubular necrosis: Secondary | ICD-10-CM | POA: Diagnosis present

## 2017-08-28 DIAGNOSIS — D72828 Other elevated white blood cell count: Secondary | ICD-10-CM

## 2017-08-28 DIAGNOSIS — E876 Hypokalemia: Secondary | ICD-10-CM | POA: Diagnosis present

## 2017-08-28 DIAGNOSIS — Z0189 Encounter for other specified special examinations: Secondary | ICD-10-CM

## 2017-08-28 DIAGNOSIS — Z933 Colostomy status: Secondary | ICD-10-CM | POA: Diagnosis not present

## 2017-08-28 DIAGNOSIS — N189 Chronic kidney disease, unspecified: Secondary | ICD-10-CM | POA: Diagnosis present

## 2017-08-28 DIAGNOSIS — D72829 Elevated white blood cell count, unspecified: Secondary | ICD-10-CM | POA: Diagnosis present

## 2017-08-28 DIAGNOSIS — N179 Acute kidney failure, unspecified: Secondary | ICD-10-CM | POA: Diagnosis not present

## 2017-08-28 DIAGNOSIS — Z9841 Cataract extraction status, right eye: Secondary | ICD-10-CM | POA: Diagnosis not present

## 2017-08-28 DIAGNOSIS — K56609 Unspecified intestinal obstruction, unspecified as to partial versus complete obstruction: Secondary | ICD-10-CM

## 2017-08-28 DIAGNOSIS — R0602 Shortness of breath: Secondary | ICD-10-CM

## 2017-08-28 DIAGNOSIS — K8020 Calculus of gallbladder without cholecystitis without obstruction: Secondary | ICD-10-CM | POA: Diagnosis present

## 2017-08-28 DIAGNOSIS — R918 Other nonspecific abnormal finding of lung field: Secondary | ICD-10-CM | POA: Diagnosis not present

## 2017-08-28 DIAGNOSIS — Z66 Do not resuscitate: Secondary | ICD-10-CM | POA: Diagnosis present

## 2017-08-28 DIAGNOSIS — Z515 Encounter for palliative care: Secondary | ICD-10-CM | POA: Diagnosis not present

## 2017-08-28 DIAGNOSIS — I482 Chronic atrial fibrillation: Secondary | ICD-10-CM | POA: Diagnosis present

## 2017-08-28 DIAGNOSIS — R0989 Other specified symptoms and signs involving the circulatory and respiratory systems: Secondary | ICD-10-CM | POA: Diagnosis not present

## 2017-08-28 DIAGNOSIS — J90 Pleural effusion, not elsewhere classified: Secondary | ICD-10-CM | POA: Diagnosis not present

## 2017-08-28 DIAGNOSIS — A419 Sepsis, unspecified organism: Secondary | ICD-10-CM | POA: Diagnosis not present

## 2017-08-28 DIAGNOSIS — Z85038 Personal history of other malignant neoplasm of large intestine: Secondary | ICD-10-CM

## 2017-08-28 DIAGNOSIS — I609 Nontraumatic subarachnoid hemorrhage, unspecified: Secondary | ICD-10-CM

## 2017-08-28 DIAGNOSIS — K5651 Intestinal adhesions [bands], with partial obstruction: Principal | ICD-10-CM | POA: Diagnosis present

## 2017-08-28 DIAGNOSIS — H409 Unspecified glaucoma: Secondary | ICD-10-CM | POA: Diagnosis present

## 2017-08-28 DIAGNOSIS — J9601 Acute respiratory failure with hypoxia: Secondary | ICD-10-CM | POA: Diagnosis not present

## 2017-08-28 DIAGNOSIS — E872 Acidosis: Secondary | ICD-10-CM | POA: Diagnosis present

## 2017-08-28 DIAGNOSIS — E875 Hyperkalemia: Secondary | ICD-10-CM | POA: Diagnosis not present

## 2017-08-28 DIAGNOSIS — N1330 Unspecified hydronephrosis: Secondary | ICD-10-CM | POA: Diagnosis not present

## 2017-08-28 DIAGNOSIS — J9811 Atelectasis: Secondary | ICD-10-CM | POA: Diagnosis not present

## 2017-08-28 DIAGNOSIS — Z978 Presence of other specified devices: Secondary | ICD-10-CM | POA: Diagnosis not present

## 2017-08-28 DIAGNOSIS — E870 Hyperosmolality and hypernatremia: Secondary | ICD-10-CM | POA: Diagnosis present

## 2017-08-28 DIAGNOSIS — I480 Paroxysmal atrial fibrillation: Secondary | ICD-10-CM | POA: Diagnosis present

## 2017-08-28 DIAGNOSIS — W19XXXD Unspecified fall, subsequent encounter: Secondary | ICD-10-CM | POA: Diagnosis present

## 2017-08-28 DIAGNOSIS — Z9049 Acquired absence of other specified parts of digestive tract: Secondary | ICD-10-CM

## 2017-08-28 DIAGNOSIS — K56699 Other intestinal obstruction unspecified as to partial versus complete obstruction: Secondary | ICD-10-CM | POA: Diagnosis not present

## 2017-08-28 DIAGNOSIS — J984 Other disorders of lung: Secondary | ICD-10-CM | POA: Diagnosis not present

## 2017-08-28 DIAGNOSIS — J9600 Acute respiratory failure, unspecified whether with hypoxia or hypercapnia: Secondary | ICD-10-CM | POA: Diagnosis not present

## 2017-08-28 DIAGNOSIS — N210 Calculus in bladder: Secondary | ICD-10-CM | POA: Diagnosis present

## 2017-08-28 DIAGNOSIS — J189 Pneumonia, unspecified organism: Secondary | ICD-10-CM | POA: Diagnosis not present

## 2017-08-28 DIAGNOSIS — Z4682 Encounter for fitting and adjustment of non-vascular catheter: Secondary | ICD-10-CM | POA: Diagnosis not present

## 2017-08-28 DIAGNOSIS — R6521 Severe sepsis with septic shock: Secondary | ICD-10-CM | POA: Diagnosis not present

## 2017-08-28 DIAGNOSIS — I129 Hypertensive chronic kidney disease with stage 1 through stage 4 chronic kidney disease, or unspecified chronic kidney disease: Secondary | ICD-10-CM | POA: Diagnosis present

## 2017-08-28 LAB — CBC WITH DIFFERENTIAL/PLATELET
Basophils Absolute: 0 10*3/uL (ref 0.0–0.1)
Basophils Relative: 0 %
EOS ABS: 0.1 10*3/uL (ref 0.0–0.7)
EOS PCT: 1 %
HCT: 44.9 % (ref 39.0–52.0)
Hemoglobin: 14.8 g/dL (ref 13.0–17.0)
LYMPHS ABS: 1.5 10*3/uL (ref 0.7–4.0)
Lymphocytes Relative: 8 %
MCH: 31.9 pg (ref 26.0–34.0)
MCHC: 33 g/dL (ref 30.0–36.0)
MCV: 96.8 fL (ref 78.0–100.0)
MONO ABS: 2.1 10*3/uL — AB (ref 0.1–1.0)
Monocytes Relative: 11 %
Neutro Abs: 15.4 10*3/uL — ABNORMAL HIGH (ref 1.7–7.7)
Neutrophils Relative %: 80 %
PLATELETS: 270 10*3/uL (ref 150–400)
RBC: 4.64 MIL/uL (ref 4.22–5.81)
RDW: 13.1 % (ref 11.5–15.5)
WBC: 19.1 10*3/uL — AB (ref 4.0–10.5)

## 2017-08-28 LAB — COMPREHENSIVE METABOLIC PANEL
ALT: 52 U/L (ref 17–63)
ANION GAP: 11 (ref 5–15)
AST: 57 U/L — ABNORMAL HIGH (ref 15–41)
Albumin: 3.2 g/dL — ABNORMAL LOW (ref 3.5–5.0)
Alkaline Phosphatase: 116 U/L (ref 38–126)
BUN: 20 mg/dL (ref 6–20)
CHLORIDE: 101 mmol/L (ref 101–111)
CO2: 26 mmol/L (ref 22–32)
Calcium: 9.3 mg/dL (ref 8.9–10.3)
Creatinine, Ser: 1.21 mg/dL (ref 0.61–1.24)
GFR, EST NON AFRICAN AMERICAN: 52 mL/min — AB (ref >60)
Glucose, Bld: 110 mg/dL — ABNORMAL HIGH (ref 65–99)
POTASSIUM: 3.3 mmol/L — AB (ref 3.5–5.1)
SODIUM: 138 mmol/L (ref 135–145)
Total Bilirubin: 1.2 mg/dL (ref 0.3–1.2)
Total Protein: 8.3 g/dL — ABNORMAL HIGH (ref 6.5–8.1)

## 2017-08-28 LAB — MRSA PCR SCREENING: MRSA by PCR: POSITIVE — AB

## 2017-08-28 LAB — LIPASE, BLOOD: LIPASE: 22 U/L (ref 11–51)

## 2017-08-28 LAB — MAGNESIUM: Magnesium: 2.4 mg/dL (ref 1.7–2.4)

## 2017-08-28 MED ORDER — IOPAMIDOL (ISOVUE-300) INJECTION 61%
100.0000 mL | Freq: Once | INTRAVENOUS | Status: AC | PRN
  Administered 2017-08-28: 100 mL via INTRAVENOUS

## 2017-08-28 MED ORDER — MORPHINE SULFATE (PF) 2 MG/ML IV SOLN
2.0000 mg | INTRAVENOUS | Status: DC | PRN
  Administered 2017-08-28 – 2017-08-29 (×3): 2 mg via INTRAVENOUS
  Filled 2017-08-28 (×3): qty 1

## 2017-08-28 MED ORDER — POTASSIUM CHLORIDE IN NACL 40-0.9 MEQ/L-% IV SOLN
INTRAVENOUS | Status: DC
  Administered 2017-08-28 – 2017-08-29 (×2): 100 mL/h via INTRAVENOUS

## 2017-08-28 MED ORDER — MUPIROCIN 2 % EX OINT
1.0000 "application " | TOPICAL_OINTMENT | Freq: Two times a day (BID) | CUTANEOUS | Status: DC
  Administered 2017-08-28 – 2017-09-01 (×9): 1 via NASAL
  Filled 2017-08-28 (×3): qty 22

## 2017-08-28 MED ORDER — ONDANSETRON HCL 4 MG/2ML IJ SOLN
4.0000 mg | Freq: Once | INTRAMUSCULAR | Status: AC
  Administered 2017-08-28: 4 mg via INTRAVENOUS
  Filled 2017-08-28: qty 2

## 2017-08-28 MED ORDER — ONDANSETRON HCL 4 MG PO TABS: 4.0000 mg | ORAL_TABLET | Freq: Four times a day (QID) | ORAL | Status: DC | PRN

## 2017-08-28 MED ORDER — SODIUM CHLORIDE 0.9% FLUSH
3.0000 mL | Freq: Two times a day (BID) | INTRAVENOUS | Status: DC
  Administered 2017-08-29 – 2017-08-31 (×4): 3 mL via INTRAVENOUS

## 2017-08-28 MED ORDER — CHLORHEXIDINE GLUCONATE 0.12 % MT SOLN
15.0000 mL | Freq: Two times a day (BID) | OROMUCOSAL | Status: DC
  Administered 2017-08-28 – 2017-08-30 (×4): 15 mL via OROMUCOSAL
  Filled 2017-08-28 (×3): qty 15

## 2017-08-28 MED ORDER — ORAL CARE MOUTH RINSE
15.0000 mL | Freq: Two times a day (BID) | OROMUCOSAL | Status: DC
  Administered 2017-08-28: 15 mL via OROMUCOSAL

## 2017-08-28 MED ORDER — CHLORHEXIDINE GLUCONATE CLOTH 2 % EX PADS
6.0000 | MEDICATED_PAD | Freq: Every day | CUTANEOUS | Status: DC
  Administered 2017-08-30: 6 via TOPICAL

## 2017-08-28 MED ORDER — POTASSIUM CHLORIDE 10 MEQ/100ML IV SOLN
10.0000 meq | INTRAVENOUS | Status: AC
Start: 2017-08-28 — End: 2017-08-28
  Administered 2017-08-28 (×4): 10 meq via INTRAVENOUS
  Filled 2017-08-28 (×4): qty 100

## 2017-08-28 MED ORDER — DILTIAZEM HCL 25 MG/5ML IV SOLN: 10.0000 mg | INTRAVENOUS | Status: DC | PRN

## 2017-08-28 MED ORDER — PANTOPRAZOLE SODIUM 40 MG IV SOLR
40.0000 mg | INTRAVENOUS | Status: DC
  Administered 2017-08-28 – 2017-08-30 (×3): 40 mg via INTRAVENOUS
  Filled 2017-08-28 (×3): qty 40

## 2017-08-28 MED ORDER — SODIUM CHLORIDE 0.9 % IV SOLN
INTRAVENOUS | Status: DC
  Administered 2017-08-28: 11:00:00 via INTRAVENOUS

## 2017-08-28 MED ORDER — ONDANSETRON HCL 4 MG/2ML IJ SOLN
4.0000 mg | Freq: Four times a day (QID) | INTRAMUSCULAR | Status: DC | PRN
  Administered 2017-08-29: 4 mg via INTRAVENOUS
  Filled 2017-08-28: qty 2

## 2017-08-28 MED ORDER — LATANOPROST 0.005 % OP SOLN
1.0000 [drp] | Freq: Every day | OPHTHALMIC | Status: DC
  Administered 2017-08-28 – 2017-08-31 (×4): 1 [drp] via OPHTHALMIC
  Filled 2017-08-28: qty 2.5

## 2017-08-28 MED ORDER — TIMOLOL HEMIHYDRATE 0.25 % OP SOLN
1.0000 [drp] | Freq: Two times a day (BID) | OPHTHALMIC | Status: DC
  Administered 2017-08-28 – 2017-08-29 (×2): 1 [drp] via OPHTHALMIC
  Filled 2017-08-28: qty 5

## 2017-08-28 NOTE — H&P (Signed)
TRH H&P   Patient Demographics:    Joel Bishop, is a 84 y.o. male  MRN: [id number]   DOB - 1928-01-06  Admit Date - 08/28/2017  Outpatient Primary MD for the patient is Rosita Fire, MD  Referring MD: Dr. Peyton Bottoms  Outpatient Specialists: None   Patient coming from: [facility] skilled [facility]  Chief Complaint  Patient presents with  . Emesis      HPI:    Joel Bishop  is a 84 y.o. male, hospitalized in the past week (From 1/-1/12) With fall at home and had a small subarachnoid hemorrhage in the anterior fissure on head CT. Hospitalist discussed with neurosurgery who recommended Discontinue Coumadin and monitor clinically. Patient given vitamin K and FFP for INR reversal and monitored without any Neurological deficit. He was also found to have UA and treated with ciprofloxacin (completed course On 1/15).  Other medical problems include history of colon cancer status post partial colectomy and permanent colostomy in 1998, A. Fib on Coumadin prior to recent hospitalization, hypertension , GERD and glaucoma. Patient Was discharged to SNF on 1/12 but returned to ED on 1/13 with nausea, vomiting and abdominal fullness. Abdominal x-ray Suggests inability to rule out underlying bowel obstruction but since Patient's symptoms had resolved in the ED aith normal abdominal exam and Patient tolerating diet without any problem he was discharged back to SNF. He was then seen by physician at the facility next day and again at that time he did not have any complaint and was tolerating diet without a problem. Patient reported that he has Had good by mouth intake since he has been at the facility with off-and-on abdominal Fullness, nausea and vomiting. This morning he had watery green emesis which partially relieved his abdominal pain. However by later this morning he again had dull aching  abdominal pain around the colostomy site associated with nausea. Patient reports having prior bowel obstructions with similar pain but this time the pain is worse.Patient reports That he has had low colostomy output in the past few days and none since yesterday. Patient denies any fevers or chills, headache, blurred vision, dizziness, palpitations, shortness of breath, chest pain, dysuria, weakness or numbness of his extremities.   Course in the ED Blood pressure mildly elevated, remaining vitals stable. Blood work showed WBC of 19K, potassium of 3.3  A CT of the abdomen and pelvis done in theEsD showe dA severe small bowel obstruction with transition zone in the lower abdomen/    Review of systems:    In addition to the HPI above,  No Fever-chills, No Headache, No changes with Vision or hearing, Able to swallow without difficulty No Chest pain, Cough or Shortness of Breath,  Abdominal pain, nausea and vomiting +++,  Absent colostomy output No Blood in stool or Urine, No dysuria, No new skin rashes or bruises, No new joints pains-aches,  No new weakness, tingling, numbness in any extremity, No recent weight gain or loss, No polyuria, polydypsia or polyphagia, No significant Mental Stressors.  A full 10 point Review of Systems was done, except as stated above, all other Review of Systems were negative.   With Past History of the following :    Past Medical History:  Diagnosis Date  . At risk for falls   . Atrial fibrillation ([place])   . Chronic anticoagulation 2002  . Colon carcinoma ([place]) 1998   s/p partial colectomy and permanent colostomyin 1998  . Colostomy in place [facility])   . Glaucoma   . Glaucoma   . Hypertension   . Nontraumatic subarachnoid hemorrhage ([place])   . Overweight(278.02)   . Paroxysmal atrial fibrillation ([place]) 2002   Single episode by history during a noncardiac illness  . UTI (urinary tract infection)       Past Surgical History:  Procedure Laterality  Date  . ABDOMINOPERINEAL PROCTOCOLECTOMY  1998   colon carcinoma; permanent colostomy  . CATARACT EXTRACTION     Bilateral  . COLONOSCOPY  11/2007   Dr. Juanda Crumble [facility], via ostomy. unremarkable.      Social History:     Social History   Tobacco Use  . Smoking status: Never Smoker  . Smokeless tobacco: Never Used  Substance Use Topics  . Alcohol use: No    Alcohol/week: 0.0 oz     Lives - at SNF currently  Mobility - independent previously, current in rehab    Family History :     Family History  Problem Relation Age of Onset  . Cancer Mother        gallbladder  . Uterine cancer Maternal Aunt   . Pancreatic cancer Brother   . Liver cancer Sister   . Other Brother        amylodosis  . Kidney cancer Sister       Home Medications:   Prior to Admission medications   Medication Sig Start Date End Date Taking? Authorizing Provider  diltiazem (CARDIZEM CD) 240 MG 24 hr capsule Take 1 capsule (240 mg total) by mouth daily. 08/24/17  Yes Isaac Bliss, Rayford Halsted, MD  latanoprost (XALATAN) 0.005 % ophthalmic solution Place 1 drop into both eyes at bedtime.    Yes [provider]  pantoprazole (PROTONIX) 40 MG tablet Take 40 mg by mouth daily.   Yes [provider]  timolol (BETIMOL) 0.25 % ophthalmic solution Place 1 drop into the left eye 2 (two) times daily.    Yes [provider]  ciprofloxacin (CIPRO) 500 MG tablet Take 500 mg by mouth 2 (two) times daily.    [provider]     Allergies:    No Known Allergies   Physical Exam:   Vitals  Blood pressure (!) 150/88, pulse 76, temperature 98 F (36.7 C), temperature source Oral, resp. rate 18, height 5\' 6"  (1.676 m), weight 100.7 kg (222 lb), SpO2 100 %.   General elderly male in NAD HEENT: pupils reactive b/l, EOMI, no pallor, dry mucosa, supple neck, no cervical  lymphadenopathy Chest clear b/l, no added sounds CVS: S1&S2 irregular, no  murmurs, rubs or gallop  GI: soft, distended abdomen, absent bowel sounds, colostomy with minimal output, tender to palpation mainly around colostomy site. Musculoskeletal: warm, no edema CNS: alert and oriented, non focal     Data Review:    CBC Recent Labs  Lab 08/24/17 1514 08/25/17 0400 08/28/17 1009  WBC  --  13.0* 19.1*  HGB 15.3 16.3 14.8  HCT 45.0 47.7 44.9  PLT  --  276 270  MCV  --  96.2 96.8  MCH  --  32.9 31.9  MCHC  --  34.2 33.0  RDW  --  12.9 13.1  LYMPHSABS  --  0.9 1.5  MONOABS  --  1.0 2.1*  EOSABS  --  0.0 0.1  BASOSABS  --  0.0 0.0   ------------------------------------------------------------------------------------------------------------------  Chemistries  Recent Labs  Lab 08/24/17 1514 08/25/17 0400 08/28/17 1009  NA 139 136 138  K 4.3 4.1 3.3*  CL 101 99* 101  CO2  --  23 26  GLUCOSE 102* 115* 110*  BUN 15 16 20   CREATININE 0.80 0.79 1.21  CALCIUM  --  8.9 9.3  AST  --   --  57*  ALT  --   --  52  ALKPHOS  --   --  116  BILITOT  --   --  1.2   ------------------------------------------------------------------------------------------------------------------ estimated creatinine clearance is 47.8 mL/min (by C-G formula based on SCr of 1.21 mg/dL). ------------------------------------------------------------------------------------------------------------------ No results for input(s): TSH, T4TOTAL, T3FREE, THYROIDAB in the last 72 hours.  Invalid input(s): FREET3  Coagulation profile No results for input(s): INR, PROTIME in the last 168 hours. ------------------------------------------------------------------------------------------------------------------- No results for input(s): DDIMER in the last 72 hours. -------------------------------------------------------------------------------------------------------------------  Cardiac Enzymes No results for input(s): CKMB, TROPONINI, MYOGLOBIN in the last 168 hours.  Invalid  input(s): CK ------------------------------------------------------------------------------------------------------------------ No results found for: BNP   ---------------------------------------------------------------------------------------------------------------  Urinalysis    Component Value Date/Time   COLORURINE YELLOW 08/20/2017 1910   APPEARANCEUR CLOUDY (A) 08/20/2017 1910   LABSPEC 1.015 08/20/2017 1910   PHURINE 7.0 08/20/2017 1910   GLUCOSEU NEGATIVE 08/20/2017 1910   HGBUR SMALL (A) 08/20/2017 1910   BILIRUBINUR NEGATIVE 08/20/2017 1910   KETONESUR NEGATIVE 08/20/2017 1910   PROTEINUR 30 (A) 08/20/2017 1910   NITRITE POSITIVE (A) 08/20/2017 1910   LEUKOCYTESUR LARGE (A) 08/20/2017 1910    ----------------------------------------------------------------------------------------------------------------   Imaging Results:    Ct Abdomen Pelvis W Contrast  Result Date: 08/28/2017 CLINICAL DATA:  Decreased urine output. Small bowel obstruction. History of prostatectomy and colostomy in 1998 EXAM: CT ABDOMEN AND PELVIS WITH CONTRAST TECHNIQUE: Multidetector CT imaging of the abdomen and pelvis was performed using the standard protocol following bolus administration of intravenous contrast. CONTRAST:  171mL ISOVUE-300 IOPAMIDOL (ISOVUE-300) INJECTION 61% COMPARISON:  None. FINDINGS: Lower chest: Small left pleural effusion. Left basilar atelectasis. Mild right basilar atelectasis. Elevation of the right diaphragm. Hepatobiliary: No focal hepatic mass. Dystrophic calcification in the right hepatic lobe adjacent to the middle portal vein. Normal gallbladder. No intrahepatic or extrahepatic biliary ductal dilatation. Pancreas: Unremarkable. No pancreatic ductal dilatation or surrounding inflammatory changes. Spleen: Normal in size without focal abnormality. Adrenals/Urinary Tract: Adrenal glands are unremarkable. Kidneys are normal, without renal calculi, focal lesion, or  hydronephrosis. Multiple bladder calculi are noted. Stomach/Bowel: Severe small bowel dilatation measuring up to 5 cm in diameter with multiple air-fluid levels with a relative transition zone where there appears to be tethering in the lower abdomen/pelvis likely secondary to adhesions. Distal small bowel is decompressed. Colon is decompressed. No pneumatosis, pneumoperitoneum or portal venous gas. Left lower quadrant colostomy. Vascular/Lymphatic: Abdominal aortic atherosclerosis. Normal caliber abdominal aorta. No lymphadenopathy. Reproductive: Prior prostatectomy. Other: Left inguinal fat containing hernia. 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 Presacral soft tissue prominence likely related to posttreatment changes. Musculoskeletal: No acute osseous abnormality. No aggressive osseous  lesion. Ankylosis of bilateral sacroiliac joints. Degenerative disc disease with disc height loss at L3-4, L4-5 and L5-S1 with bilateral facet arthropathy. IMPRESSION: 1. Severe small bowel obstruction with a relative transition zone in the lower abdomen/pelvis likely secondary to adhesions. Small bowel measures up to 5 cm in diameter. 2.  Aortic Atherosclerosis (ICD10-I70.0). 3. 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 4.  Left lower quadrant colostomy. 5. Multiple bladder calculi. 6. Small left pleural effusion. Electronically Signed   By: Kathreen Devoid   On: 08/28/2017 12:12    My personal review of EKG: pending   Assessment & Plan:    Principal Problem:   SBO (small bowel obstruction) ([place]) Secondary to adhesions. Admit to med surg. Strict NPO. IV NS with kcl. NG tube being placed on ED for decompression.  Serial abdominal exam. Daily abdominal xray. Keep k >4. General surgery consulted and I will follow with their recommendations. Prn IV morphine for pain. zofran prn for nausea/ vomiting.   Preoperative evaluation Surgery  indicated if patient fails to improve or symptoms worsen. According to gupta perioperative cardiac risk score his risk for intra and post operative MI or  Cardiac arrest is 0.6%.  Check EKG. Recent 2d echo ( 08/21/2017) with normal EF of 60-65%, no WMA, trivial MR and mild to moderate pericardial effusion. patient is clinically euvolemic. He is not on any BB prior to admission and currently perioperative BB is not indicated.    Active Problems:   SAH (subarachnoid hemorrhage) (HCC) No neurological deficit. Taken off anticoagulation recently.    Hypokalemia replenish with IV KCL and in fluids. Keep k >4. Check mg.     AF (paroxysmal atrial fibrillation) (HCC) Rate controlled. Off anticoagulation after recent SAH. IV cardizemPRN.    Leucocytosis Possibly due to acute stress. Monitor for now.    Essential hypertension Monitor with prn IV hydralazine.  Glaucoma Resume home meds  GERD  IV PPI    DVT Prophylaxis SCDs   AM Labs Ordered, also please review Full Orders  Family Communication: Admission, patients condition and plan of care including tests being ordered have been discussed with the patient at bedside  Code Status Full code  Likely DC to Return to SNF  Condition GUARDED    Consults called: surgery   Admission status: Inpatient    Time spent in minutes : 60   Bhavin Monjaraz M.D on 08/28/2017 at 1:14 PM  Between 7am to 7pm - Pager - [phone]. After 7pm go to [email or web address] - password [email or web address]  [facility] - Office  [phone]

## 2017-08-28 NOTE — ED Provider Notes (Signed)
[facility] EMERGENCY DEPARTMENT Provider Note   CSN: [id number] Arrival date & time: 08/28/17  0915     History   Chief Complaint Chief Complaint  Patient presents with  . Emesis    HPI Joel Bishop is a 84 y.o. male with a history as outlined below, significant for distant history of colon cancer with permanent colostomy, hypertension, paroxysmal A. fib and recent subarachnoid hemorrhage which [facility] presenting with abdominal pain, distention and vomiting.  He states he has had little p.o. intake since arriving at the center as he is not fond of the food provided, but endorses having intermittent episodes of abdominal pain which is relieved with emesis.  He has had watery green emesis this morning x1.  This temporarily relieved his abdominal pain, but he states it is now starting to return.  He states his pain is worse than when he has his typical small bowel obstructions.  He was seen here several days ago for similar symptoms and an acute abdominal series suggested a possible early SBO.  He denies fevers or chills, denies headache or any other complaints.  He does report slow output from his colostomy.  The history is provided by the patient and the nursing home.    Past Medical History:  Diagnosis Date  . At risk for falls   . Atrial fibrillation ([place])   . Chronic anticoagulation 2002  . Colon carcinoma ([place]) 1998   s/p partial colectomy and permanent colostomyin 1998  . Colostomy in place [facility])   . Glaucoma   . Glaucoma   . Hypertension   . Nontraumatic subarachnoid hemorrhage ([place])   . Overweight(278.02)   . Paroxysmal atrial fibrillation ([place]) 2002   Single episode by history during a noncardiac illness  . UTI (urinary tract infection)     Patient Active Problem List   Diagnosis Date Noted  . SAH (subarachnoid hemorrhage) ([place]) 08/20/2017  . Encounter for therapeutic drug monitoring 09/27/2013  . Colon carcinoma ([place])   .  Hypertension   . Atrial fibrillation ([place]) 05/29/2011  . Chronic anticoagulation 05/29/2011    Past Surgical History:  Procedure Laterality Date  . ABDOMINOPERINEAL PROCTOCOLECTOMY  1998   colon carcinoma; permanent colostomy  . CATARACT EXTRACTION     Bilateral  . COLONOSCOPY  11/2007   Dr. Juanda Crumble Mueller/[facility], via ostomy. unremarkable.       Home Medications    Prior to Admission medications   Medication Sig Start Date End Date Taking? Authorizing Provider  diltiazem (CARDIZEM CD) 240 MG 24 hr capsule Take 1 capsule (240 mg total) by mouth daily. 08/24/17  Yes Isaac Bliss, Rayford Halsted, MD  latanoprost (XALATAN) 0.005 % ophthalmic solution Place 1 drop into both eyes at bedtime.    Yes [provider]  pantoprazole (PROTONIX) 40 MG tablet Take 40 mg by mouth daily.   Yes [provider]  timolol (BETIMOL) 0.25 % ophthalmic solution Place 1 drop into the left eye 2 (two) times daily.    Yes [provider]  ciprofloxacin (CIPRO) 500 MG tablet Take 500 mg by mouth 2 (two) times daily.    [provider]    Family History Family History  Problem Relation Age of Onset  . Cancer Mother        gallbladder  . Uterine cancer Maternal Aunt   . Pancreatic cancer Brother   . Liver cancer Sister   . Other Brother  amylodosis  . Kidney cancer Sister     Social History Social History   Tobacco Use  . Smoking status: Never Smoker  . Smokeless tobacco: Never Used  Substance Use Topics  . Alcohol use: No    Alcohol/week: 0.0 oz  . Drug use: No     Allergies   Patient has no known allergies.   Review of Systems Review of Systems  Constitutional: Positive for appetite change. Negative for fever.  HENT: Negative for congestion and sore throat.   Eyes: Negative.   Respiratory: Negative for chest tightness and shortness of breath.   Cardiovascular: Negative for chest pain.  Gastrointestinal: Positive for  abdominal distention, abdominal pain, nausea and vomiting.  Genitourinary: Negative.   Musculoskeletal: Negative for arthralgias, joint swelling and neck pain.  Skin: Negative.  Negative for rash and wound.  Neurological: Negative for dizziness, weakness, light-headedness, numbness and headaches.  Psychiatric/Behavioral: Negative.      Physical Exam Updated Vital Signs BP (!) 150/88   Pulse 76   Temp 98 F (36.7 C) (Oral)   Resp 18   Ht 5\' 6"  (1.676 m)   Wt 100.7 kg (222 lb)   SpO2 100%   BMI 35.83 kg/m   Physical Exam  Constitutional: He appears well-developed and well-nourished.  HENT:  Head: Normocephalic and atraumatic.  Eyes: Conjunctivae are normal.  Neck: Normal range of motion.  Cardiovascular: Normal rate, regular rhythm, normal heart sounds and intact distal pulses.  Pulmonary/Chest: Effort normal and breath sounds normal. He has no wheezes.  Abdominal: Soft. Bowel sounds are normal. He exhibits distension. He exhibits no mass. There is tenderness. There is no rebound and no guarding.    Increased tympany to percussion.  Bowel sounds are reduced but present.  Musculoskeletal: Normal range of motion.  Neurological: He is alert.  Skin: Skin is warm and dry.  Psychiatric: He has a normal mood and affect.  Nursing note and vitals reviewed.    ED Treatments / Results  Labs (all labs ordered are listed, but only abnormal results are displayed) Labs Reviewed  CBC WITH DIFFERENTIAL/PLATELET - Abnormal; Notable for the following components:      Result Value   WBC 19.1 (*)    Neutro Abs 15.4 (*)    Monocytes Absolute 2.1 (*)    All other components within normal limits  COMPREHENSIVE METABOLIC PANEL - Abnormal; Notable for the following components:   Potassium 3.3 (*)    Glucose, Bld 110 (*)    Total Protein 8.3 (*)    Albumin 3.2 (*)    AST 57 (*)    GFR calc non Af Amer 52 (*)    All other components within normal limits  LIPASE, BLOOD  URINALYSIS,  ROUTINE W REFLEX MICROSCOPIC    EKG  EKG Interpretation None       Radiology Ct Abdomen Pelvis W Contrast  Result Date: 08/28/2017 CLINICAL DATA:  Decreased urine output. Small bowel obstruction. History of prostatectomy and colostomy in 1998 EXAM: CT ABDOMEN AND PELVIS WITH CONTRAST TECHNIQUE: Multidetector CT imaging of the abdomen and pelvis was performed using the standard protocol following bolus administration of intravenous contrast. CONTRAST:  150mL ISOVUE-300 IOPAMIDOL (ISOVUE-300) INJECTION 61% COMPARISON:  None. FINDINGS: Lower chest: Small left pleural effusion. Left basilar atelectasis. Mild right basilar atelectasis. Elevation of the right diaphragm. Hepatobiliary: No focal hepatic mass. Dystrophic calcification in the right hepatic lobe adjacent to the middle portal vein. Normal gallbladder. No intrahepatic or extrahepatic biliary ductal dilatation. Pancreas: Unremarkable.  No pancreatic ductal dilatation or surrounding inflammatory changes. Spleen: Normal in size without focal abnormality. Adrenals/Urinary Tract: Adrenal glands are unremarkable. Kidneys are normal, without renal calculi, focal lesion, or hydronephrosis. Multiple bladder calculi are noted. Stomach/Bowel: Severe small bowel dilatation measuring up to 5 cm in diameter with multiple air-fluid levels with a relative transition zone where there appears to be tethering in the lower abdomen/pelvis likely secondary to adhesions. Distal small bowel is decompressed. Colon is decompressed. No pneumatosis, pneumoperitoneum or portal venous gas. Left lower quadrant colostomy. Vascular/Lymphatic: Abdominal aortic atherosclerosis. Normal caliber abdominal aorta. No lymphadenopathy. Reproductive: Prior prostatectomy. Other: Left inguinal fat containing hernia. 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 Presacral soft tissue prominence likely related to posttreatment changes. Musculoskeletal: No  acute osseous abnormality. No aggressive osseous lesion. Ankylosis of bilateral sacroiliac joints. Degenerative disc disease with disc height loss at L3-4, L4-5 and L5-S1 with bilateral facet arthropathy. IMPRESSION: 1. Severe small bowel obstruction with a relative transition zone in the lower abdomen/pelvis likely secondary to adhesions. Small bowel measures up to 5 cm in diameter. 2.  Aortic Atherosclerosis (ICD10-I70.0). 3. 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 4.  Left lower quadrant colostomy. 5. Multiple bladder calculi. 6. Small left pleural effusion. Electronically Signed   By: Kathreen Devoid   On: 08/28/2017 12:12    Procedures Procedures (including critical care time)  Medications Ordered in ED Medications  0.9 %  sodium chloride infusion ( Intravenous New Bag/Given 08/28/17 1035)  ondansetron (ZOFRAN) injection 4 mg (4 mg Intravenous Given 08/28/17 1035)  iopamidol (ISOVUE-300) 61 % injection 100 mL (100 mLs Intravenous Contrast Given 08/28/17 1107)     Initial Impression / Assessment and Plan / ED Course  I have reviewed the triage vital signs and the nursing notes.  Pertinent labs & imaging results that were available during my care of the patient were reviewed by me and considered in my medical decision making (see chart for details).    Labs and imaging reviewed. Pt with severe sbo.  Ng tube ordered. Discussed with Dr. Arnoldo Morale with surgery who will follow patient. Call to Hospitalists for admission. Pt accepted to hospitalist service. Requested ekg which has been ordered.   Final Clinical Impressions(s) / ED Diagnoses   Final diagnoses:  Small bowel obstruction ([place])  Leukocytosis, unspecified type    ED Discharge Orders    None       Landis Martins 08/28/17 1232    Evalee Jefferson, PA-C 08/28/17 1238    Daleen Bo, MD 08/28/17 [phone]

## 2017-08-28 NOTE — ED Provider Notes (Signed)
  Face-to-face evaluation   History: Presents for evaluation present for several days and worsening.  Physical exam: Alert elderly man who is confused.  Abdomen is mildly distended, and mildly tender left mid.  Medical screening examination/treatment/procedure(s) were conducted as a shared visit with non-physician practitioner(s) and myself.  I personally evaluated the patient during the encounter    Daleen Bo, MD 08/28/17 [phone]

## 2017-08-28 NOTE — ED Triage Notes (Signed)
Pt sent from the [facility] center for evaluation of vomiting with hx of SBO.  Pt states he has had very little output from colostomy x 3 days, but denies abd pain.

## 2017-08-29 ENCOUNTER — Inpatient Hospital Stay (HOSPITAL_COMMUNITY): Payer: Medicare Other

## 2017-08-29 DIAGNOSIS — I609 Nontraumatic subarachnoid hemorrhage, unspecified: Secondary | ICD-10-CM

## 2017-08-29 DIAGNOSIS — D72829 Elevated white blood cell count, unspecified: Secondary | ICD-10-CM

## 2017-08-29 DIAGNOSIS — N179 Acute kidney failure, unspecified: Secondary | ICD-10-CM

## 2017-08-29 DIAGNOSIS — K56609 Unspecified intestinal obstruction, unspecified as to partial versus complete obstruction: Secondary | ICD-10-CM

## 2017-08-29 LAB — URINALYSIS, COMPLETE (UACMP) WITH MICROSCOPIC
BILIRUBIN URINE: NEGATIVE
Bacteria, UA: NONE SEEN
GLUCOSE, UA: NEGATIVE mg/dL
KETONES UR: NEGATIVE mg/dL
LEUKOCYTES UA: NEGATIVE
Nitrite: NEGATIVE
Protein, ur: 30 mg/dL — AB
SQUAMOUS EPITHELIAL / LPF: NONE SEEN
Specific Gravity, Urine: 1.017 (ref 1.005–1.030)
pH: 5 (ref 5.0–8.0)

## 2017-08-29 LAB — CBC
HCT: 45.8 % (ref 39.0–52.0)
HEMOGLOBIN: 14.5 g/dL (ref 13.0–17.0)
MCH: 31.5 pg (ref 26.0–34.0)
MCHC: 31.7 g/dL (ref 30.0–36.0)
MCV: 99.6 fL (ref 78.0–100.0)
Platelets: 266 10*3/uL (ref 150–400)
RBC: 4.6 MIL/uL (ref 4.22–5.81)
RDW: 13.6 % (ref 11.5–15.5)
WBC: 19.3 10*3/uL — AB (ref 4.0–10.5)

## 2017-08-29 LAB — BASIC METABOLIC PANEL
Anion gap: 9 (ref 5–15)
BUN: 25 mg/dL — AB (ref 6–20)
CALCIUM: 8.8 mg/dL — AB (ref 8.9–10.3)
CO2: 24 mmol/L (ref 22–32)
Chloride: 111 mmol/L (ref 101–111)
Creatinine, Ser: 1.6 mg/dL — ABNORMAL HIGH (ref 0.61–1.24)
GFR calc Af Amer: 43 mL/min — ABNORMAL LOW (ref >60)
GFR calc non Af Amer: 37 mL/min — ABNORMAL LOW (ref >60)
Glucose, Bld: 98 mg/dL (ref 65–99)
POTASSIUM: 4.5 mmol/L (ref 3.5–5.1)
SODIUM: 144 mmol/L (ref 135–145)

## 2017-08-29 LAB — CREATININE, URINE, RANDOM: Creatinine, Urine: 51.52 mg/dL

## 2017-08-29 LAB — SODIUM, URINE, RANDOM: Sodium, Ur: 67 mmol/L

## 2017-08-29 MED ORDER — LORAZEPAM 2 MG/ML IJ SOLN
0.5000 mg | Freq: Once | INTRAMUSCULAR | Status: AC
  Administered 2017-08-29: 0.5 mg via INTRAVENOUS
  Filled 2017-08-29: qty 1

## 2017-08-29 MED ORDER — BOOST / RESOURCE BREEZE PO LIQD CUSTOM
1.0000 | Freq: Two times a day (BID) | ORAL | Status: DC
  Administered 2017-08-29: 1 via ORAL

## 2017-08-29 MED ORDER — METOPROLOL TARTRATE 5 MG/5ML IV SOLN
5.0000 mg | Freq: Four times a day (QID) | INTRAVENOUS | Status: DC
  Administered 2017-08-29 (×2): 5 mg via INTRAVENOUS
  Filled 2017-08-29 (×4): qty 5

## 2017-08-29 MED ORDER — PROCHLORPERAZINE EDISYLATE 5 MG/ML IJ SOLN
5.0000 mg | Freq: Once | INTRAMUSCULAR | Status: AC
  Administered 2017-08-29: 5 mg via INTRAVENOUS
  Filled 2017-08-29: qty 2

## 2017-08-29 MED ORDER — MAGNESIUM HYDROXIDE 400 MG/5ML PO SUSP: 30.0000 mL | Freq: Three times a day (TID) | ORAL | Status: DC | PRN

## 2017-08-29 MED ORDER — PRO-STAT SUGAR FREE PO LIQD: 30.0000 mL | Freq: Two times a day (BID) | ORAL | Status: DC

## 2017-08-29 MED ORDER — DILTIAZEM HCL-DEXTROSE 100-5 MG/100ML-% IV SOLN (PREMIX)
5.0000 mg/h | INTRAVENOUS | Status: DC
Start: 2017-08-29 — End: 2017-08-30
  Administered 2017-08-29: 5 mg/h via INTRAVENOUS
  Administered 2017-08-30 (×2): 10 mg/h via INTRAVENOUS
  Filled 2017-08-29 (×3): qty 100

## 2017-08-29 MED ORDER — TIMOLOL MALEATE 0.25 % OP SOLN
1.0000 [drp] | Freq: Two times a day (BID) | OPHTHALMIC | Status: DC
  Administered 2017-08-29 – 2017-09-01 (×6): 1 [drp] via OPHTHALMIC
  Filled 2017-08-29: qty 5

## 2017-08-29 MED ORDER — METOPROLOL TARTRATE 5 MG/5ML IV SOLN
2.5000 mg | Freq: Once | INTRAVENOUS | Status: AC
  Administered 2017-08-29: 2.5 mg via INTRAVENOUS
  Filled 2017-08-29: qty 5

## 2017-08-29 MED ORDER — METOPROLOL TARTRATE 5 MG/5ML IV SOLN
2.5000 mg | Freq: Four times a day (QID) | INTRAVENOUS | Status: DC
  Administered 2017-08-29: 2.5 mg via INTRAVENOUS
  Filled 2017-08-29: qty 5

## 2017-08-29 MED ORDER — POTASSIUM CHLORIDE IN NACL 20-0.9 MEQ/L-% IV SOLN
INTRAVENOUS | Status: DC
  Administered 2017-08-29 – 2017-08-31 (×5): via INTRAVENOUS

## 2017-08-29 NOTE — Consult Note (Signed)
Reason for Consult: Bowel obstruction Referring Physician: Dr. Dossie Der Bishop is an 84 y.o. male.  HPI: Patient is an 84 year old male who was hospitalized earlier this month with a small subarachnoid hemorrhage secondary to a fall and chronic anticoagulation with Coumadin.  He did not have a neurologic deficits.  He also has a significant history of colon cancer, status post colectomy with colostomy in 1998.  Patient was referred from the skilled nursing unit due to abdominal pain and fullness.  He was also experiencing some nausea and vomiting.  He was found on CT scan of the abdomen to have a partial small bowel obstruction.  Since his admission, there is been a small amount of stool in the ostomy bag.  Patient denies any significant abdominal pain.  Patient does have a known upper abdominal incisional hernia.  Patient is hungry and would like to be on a clear liquid diet.  Having 1 out of 10 pain.  Past Medical History:  Diagnosis Date  . At risk for falls   . Atrial fibrillation ([place])   . Chronic anticoagulation 2002  . Colon carcinoma ([place]) 1998   s/p partial colectomy and permanent colostomyin 1998  . Colostomy in place [facility])   . Glaucoma   . Glaucoma   . Hypertension   . Nontraumatic subarachnoid hemorrhage ([place])   . Overweight(278.02)   . Paroxysmal atrial fibrillation ([place]) 2002   Single episode by history during a noncardiac illness  . UTI (urinary tract infection)     Past Surgical History:  Procedure Laterality Date  . ABDOMINOPERINEAL PROCTOCOLECTOMY  1998   colon carcinoma; permanent colostomy  . CATARACT EXTRACTION     Bilateral  . COLONOSCOPY  11/2007   Dr. Juanda Crumble Bishop/[facility], via ostomy. unremarkable.    Family History  Problem Relation Age of Onset  . Cancer Mother        gallbladder  . Uterine cancer Maternal Aunt   . Pancreatic cancer Brother   . Liver cancer Sister   . Other Brother        amylodosis  . Kidney cancer  Sister     Social History:  reports that  has never smoked. he has never used smokeless tobacco. He reports that he does not drink alcohol or use drugs.  Allergies: No Known Allergies  Medications: I have reviewed the patient's current medications.  Results for orders placed or performed during the hospital encounter of 08/28/17 (from the past 48 hour(s))  CBC with Differential     Status: Abnormal   Collection Time: 08/28/17 10:09 AM  Result Value Ref Range   WBC 19.1 (H) 4.0 - 10.5 K/uL   RBC 4.64 4.22 - 5.81 MIL/uL   Hemoglobin 14.8 13.0 - 17.0 g/dL   HCT 44.9 39.0 - 52.0 %   MCV 96.8 78.0 - 100.0 fL   MCH 31.9 26.0 - 34.0 pg   MCHC 33.0 30.0 - 36.0 g/dL   RDW 13.1 11.5 - 15.5 %   Platelets 270 150 - 400 K/uL   Neutrophils Relative % 80 %   Neutro Abs 15.4 (H) 1.7 - 7.7 K/uL   Lymphocytes Relative 8 %   Lymphs Abs 1.5 0.7 - 4.0 K/uL   Monocytes Relative 11 %   Monocytes Absolute 2.1 (H) 0.1 - 1.0 K/uL   Eosinophils Relative 1 %   Eosinophils Absolute 0.1 0.0 - 0.7 K/uL   Basophils Relative 0 %   Basophils Absolute 0.0 0.0 - 0.1 K/uL  Comprehensive metabolic panel     Status: Abnormal   Collection Time: 08/28/17 10:09 AM  Result Value Ref Range   Sodium 138 135 - 145 mmol/L   Potassium 3.3 (L) 3.5 - 5.1 mmol/L   Chloride 101 101 - 111 mmol/L   CO2 26 22 - 32 mmol/L   Glucose, Bld 110 (H) 65 - 99 mg/dL   BUN 20 6 - 20 mg/dL   Creatinine, Ser 1.21 0.61 - 1.24 mg/dL   Calcium 9.3 8.9 - 10.3 mg/dL   Total Protein 8.3 (H) 6.5 - 8.1 g/dL   Albumin 3.2 (L) 3.5 - 5.0 g/dL   AST 57 (H) 15 - 41 U/L   ALT 52 17 - 63 U/L   Alkaline Phosphatase 116 38 - 126 U/L   Total Bilirubin 1.2 0.3 - 1.2 mg/dL   GFR calc non Af Amer 52 (L) >60 mL/min   GFR calc Af Amer >60 >60 mL/min    Comment: (NOTE) The eGFR has been calculated using the CKD EPI equation. This calculation has not been validated in all clinical situations. eGFR's persistently <60 mL/min signify possible Chronic  Kidney Disease.    Anion gap 11 5 - 15  Lipase, blood     Status: None   Collection Time: 08/28/17 10:09 AM  Result Value Ref Range   Lipase 22 11 - 51 U/L  Magnesium     Status: None   Collection Time: 08/28/17 10:14 AM  Result Value Ref Range   Magnesium 2.4 1.7 - 2.4 mg/dL  MRSA PCR Screening     Status: Abnormal   Collection Time: 08/28/17  4:55 PM  Result Value Ref Range   MRSA by PCR POSITIVE (A) NEGATIVE    Comment: RESULT CALLED TO, READ BACK BY AND VERIFIED WITH: Joel M. @ 3500 ON [id number]        The GeneXpert MRSA Assay (FDA approved for NASAL specimens only), is one component of a comprehensive MRSA colonization surveillance program. It is not intended to diagnose MRSA infection nor to guide or monitor treatment for MRSA infections.   Basic metabolic panel     Status: Abnormal   Collection Time: 08/29/17  4:44 AM  Result Value Ref Range   Sodium 144 135 - 145 mmol/L   Potassium 4.5 3.5 - 5.1 mmol/L    Comment: DELTA CHECK NOTED   Chloride 111 101 - 111 mmol/L   CO2 24 22 - 32 mmol/L   Glucose, Bld 98 65 - 99 mg/dL   BUN 25 (H) 6 - 20 mg/dL   Creatinine, Ser 1.60 (H) 0.61 - 1.24 mg/dL   Calcium 8.8 (L) 8.9 - 10.3 mg/dL   GFR calc non Af Amer 37 (L) >60 mL/min   GFR calc Af Amer 43 (L) >60 mL/min    Comment: (NOTE) The eGFR has been calculated using the CKD EPI equation. This calculation has not been validated in all clinical situations. eGFR's persistently <60 mL/min signify possible Chronic Kidney Disease.    Anion gap 9 5 - 15  CBC     Status: Abnormal   Collection Time: 08/29/17  4:44 AM  Result Value Ref Range   WBC 19.3 (H) 4.0 - 10.5 K/uL   RBC 4.60 4.22 - 5.81 MIL/uL   Hemoglobin 14.5 13.0 - 17.0 g/dL   HCT 45.8 39.0 - 52.0 %   MCV 99.6 78.0 - 100.0 fL   MCH 31.5 26.0 - 34.0 pg   MCHC 31.7 30.0 - 36.0 g/dL  RDW 13.6 11.5 - 15.5 %   Platelets 266 150 - 400 K/uL  Urinalysis, Complete w Microscopic     Status: Abnormal   Collection  Time: 08/29/17  7:26 AM  Result Value Ref Range   Color, Urine YELLOW YELLOW   APPearance CLEAR CLEAR   Specific Gravity, Urine 1.017 1.005 - 1.030   pH 5.0 5.0 - 8.0   Glucose, UA NEGATIVE NEGATIVE mg/dL   Hgb urine dipstick MODERATE (A) NEGATIVE   Bilirubin Urine NEGATIVE NEGATIVE   Ketones, ur NEGATIVE NEGATIVE mg/dL   Protein, ur 30 (A) NEGATIVE mg/dL   Nitrite NEGATIVE NEGATIVE   Leukocytes, UA NEGATIVE NEGATIVE   RBC / HPF 0-5 0 - 5 RBC/hpf   WBC, UA 0-5 0 - 5 WBC/hpf   Bacteria, UA NONE SEEN NONE SEEN   Squamous Epithelial / LPF NONE SEEN NONE SEEN   Mucus PRESENT   Sodium, urine, random     Status: None   Collection Time: 08/29/17  7:26 AM  Result Value Ref Range   Sodium, Ur 67 mmol/L  Creatinine, urine, random     Status: None   Collection Time: 08/29/17  7:26 AM  Result Value Ref Range   Creatinine, Urine 51.52 mg/dL    US Abdomen Complete  Result Date: 08/29/2017 CLINICAL DATA:  Elevated liver function tests. Acute renal insufficiency. EXAM: ABDOMEN ULTRASOUND COMPLETE COMPARISON:  Abdominal CT 08/28/2017 FINDINGS: Gallbladder: The gallbladder wall is normally distended. There is a calculus measuring 1.2 cm in the neck of the gallbladder without appreciable gallbladder wall thickening. The gallbladder wall measures 1.8 mm. No pericholecystic fluid. Common bile duct: Diameter: 6.2 mm Liver: No focal lesion identified. Coarse parenchymal echogenicity. Portal vein is patent on color Doppler imaging with normal direction of blood flow towards the liver. IVC: Limited visualization without focal abnormality. Pancreas: Not seen. Spleen: Size and appearance within normal limits. Right Kidney: Length: 10.1 cm. Echogenicity within normal limits. Moderate hydronephrosis visualized. Left Kidney: Length: 11.8 cm. Echogenicity within normal limits. Moderate hydronephrosis visualized. Abdominal aorta: Mid to distal aorta is not seen. No aneurysmal dilation of the proximal aorta. Other  findings: None. IMPRESSION: Limited visualization due to ongoing small bowel obstruction. Cholelithiasis without evidence of acute cholecystitis. Bilateral moderate hydronephrosis. Electronically Signed   By: Fidela Salisbury BishopD.   On: 08/29/2017 10:33   Ct Abdomen Pelvis W Contrast  Result Date: 08/28/2017 CLINICAL DATA:  Decreased urine output. Small bowel obstruction. History of prostatectomy and colostomy in 1998 EXAM: CT ABDOMEN AND PELVIS WITH CONTRAST TECHNIQUE: Multidetector CT imaging of the abdomen and pelvis was performed using the standard protocol following bolus administration of intravenous contrast. CONTRAST:  155m ISOVUE-300 IOPAMIDOL (ISOVUE-300) INJECTION 61% COMPARISON:  None. FINDINGS: Lower chest: Small left pleural effusion. Left basilar atelectasis. Mild right basilar atelectasis. Elevation of the right diaphragm. Hepatobiliary: No focal hepatic mass. Dystrophic calcification in the right hepatic lobe adjacent to the middle portal vein. Normal gallbladder. No intrahepatic or extrahepatic biliary ductal dilatation. Pancreas: Unremarkable. No pancreatic ductal dilatation or surrounding inflammatory changes. Spleen: Normal in size without focal abnormality. Adrenals/Urinary Tract: Adrenal glands are unremarkable. Kidneys are normal, without renal calculi, focal lesion, or hydronephrosis. Multiple bladder calculi are noted. Stomach/Bowel: Severe small bowel dilatation measuring up to 5 cm in diameter with multiple air-fluid levels with a relative transition zone where there appears to be tethering in the lower abdomen/pelvis likely secondary to adhesions. Distal small bowel is decompressed. Colon is decompressed. No pneumatosis, pneumoperitoneum or portal venous gas.  Left lower quadrant colostomy. Vascular/Lymphatic: Abdominal aortic atherosclerosis. Normal caliber abdominal aorta. No lymphadenopathy. Reproductive: Prior prostatectomy. Other: Left inguinal fat containing hernia. Ventral  wide-mouth abdominal hernia containing transverse colon without obstruction. Presacral soft tissue prominence likely related to posttreatment changes. Musculoskeletal: No acute osseous abnormality. No aggressive osseous lesion. Ankylosis of bilateral sacroiliac joints. Degenerative disc disease with disc height loss at L3-4, L4-5 and L5-S1 with bilateral facet arthropathy. IMPRESSION: 1. Severe small bowel obstruction with a relative transition zone in the lower abdomen/pelvis likely secondary to adhesions. Small bowel measures up to 5 cm in diameter. 2.  Aortic Atherosclerosis (ICD10-I70.0). 3. 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 4.  Left lower quadrant colostomy. 5. Multiple bladder calculi. 6. Small left pleural effusion. Electronically Signed   By: Kathreen Devoid   On: 08/28/2017 12:12   Dg Abd Portable 2v  Result Date: 08/29/2017 CLINICAL DATA:  Shortness of breath, history of colon cancer EXAM: PORTABLE ABDOMEN - 2 VIEW COMPARISON:  CT abdomen 08/28/2017 FINDINGS: There is persistent severe dilatation of the small bowel. There is no evidence of pneumoperitoneum, portal venous gas or pneumatosis. There are no pathologic calcifications along the expected course of the ureters. There is contrast in the bladder from recent IV contrast injection. There is stable cardiomegaly. There elevation of the right diaphragm. The osseous structures are unremarkable. IMPRESSION: Persistent small-bowel obstruction. Electronically Signed   By: Kathreen Devoid   On: 08/29/2017 09:47    ROS:  Pertinent items are noted in HPI.  Blood pressure (!) 130/101, pulse (!) 131, temperature 98.4 F (36.9 C), temperature source Oral, resp. rate 18, height 5' 6.5" (1.689 m), weight 206 lb 4.8 oz (93.6 kg), SpO2 93 %. Physical Exam: Pleasant male in no acute distress Head is normocephalic, atraumatic Lungs clear to auscultation with equal breath sounds bilaterally Heart examination  reveals regular rate and rhythm without S3, S4, murmurs Abdomen is soft with a protuberant large epigastric incisional hernia.  A colostomy is present along the left side of the abdomen with some stool present.  No rigidity is noted.  Occasional bowel sounds appreciated.  CT scan images personally reviewed  Assessment/Plan: Impression: Partial small bowel obstruction secondary to adhesive disease.  No need for acute surgical intervention at this time. Plan: We will start clear liquid diet and cathartics.  Joel Bishop 08/29/2017, 1:12 PM

## 2017-08-29 NOTE — Progress Notes (Signed)
Patient having trouble breathing saturation are low still. He has large hernia extended stomach breathing 40 times place on NRB mask , after 10 lpm high flow. He is still trying to throw up.Marland Kitchen Appears to be having trouble from diaphram.

## 2017-08-29 NOTE — Progress Notes (Signed)
Night shift ICU coverage note.  The patient was seen several times through the night due to emesis, tachypnea with hypoxia and tachycardia.  Apparently he was having dinner when he developed nausea and emesis.  He stated he threw up twice.  After this happened, the patient began having respiratory distress with hypoxia, despite increased oxygen administration.  He also became more tachycardic in the 130s and 140s despite being on the Cardizem continuous infusion.  His abdomen was more distended.  Vital signs at that moment or pulse 130, respiratory rate 35, blood pressure 109/59 mmHg and O2 sat 83% on high flow nasal cannula oxygen.  General: Looked anxious, but oriented x4. HEENT: No icterus, oral mucosa was moist. Lungs: Decreased breath sounds on bases with bilateral rhonchi left > right. Cardiovascular: S1-S2, irregularly irregular with RVR, no edema. Abdomen: Distended.  Positive ventral hernia.  Bowel sounds were absent. Positive epigastric tenderness on palpation without guarding or rebound.   Assessment:  Acute respiratory failure Aspiration pneumonia. Small bowel obstruction.  An NG tube was placed which drained about [phone] mL of bilious fluid. Bronchodilators were given. Although the patient expressed relief from his abdominal distention, He continue being tachypneic and tachycardic. He was given antiemetic, morphine and 2.5 mg of metoprolol.  eLink was contacted who recommended ET intubation if no clinical improvement. The patient subsequently was intubated for air way protection to avoid respiratory muscle fatigue and further deterioration. No paralytics were used during ET intubation to avoid hypotension. No sedative infusion was ordered and the patient has remained calm with PRN lorazepam. Subsequently Cardizem infusion was held due to hypotension. Unasyn was started.  PCCM consult requested.  Tennis Must, MD  Over 120 minutes of critical care time were used during this  event.  This document was prepared using Dragon voice recognition software may contain some unintended errors.

## 2017-08-29 NOTE — Progress Notes (Signed)
Noted diet advanced to CL. Will order appropriate supplements to help increase Por/kcal as much as possible while on restricted diet.   Burtis Junes RD, LDN, CNSC Clinical Nutrition Pager: [phone] 08/29/2017 3:46 PM

## 2017-08-29 NOTE — Progress Notes (Signed)
Pt transferring to ICU to receive cardizem drip. Report called to [place], Therapist, sports.

## 2017-08-29 NOTE — Progress Notes (Signed)
Initial Nutrition Assessment  DOCUMENTATION CODES:  Obesity unspecified  INTERVENTION:  Patient NPO due to SBO.   Will monitor for diet advancement.   Given patient's poor intake/weight loss prior over the past week, would recommend TPN if patient anticipated to be NPO for >/=3days.   NUTRITION DIAGNOSIS:  Inadequate oral intake related to inability to eat, altered GI function(SBO) as evidenced by NPO status and loss of 3.5% bw x 1 week  GOAL:  Patient will meet greater than or equal to 90% of their needs  MONITOR:  Diet advancement, PO intake, Weight trends, Labs, I & O's  REASON FOR ASSESSMENT:  Malnutrition Screening Tool    ASSESSMENT:  84 y/o male PMHx Colon carcinoma s/p colectomy w/ colostomy, afib, HTN. Recently hospitalized 1/9-1/12 after falling at home and suffered small SAH. Also found to have UTI. He discharged briefly to SNF. Returned to hospital w/ 1 week nausea and progressively worseining abdominal fullness/pain as well as low colostomy output. Found to have AKI and Imaging revealed SBO and pt admitted for management.   Pt had been a resident at [facility]. Per SNF documentation, pt had been eating poorly (<50%) since he was admitted on 1/12. He was on a "No added salt" diet. He was not receiving any vitamins, minerals or nutritional supplements.   His weight on initial SNF admission was ~214.5 lbs. His current weight indicates a loss of about 8 lbs in 1 week, which is clinically significant.   Based on poor intake for >5 days and significant weight loss, would meet criteria for severe malnutrition. Low threshold for initiation of TPN.   Labs: BUN/Creat:25/1.6 (increased) Meds: PPI, IVF  Recent Labs  Lab 08/25/17 0400 08/28/17 1009 08/28/17 1014 08/29/17 0444  NA 136 138  --  144  K 4.1 3.3*  --  4.5  CL 99* 101  --  111  CO2 23 26  --  24  BUN 16 20  --  25*  CREATININE 0.79 1.21  --  1.60*  CALCIUM 8.9 9.3  --  8.8*  MG  --   --  2.4  --    GLUCOSE 115* 110*  --  98   NUTRITION - FOCUSED PHYSICAL EXAM: Unable to assess  Diet Order:  Diet NPO time specified  EDUCATION NEEDS:  No education needs have been identified at this time  Skin: MSAD to abdomen,   Last BM:  Unknown-Colostomy  Height:  Ht Readings from Last 1 Encounters:  08/28/17 5' 6.5" (1.689 m)   Weight:  Wt Readings from Last 1 Encounters:  08/28/17 206 lb 4.8 oz (93.6 kg)   Wt Readings from Last 10 Encounters:  08/28/17 206 lb 4.8 oz (93.6 kg)  08/24/17 222 lb (100.7 kg)  08/20/17 222 lb (100.7 kg)  09/06/16 222 lb (100.7 kg)  08/24/15 222 lb (100.7 kg)  08/31/14 225 lb (102.1 kg)  04/01/13 221 lb 9.6 oz (100.5 kg)  01/13/13 226 lb (102.5 kg)  01/13/12 220 lb (99.8 kg)  07/08/11 207 lb (93.9 kg)   Ideal Body Weight:  65.91 kg  BMI:  Body mass index is 32.8 kg/m.  Estimated Nutritional Needs:  Kcal:  1700-1900 kcals (18-20kcal/kg bw) Protein:  80-92 (1.2-1.4 g/kg ibw) Fluid:  1.7-1.9 L fluid (1 ml/kcal)  Burtis Junes RD, LDN, CNSC Clinical Nutrition Pager: [phone] 08/29/2017 9:42 AM

## 2017-08-29 NOTE — Progress Notes (Signed)
PROGRESS NOTE  Joel Bishop YQM:[id number] DOB: 10-10-1927 DOA: 08/28/2017 PCP: Rosita Fire, MD  Brief History:  84 year old male with a history of colon cancer status post colostomy 1998, paroxysmal atrial fibrillation, hypertension, and recent subarachnoid hemorrhage presenting with nearly 1 week history of intermittent abdominal fullness.  The patient was recently admitted to the hospital from 08/20/17 through 08/23/17 secondary to a subarachnoid hemorrhage resulting from a fall at home.  As result, the patient was taken off of his warfarin after INR reversal.  The patient also was treated for UTI with ciprofloxacin which she finished on 08/26/2017.  He presented to the emergency department 08/24/2017 with nausea, vomiting, and abdominal fullness. Abdominal x-ray Suggests inability to rule out underlying bowel obstruction but since Patient's symptoms had resolved in the ED aith normal abdominal exam and Patient tolerating diet without any problem he was discharged back to SNF. He was then seen by physician at the facility next day and again at that time he did not have any complaint and was tolerating diet without a problem.  Since then, the patient has had intermittent abdominal fullness and nausea.  On the morning of 08/28/2017, the patient had worsening abdominal pain associated with nausea and vomiting.  He also reported low colostomy output times 2 days.  CT of the abdomen and pelvis in the emergency department showed small bowel dilatation up to 5 cm with air-fluid levels with a transition zone in the lower abdomen and pelvis.    Assessment/Plan: Small bowel obstruction -Continue IV fluids -Remain n.p.o. -General surgery consult -Continue bowel rest  Atrial fibrillation with RVR -Start IV metoprolol as the patient remains npo -Placed on telemetry -Warfarin discontinued recently secondary to subarachnoid hemorrhage  AKI -Likely secondary to volume depletion -Continue IV  fluids -Abdominal ultrasound  Leukocytosis -likely due to stress demargination -check UA and urine culture -am CBC  Essential hypertension -IV metoprolol as discussed above  Recent subarachnoid hemorrhage -Anticoagulation discontinued indefinitely  Hypokalemia -repleted -am BMP    Disposition Plan:   SNF in 2-3 days  Family Communication:  No Family at bedside  Consultants:  General surgery  Code Status:  FULL   DVT Prophylaxis:  SCDs   Procedures: As Listed in Progress Note Above  Antibiotics: None    Subjective:   Objective: Vitals:   08/28/17 1045 08/28/17 1534 08/28/17 2255 08/29/17 0541  BP: (!) 150/88 (!) 154/91 117/71 (!) 145/92  Pulse: 76 (!) 117 (!) 121 (!) 128  Resp: 18 18 18 18   Temp:  (!) 97.5 F (36.4 C) 98.4 F (36.9 C) 98.4 F (36.9 C)  TempSrc:  Oral Oral Oral  SpO2: 100% 95% 90% 93%  Weight:  93.6 kg (206 lb 4.8 oz)    Height:  5' 6.5" (1.689 m)      Intake/Output Summary (Last 24 hours) at 08/29/2017 0715 Last data filed at 08/29/2017 0541 Gross per 24 hour  Intake 2177.92 ml  Output 275 ml  Net 1902.92 ml   Weight change:  Exam:   General:  Pt is alert, follows commands appropriately, not in acute distress  HEENT: No icterus, No thrush, No neck mass, [place]/AT  Cardiovascular: RRR, S1/S2, no rubs, no gallops  Respiratory: CTA bilaterally, no wheezing, no crackles, no rhonchi  Abdomen: Soft/+BS, non tender, non distended, no guarding  Extremities: No edema, No lymphangitis, No petechiae, No rashes, no synovitis   Data Reviewed: I have personally reviewed following labs and imaging studies  Basic Metabolic Panel: Recent Labs  Lab 08/24/17 1514 08/25/17 0400 08/28/17 1009 08/28/17 1014 08/29/17 0444  NA 139 136 138  --  144  K 4.3 4.1 3.3*  --  4.5  CL 101 99* 101  --  111  CO2  --  23 26  --  24  GLUCOSE 102* 115* 110*  --  98  BUN 15 16 20   --  25*  CREATININE 0.80 0.79 1.21  --  1.60*  CALCIUM  --  8.9 9.3   --  8.8*  MG  --   --   --  2.4  --    Liver Function Tests: Recent Labs  Lab 08/28/17 1009  AST 57*  ALT 52  ALKPHOS 116  BILITOT 1.2  PROT 8.3*  ALBUMIN 3.2*   Recent Labs  Lab 08/28/17 1009  LIPASE 22   No results for input(s): AMMONIA in the last 168 hours. Coagulation Profile: No results for input(s): INR, PROTIME in the last 168 hours. CBC: Recent Labs  Lab 08/24/17 1514 08/25/17 0400 08/28/17 1009 08/29/17 0444  WBC  --  13.0* 19.1* 19.3*  NEUTROABS  --  10.9* 15.4*  --   HGB 15.3 16.3 14.8 14.5  HCT 45.0 47.7 44.9 45.8  MCV  --  96.2 96.8 99.6  PLT  --  276 270 266   Cardiac Enzymes: No results for input(s): CKTOTAL, CKMB, CKMBINDEX, TROPONINI in the last 168 hours. BNP: Invalid input(s): POCBNP CBG: No results for input(s): GLUCAP in the last 168 hours. HbA1C: No results for input(s): HGBA1C in the last 72 hours. Urine analysis:    Component Value Date/Time   COLORURINE YELLOW 08/20/2017 1910   APPEARANCEUR CLOUDY (A) 08/20/2017 1910   LABSPEC 1.015 08/20/2017 1910   PHURINE 7.0 08/20/2017 1910   GLUCOSEU NEGATIVE 08/20/2017 1910   HGBUR SMALL (A) 08/20/2017 1910   BILIRUBINUR NEGATIVE 08/20/2017 1910   KETONESUR NEGATIVE 08/20/2017 1910   PROTEINUR 30 (A) 08/20/2017 1910   NITRITE POSITIVE (A) 08/20/2017 1910   LEUKOCYTESUR LARGE (A) 08/20/2017 1910   Sepsis Labs: @LABRCNTIP (procalcitonin:4,lacticidven:4) ) Recent Results (from the past 240 hour(s))  MRSA PCR Screening     Status: Abnormal   Collection Time: 08/28/17  4:55 PM  Result Value Ref Range Status   MRSA by PCR POSITIVE (A) NEGATIVE Final    Comment: RESULT CALLED TO, READ BACK BY AND VERIFIED WITH: HOWERTON M. @ 1830 ON [id number]        The GeneXpert MRSA Assay (FDA approved for NASAL specimens only), is one component of a comprehensive MRSA colonization surveillance program. It is not intended to diagnose MRSA infection nor to guide or monitor treatment for MRSA  infections.      Scheduled Meds: . chlorhexidine  15 mL Mouth Rinse BID  . Chlorhexidine Gluconate Cloth  6 each Topical Q0600  . latanoprost  1 drop Both Eyes QHS  . mouth rinse  15 mL Mouth Rinse q12n4p  . mupirocin ointment  1 application Nasal BID  . pantoprazole (PROTONIX) IV  40 mg Intravenous Q24H  . sodium chloride flush  3 mL Intravenous Q12H  . timolol  1 drop Left Eye BID   Continuous Infusions: . sodium chloride Stopped (08/28/17 1602)  . 0.9 % NaCl with KCl 40 mEq / L 100 mL/hr (08/29/17 0300)    Procedures/Studies: Dg Chest 1 View  Result Date: 08/20/2017 CLINICAL DATA:  Generalized weakness EXAM: CHEST 1 VIEW COMPARISON:  None. FINDINGS: Cardiomegaly and vascular  pedicle widening. Congested appearance of vessels without Kerley lines or effusion. No pneumothorax or air bronchogram. Artifact from EKG leads. IMPRESSION: Cardiomegaly and vascular congestion accentuated by low volumes. Electronically Signed   By: Monte Fantasia M.D.   On: 08/20/2017 20:04   Ct Head Wo Contrast  Result Date: 08/21/2017 CLINICAL DATA:  [place] yesterday, subarachnoid hemorrhage, followup EXAM: CT HEAD WITHOUT CONTRAST TECHNIQUE: Contiguous axial images were obtained from the base of the skull through the vertex without intravenous contrast. Sagittal and coronal MPR images reconstructed from axial data set. COMPARISON:  08/20/2017 FINDINGS: Brain: Generalized atrophy. Prominent ventricular system unchanged. No midline shift or mass effect. Small vessel chronic ischemic changes of deep cerebral white matter. Again identified small amount of high attenuation acute subarachnoid hemorrhage at the interhemispheric fissure, unchanged. No new areas of intracranial hemorrhage, mass lesion, or acute infarction. No new extra-axial collections. Vascular: Atherosclerotic calcifications of internal carotid arteries at skull base Skull: Intact though demineralized Sinuses/Orbits: Clear Other: N/A IMPRESSION: Stable  appearance of a small amount of acute subarachnoid hemorrhage at the interhemispheric fissure anteriorly. Atrophy with small vessel chronic ischemic changes of deep cerebral white matter. No new intracranial abnormalities. Electronically Signed   By: Lavonia Dana M.D.   On: 08/21/2017 16:20   Ct Head Wo Contrast  Result Date: 08/20/2017 CLINICAL DATA:  84 year old male with history of confusion today. Swelling in both legs. EXAM: CT HEAD WITHOUT CONTRAST TECHNIQUE: Contiguous axial images were obtained from the base of the skull through the vertex without intravenous contrast. COMPARISON:  No priors. FINDINGS: Brain: Small amount of high attenuation in the anterior aspect of the interhemispheric fissure best appreciated on axial images 20 and 21 and coronal images 25 through 29. Patchy and confluent areas of decreased attenuation are noted throughout the deep and periventricular white matter of the cerebral hemispheres bilaterally, compatible with chronic microvascular ischemic disease. Moderate to severe cerebral atrophy with associated ex vacuo dilatation of the ventricular system. No evidence of acute infarction, mass or mass effect. Vascular: No hyperdense vessel or unexpected calcification. Skull: Normal. Negative for fracture or focal lesion. Sinuses/Orbits: No acute finding. Other: None. IMPRESSION: 1. Small amount of subarachnoid hemorrhage noted in the anterior interhemispheric fissure. 2. Moderate to severe cerebral atrophy with probable ex vacuo dilatation of the ventricular system. The possibility of normal pressure hydrocephalus (NPH) is not entirely excluded. 3. Extensive chronic microvascular ischemic changes throughout the cerebral white matter, as above. Critical Value/emergent results were called by telephone at the time of interpretation on 08/20/2017 at 8:23 pm to Dr. Nat Christen, who verbally acknowledged these results. Electronically Signed   By: Vinnie Langton M.D.   On: 08/20/2017 20:23    Ct Abdomen Pelvis W Contrast  Result Date: 08/28/2017 CLINICAL DATA:  Decreased urine output. Small bowel obstruction. History of prostatectomy and colostomy in 1998 EXAM: CT ABDOMEN AND PELVIS WITH CONTRAST TECHNIQUE: Multidetector CT imaging of the abdomen and pelvis was performed using the standard protocol following bolus administration of intravenous contrast. CONTRAST:  11mL ISOVUE-300 IOPAMIDOL (ISOVUE-300) INJECTION 61% COMPARISON:  None. FINDINGS: Lower chest: Small left pleural effusion. Left basilar atelectasis. Mild right basilar atelectasis. Elevation of the right diaphragm. Hepatobiliary: No focal hepatic mass. Dystrophic calcification in the right hepatic lobe adjacent to the middle portal vein. Normal gallbladder. No intrahepatic or extrahepatic biliary ductal dilatation. Pancreas: Unremarkable. No pancreatic ductal dilatation or surrounding inflammatory changes. Spleen: Normal in size without focal abnormality. Adrenals/Urinary Tract: Adrenal glands are unremarkable. Kidneys are normal, without renal  calculi, focal lesion, or hydronephrosis. Multiple bladder calculi are noted. Stomach/Bowel: Severe small bowel dilatation measuring up to 5 cm in diameter with multiple air-fluid levels with a relative transition zone where there appears to be tethering in the lower abdomen/pelvis likely secondary to adhesions. Distal small bowel is decompressed. Colon is decompressed. No pneumatosis, pneumoperitoneum or portal venous gas. Left lower quadrant colostomy. Vascular/Lymphatic: Abdominal aortic atherosclerosis. Normal caliber abdominal aorta. No lymphadenopathy. Reproductive: Prior prostatectomy. Other: Left inguinal fat containing hernia. 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 Presacral soft tissue prominence likely related to posttreatment changes. Musculoskeletal: No acute osseous abnormality. No aggressive osseous lesion. Ankylosis of bilateral  sacroiliac joints. Degenerative disc disease with disc height loss at L3-4, L4-5 and L5-S1 with bilateral facet arthropathy. IMPRESSION: 1. Severe small bowel obstruction with a relative transition zone in the lower abdomen/pelvis likely secondary to adhesions. Small bowel measures up to 5 cm in diameter. 2.  Aortic Atherosclerosis (ICD10-I70.0). 3. 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 4.  Left lower quadrant colostomy. 5. Multiple bladder calculi. 6. Small left pleural effusion. Electronically Signed   By: Kathreen Devoid   On: 08/28/2017 12:12   Dg Abd Acute W/chest  Result Date: 08/24/2017 CLINICAL DATA:  Nausea, vomiting, abdominal pain EXAM: DG ABDOMEN ACUTE W/ 1V CHEST COMPARISON:  08/20/2017 FINDINGS: Cardiomegaly.  Bibasilar atelectasis.  No effusions. Mildly prominent small bowel loops in the mid abdomen. Gas is noted within decompressed colon. Cannot exclude partial small bowel obstruction. No free air organomegaly. IMPRESSION: Dilated small bowel loops in the mid abdomen. Cannot exclude partial small bowel obstruction. Cardiomegaly, bibasilar atelectasis. Electronically Signed   By: Rolm Baptise M.D.   On: 08/24/2017 14:53    Orson Eva, DO  [facility] Pager [phone]  If 7PM-7AM, please contact night-coverage [email or web address] Password [email or web address] 08/29/2017, 7:15 AM   LOS: 1 day

## 2017-08-29 NOTE — Progress Notes (Signed)
Patient still breathing very fast in 40's saturation 90''s on NRB mask 15 lpm.

## 2017-08-29 NOTE — Progress Notes (Signed)
Pt O2 saturations began to decrease after pt vomited a significant amount of green substance. Chest X ray was taken and Respiratory called. Initially placed pt on 3LNC with no positive outcome. O2 sats remained 83-87. Placed pt on 10L HFNC sats increased to 88-89. Pt stated he could not take deep breaths due to pressure in abdomen that began after he had a cup of water to drink. MD paged. Respirations increased 35-40, O2 88. NG tube #16 placed, immediate return of 600cc of bile colored substance. Pt stated pressure is relieved. Still tachypneic .Pt is now on NRB. Pt confirmed with MD and this RN that he wishes to be full code.   Cardizem gtt increased to 15 at 1930. HR remains 110's-120's. BP stable. Possible intubation in question

## 2017-08-30 ENCOUNTER — Inpatient Hospital Stay (HOSPITAL_COMMUNITY): Payer: Medicare Other

## 2017-08-30 ENCOUNTER — Inpatient Hospital Stay (HOSPITAL_COMMUNITY): Payer: Medicare Other | Admitting: Anesthesiology

## 2017-08-30 DIAGNOSIS — N1330 Unspecified hydronephrosis: Secondary | ICD-10-CM | POA: Diagnosis not present

## 2017-08-30 DIAGNOSIS — J690 Pneumonitis due to inhalation of food and vomit: Secondary | ICD-10-CM

## 2017-08-30 DIAGNOSIS — J9601 Acute respiratory failure with hypoxia: Secondary | ICD-10-CM

## 2017-08-30 DIAGNOSIS — A419 Sepsis, unspecified organism: Secondary | ICD-10-CM

## 2017-08-30 DIAGNOSIS — Z978 Presence of other specified devices: Secondary | ICD-10-CM

## 2017-08-30 LAB — CBC
HEMATOCRIT: 48.5 % (ref 39.0–52.0)
Hemoglobin: 15.5 g/dL (ref 13.0–17.0)
MCH: 32.7 pg (ref 26.0–34.0)
MCHC: 32 g/dL (ref 30.0–36.0)
MCV: 102.3 fL — ABNORMAL HIGH (ref 78.0–100.0)
Platelets: 193 10*3/uL (ref 150–400)
RBC: 4.74 MIL/uL (ref 4.22–5.81)
RDW: 13.9 % (ref 11.5–15.5)
WBC: 30.9 10*3/uL — AB (ref 4.0–10.5)

## 2017-08-30 LAB — BLOOD GAS, ARTERIAL
ACID-BASE DEFICIT: 7.6 mmol/L — AB (ref 0.0–2.0)
Acid-base deficit: 9 mmol/L — ABNORMAL HIGH (ref 0.0–2.0)
BICARBONATE: 17.9 mmol/L — AB (ref 20.0–28.0)
Bicarbonate: 17.3 mmol/L — ABNORMAL LOW (ref 20.0–28.0)
DRAWN BY: 221791
Drawn by: 105551
FIO2: 100
FIO2: 100
LHR: 20 {breaths}/min
MECHVT: 500 mL
MECHVT: 500 mL
Mechanical Rate: 20
O2 Saturation: 90.4 %
O2 Saturation: 95.2 %
PEEP/CPAP: 5 cmH2O
PEEP: 5 cmH2O
PH ART: 7.269 — AB (ref 7.350–7.450)
RATE: 20 resp/min
pCO2 arterial: 40.8 mmHg (ref 32.0–48.0)
pCO2 arterial: 34.7 mmHg (ref 32.0–48.0)
pH, Arterial: 7.292 — ABNORMAL LOW (ref 7.350–7.450)
pO2, Arterial: 71.6 mmHg — ABNORMAL LOW (ref 83.0–108.0)
pO2, Arterial: 56.2 mmHg — ABNORMAL LOW (ref 83.0–108.0)

## 2017-08-30 LAB — URINE CULTURE

## 2017-08-30 LAB — BASIC METABOLIC PANEL
Anion gap: 13 (ref 5–15)
BUN: 37 mg/dL — ABNORMAL HIGH (ref 6–20)
CALCIUM: 8.5 mg/dL — AB (ref 8.9–10.3)
CO2: 20 mmol/L — ABNORMAL LOW (ref 22–32)
Chloride: 115 mmol/L — ABNORMAL HIGH (ref 101–111)
Creatinine, Ser: 2.23 mg/dL — ABNORMAL HIGH (ref 0.61–1.24)
GFR calc Af Amer: 29 mL/min — ABNORMAL LOW (ref >60)
GFR, EST NON AFRICAN AMERICAN: 25 mL/min — AB (ref >60)
GLUCOSE: 85 mg/dL (ref 65–99)
POTASSIUM: 4.5 mmol/L (ref 3.5–5.1)
SODIUM: 148 mmol/L — AB (ref 135–145)

## 2017-08-30 LAB — LACTIC ACID, PLASMA
LACTIC ACID, VENOUS: 2.5 mmol/L — AB (ref 0.5–1.9)
LACTIC ACID, VENOUS: 2.1 mmol/L — AB (ref 0.5–1.9)
Lactic Acid, Venous: 3.8 mmol/L (ref 0.5–1.9)

## 2017-08-30 LAB — PROCALCITONIN: Procalcitonin: 62.1 ng/mL

## 2017-08-30 MED ORDER — SODIUM CHLORIDE 0.9 % IV BOLUS (SEPSIS)
500.0000 mL | Freq: Once | INTRAVENOUS | Status: AC
  Administered 2017-08-30: 500 mL via INTRAVENOUS

## 2017-08-30 MED ORDER — AMIODARONE HCL IN DEXTROSE 360-4.14 MG/200ML-% IV SOLN
60.0000 mg/h | INTRAVENOUS | Status: AC
  Administered 2017-08-30 (×2): 60 mg/h via INTRAVENOUS
  Filled 2017-08-30 (×2): qty 200

## 2017-08-30 MED ORDER — ALBUMIN HUMAN 25 % IV SOLN
25.0000 g | Freq: Once | INTRAVENOUS | Status: AC
  Administered 2017-08-30: 25 g via INTRAVENOUS
  Filled 2017-08-30: qty 100

## 2017-08-30 MED ORDER — AMIODARONE LOAD VIA INFUSION
150.0000 mg | Freq: Once | INTRAVENOUS | Status: AC
  Administered 2017-08-30: 150 mg via INTRAVENOUS
  Filled 2017-08-30: qty 83.34

## 2017-08-30 MED ORDER — SODIUM CHLORIDE 0.9 % IV BOLUS (SEPSIS)
1000.0000 mL | Freq: Once | INTRAVENOUS | Status: AC
  Administered 2017-08-30: 1000 mL via INTRAVENOUS

## 2017-08-30 MED ORDER — ETOMIDATE 2 MG/ML IV SOLN
10.0000 mg | Freq: Once | INTRAVENOUS | Status: AC
  Administered 2017-08-30: 10 mg via INTRAVENOUS

## 2017-08-30 MED ORDER — FENTANYL CITRATE (PF) 100 MCG/2ML IJ SOLN
50.0000 ug | INTRAMUSCULAR | Status: DC | PRN
  Administered 2017-08-30 – 2017-09-01 (×15): 50 ug via INTRAVENOUS
  Filled 2017-08-30 (×10): qty 2

## 2017-08-30 MED ORDER — FENTANYL CITRATE (PF) 100 MCG/2ML IJ SOLN
50.0000 ug | INTRAMUSCULAR | Status: AC | PRN
  Administered 2017-08-30 – 2017-09-01 (×3): 50 ug via INTRAVENOUS
  Filled 2017-08-30 (×9): qty 2

## 2017-08-30 MED ORDER — PIPERACILLIN-TAZOBACTAM 3.375 G IVPB 30 MIN: 3.3750 g | Freq: Three times a day (TID) | INTRAVENOUS | Status: DC

## 2017-08-30 MED ORDER — PHENYLEPHRINE HCL 10 MG/ML IJ SOLN
0.0000 ug/min | INTRAMUSCULAR | Status: DC
  Administered 2017-08-30: 100 ug/min via INTRAVENOUS
  Administered 2017-08-30: 120 ug/min via INTRAVENOUS
  Administered 2017-08-30: 90 ug/min via INTRAVENOUS
  Administered 2017-08-30: 80 ug/min via INTRAVENOUS
  Administered 2017-08-30: 100 ug/min via INTRAVENOUS
  Administered 2017-08-30: 60 ug/min via INTRAVENOUS
  Administered 2017-08-30: 100 ug/min via INTRAVENOUS
  Administered 2017-08-30: 90 ug/min via INTRAVENOUS
  Administered 2017-08-30: 70 ug/min via INTRAVENOUS
  Administered 2017-08-30: 50 ug/min via INTRAVENOUS
  Administered 2017-08-31: 80 ug/min via INTRAVENOUS
  Administered 2017-08-31: 70 ug/min via INTRAVENOUS
  Administered 2017-08-31: 20 ug/min via INTRAVENOUS
  Filled 2017-08-30 (×3): qty 4

## 2017-08-30 MED ORDER — AMPICILLIN-SULBACTAM SODIUM 3 (2-1) G IJ SOLR
3.0000 g | Freq: Two times a day (BID) | INTRAMUSCULAR | Status: DC
  Filled 2017-08-30 (×5): qty 3

## 2017-08-30 MED ORDER — VANCOMYCIN HCL 10 G IV SOLR
1750.0000 mg | Freq: Once | INTRAVENOUS | Status: AC
  Administered 2017-08-30: 1750 mg via INTRAVENOUS
  Filled 2017-08-30: qty 1750

## 2017-08-30 MED ORDER — VANCOMYCIN HCL IN DEXTROSE 1-5 GM/200ML-% IV SOLN: 1000.0000 mg | INTRAVENOUS | Status: DC

## 2017-08-30 MED ORDER — MIDAZOLAM HCL 2 MG/2ML IJ SOLN
1.0000 mg | INTRAMUSCULAR | Status: DC | PRN
  Administered 2017-08-30: 1 mg via INTRAVENOUS

## 2017-08-30 MED ORDER — MIDAZOLAM HCL 2 MG/2ML IJ SOLN
1.0000 mg | INTRAMUSCULAR | Status: DC | PRN
  Administered 2017-08-30 – 2017-09-01 (×6): 1 mg via INTRAVENOUS
  Filled 2017-08-30 (×7): qty 2

## 2017-08-30 MED ORDER — PIPERACILLIN-TAZOBACTAM 3.375 G IVPB
3.3750 g | Freq: Three times a day (TID) | INTRAVENOUS | Status: DC
  Administered 2017-08-30 – 2017-09-01 (×6): 3.375 g via INTRAVENOUS
  Filled 2017-08-30 (×5): qty 50

## 2017-08-30 MED ORDER — SODIUM CHLORIDE 0.9 % IV SOLN
0.0000 ug/min | INTRAVENOUS | Status: DC
  Administered 2017-08-30: 20 ug/min via INTRAVENOUS
  Filled 2017-08-30 (×2): qty 1

## 2017-08-30 MED ORDER — HYDROCORTISONE NA SUCCINATE PF 100 MG IJ SOLR
100.0000 mg | Freq: Three times a day (TID) | INTRAMUSCULAR | Status: DC
  Administered 2017-08-30 – 2017-09-01 (×6): 100 mg via INTRAVENOUS
  Filled 2017-08-30 (×6): qty 2

## 2017-08-30 MED ORDER — ETOMIDATE 2 MG/ML IV SOLN
INTRAVENOUS | Status: DC | PRN
  Administered 2017-08-30: 10 mg via INTRAVENOUS

## 2017-08-30 MED ORDER — AMIODARONE HCL IN DEXTROSE 360-4.14 MG/200ML-% IV SOLN
30.0000 mg/h | INTRAVENOUS | Status: DC
  Administered 2017-08-31 – 2017-09-01 (×4): 30 mg/h via INTRAVENOUS
  Filled 2017-08-30 (×3): qty 200

## 2017-08-30 MED ORDER — ORAL CARE MOUTH RINSE
15.0000 mL | Freq: Four times a day (QID) | OROMUCOSAL | Status: DC
  Administered 2017-08-30 – 2017-09-03 (×16): 15 mL via OROMUCOSAL

## 2017-08-30 MED ORDER — CHLORHEXIDINE GLUCONATE 0.12% ORAL RINSE (MEDLINE KIT)
15.0000 mL | Freq: Two times a day (BID) | OROMUCOSAL | Status: DC
  Administered 2017-08-30 – 2017-09-02 (×7): 15 mL via OROMUCOSAL

## 2017-08-30 MED ORDER — SODIUM CHLORIDE 0.9 % IV SOLN
3.0000 g | Freq: Four times a day (QID) | INTRAVENOUS | Status: DC
  Administered 2017-08-30: 3 g via INTRAVENOUS
  Filled 2017-08-30 (×12): qty 3

## 2017-08-30 MED ORDER — LORAZEPAM 2 MG/ML IJ SOLN
1.0000 mg | INTRAMUSCULAR | Status: DC | PRN
  Administered 2017-08-30: 1 mg via INTRAVENOUS
  Filled 2017-08-30: qty 1

## 2017-08-30 MED ORDER — ALBUMIN HUMAN 25 % IV SOLN
INTRAVENOUS | Status: AC
  Filled 2017-08-30: qty 100

## 2017-08-30 NOTE — Progress Notes (Signed)
Pulled et tube back to 22 at lip

## 2017-08-30 NOTE — Progress Notes (Addendum)
Notified by RN about lactate 2.5 -examined patient at bedside -remains on mechanical ventilation without respiratory distress SBP in upper 80s and low 90s  CV-IRRR, no rub Lung--bibasilar rales, no wheeze Abd-mildly distended, diminished BS, soft Ext--1+LE edema  --bolus another liter NS --titrate up phenylephrine for SBP >90 --start solucortef --albumin 25 grams x 1 --continue vancomycin and zosyn --HR trending up 125-135-->start amiodarone in setting of hypotension --long discussion with pt's sister (POA)--updated her on clinical condition and discussed pt's poor prognosis due to his age, MODS, and pre-existing co-mordities -sister confirms DNR -sister wants to continue treament for another 24 hours--if no improvement or if pt continues to decline-->transition to full comfort.  The patient is critically ill with multiple organ systems failure and requires high complexity decision making for assessment and support, frequent evaluation and titration of therapies, application of advanced monitoring technologies and extensive interpretation of multiple databases.  Critical care time - 62  mins.   Total critical care time today 107 min  DTat

## 2017-08-30 NOTE — Progress Notes (Signed)
Basically patient is still on 100 percent oxygen breathing about 34 times a minute. Vent is set at 20 with 5 peep. Will adjust to a more ARDS like protocol. Will decrease VT to 6 cc/kg -- 390 and increase rate to 34 this will catch his rate and minute volume. Will increase peep to 10 to see if we can get off the 100 percent oxygen. Blood pressure appears to be holding. With out increase in pressors. Still not exsecting good outcome from this will check abg in hour. Also urine output is still low.

## 2017-08-30 NOTE — Anesthesia Procedure Notes (Addendum)
Date/Time: 08/30/2017 12:55 AM Performed by: Vista Deck, CRNA Pre-anesthesia Checklist: Patient identified, Emergency Drugs available, Suction available, Patient being monitored and Timeout performed Patient Re-evaluated:Patient Re-evaluated prior to induction Oxygen Delivery Method: Ambu bag Preoxygenation: Pre-oxygenation with 100% oxygen Induction Type: IV induction, Rapid sequence and Cricoid Pressure applied Laryngoscope Size: 3 and Mac Grade View: Grade II Tube type: Subglottic suction tube Tube size: 8.0 mm Number of attempts: 1 Airway Equipment and Method: Rigid stylet Placement Confirmation: ETT inserted through vocal cords under direct vision,  CO2 detector and breath sounds checked- equal and bilateral Secured at: 24 cm Tube secured with: secured by Respiratory. Dental Injury: Teeth and Oropharynx as per pre-operative assessment

## 2017-08-30 NOTE — Progress Notes (Signed)
Pt was unable to maintain O2 saturations and had increased work of breathing. Pt was intubated without complications. Foley placed. NG tube still in place, New IV access established. Consent was obtained via Pt's sister who is listed in chart. Pt is at desired RASS score of -1. PRN ativan and fentanyl given.   Only 10mg  of etomidate used during intubation. Other 10mg  wasted with Jerene Pitch, Therapist, sports. No other medications were used during the process.  RN updated family.

## 2017-08-30 NOTE — Progress Notes (Addendum)
Pharmacy Antibiotic Note  Joel Bishop is a 84 y.o. male admitted on 08/28/2017 with sepsis/possible bactermeia.  Pharmacy has been consulted for Vancomycin dosing.  Plan: Vancomycin 1750 mg IV OT bolus dose Vancomycin 1000 mg IV every 24 hours.  Goal trough 15-20 mcg/mL. Also on Zosyn 3.375G IV Q8 hours  Monitor labs, c/s, vitals, and trough when needed.   Height: 5\' 6"  (167.6 cm) Weight: 204 lb 12.9 oz (92.9 kg) IBW/kg (Calculated) : 63.8  Temp (24hrs), Avg:98.9 F (37.2 C), Min:97.5 F (36.4 C), Max:99.9 F (37.7 C)  Recent Labs  Lab 08/24/17 1514 08/25/17 0400 08/28/17 1009 08/29/17 0444 08/30/17 0437  WBC  --  13.0* 19.1* 19.3* 30.9*  CREATININE 0.80 0.79 1.21 1.60* 2.23*    Estimated Creatinine Clearance: 24.9 mL/min (A) (by C-G formula based on SCr of 2.23 mg/dL (H)).    No Known Allergies  Antimicrobials this admission: Vanco 1/19 >>  Zosyn 1/19 >>   Dose adjustments this admission:   Microbiology results: 1/19 BCx: pending 1/18 UCx: insignificant growth   1/17 MRSA PCR: positive  Thank you for allowing pharmacy to be a part of this patient's care.   Margot Ables, PharmD Clinical Pharmacist 08/30/2017 11:11 AM

## 2017-08-30 NOTE — Progress Notes (Signed)
eLink Physician-Brief Progress Note Patient Name: Joel Bishop DOB: 12-09-1927 MRN: [id number]   Date of Service  08/30/2017  HPI/Events of Note  intubated  eICU Interventions  Vent /sedation orders Int fent/ versed Dc ativan     Intervention Category Major Interventions: Respiratory failure - evaluation and management  Rakesh V. Alva 08/30/2017, 1:31 AM

## 2017-08-30 NOTE — Progress Notes (Signed)
Subjective: Events of last night noted.  Patient currently intubated and hypotensive.  Objective: Vital signs in last 24 hours: Temp:  [97.5 F (36.4 C)-99.9 F (37.7 C)] 99.1 F (37.3 C) (01/19 0747) Pulse Rate:  [38-145] 119 (01/19 0747) Resp:  [16-44] 34 (01/19 0747) BP: (57-160)/(41-98) 84/62 (01/19 0747) SpO2:  [83 %-97 %] 97 % (01/19 0747) FiO2 (%):  [100 %] 100 % (01/19 0300) Weight:  [204 lb 12.9 oz (92.9 kg)] 204 lb 12.9 oz (92.9 kg) (01/19 0545) Last BM Date: 08/29/17  Intake/Output from previous day: 01/18 0701 - 01/19 0700 In: 2335.3 [I.V.:2235.3; IV Piggyback:100] Out: 1610 [Urine:1750; Emesis/NG output:700] Intake/Output this shift: Total I/O In: 3 [I.V.:3] Out: -   General appearance: Intubated, minimally responsive GI: Soft, rotund.  Minimal ostomy output noted.  Minimal bowel sounds appreciated.  No rigidity noted.  Hernia soft.  Lab Results:  Recent Labs    08/29/17 0444 08/30/17 0437  WBC 19.3* 30.9*  HGB 14.5 15.5  HCT 45.8 48.5  PLT 266 193   BMET Recent Labs    08/29/17 0444 08/30/17 0437  NA 144 148*  K 4.5 4.5  CL 111 115*  CO2 24 20*  GLUCOSE 98 85  BUN 25* 37*  CREATININE 1.60* 2.23*  CALCIUM 8.8* 8.5*   PT/INR No results for input(s): LABPROT, INR in the last 72 hours.  Studies/Results: US Abdomen Complete  Result Date: 08/29/2017 CLINICAL DATA:  Elevated liver function tests. Acute renal insufficiency. EXAM: ABDOMEN ULTRASOUND COMPLETE COMPARISON:  Abdominal CT 08/28/2017 FINDINGS: Gallbladder: The gallbladder wall is normally distended. There is a calculus measuring 1.2 cm in the neck of the gallbladder without appreciable gallbladder wall thickening. The gallbladder wall measures 1.8 mm. No pericholecystic fluid. Common bile duct: Diameter: 6.2 mm Liver: No focal lesion identified. Coarse parenchymal echogenicity. Portal vein is patent on color Doppler imaging with normal direction of blood flow towards the liver. IVC:  Limited visualization without focal abnormality. Pancreas: Not seen. Spleen: Size and appearance within normal limits. Right Kidney: Length: 10.1 cm. Echogenicity within normal limits. Moderate hydronephrosis visualized. Left Kidney: Length: 11.8 cm. Echogenicity within normal limits. Moderate hydronephrosis visualized. Abdominal aorta: Mid to distal aorta is not seen. No aneurysmal dilation of the proximal aorta. Other findings: None. IMPRESSION: Limited visualization due to ongoing small bowel obstruction. Cholelithiasis without evidence of acute cholecystitis. Bilateral moderate hydronephrosis. Electronically Signed   By: Fidela Salisbury M.D.   On: 08/29/2017 10:33   Ct Abdomen Pelvis W Contrast  Result Date: 08/28/2017 CLINICAL DATA:  Decreased urine output. Small bowel obstruction. History of prostatectomy and colostomy in 1998 EXAM: CT ABDOMEN AND PELVIS WITH CONTRAST TECHNIQUE: Multidetector CT imaging of the abdomen and pelvis was performed using the standard protocol following bolus administration of intravenous contrast. CONTRAST:  12mL ISOVUE-300 IOPAMIDOL (ISOVUE-300) INJECTION 61% COMPARISON:  None. FINDINGS: Lower chest: Small left pleural effusion. Left basilar atelectasis. Mild right basilar atelectasis. Elevation of the right diaphragm. Hepatobiliary: No focal hepatic mass. Dystrophic calcification in the right hepatic lobe adjacent to the middle portal vein. Normal gallbladder. No intrahepatic or extrahepatic biliary ductal dilatation. Pancreas: Unremarkable. No pancreatic ductal dilatation or surrounding inflammatory changes. Spleen: Normal in size without focal abnormality. Adrenals/Urinary Tract: Adrenal glands are unremarkable. Kidneys are normal, without renal calculi, focal lesion, or hydronephrosis. Multiple bladder calculi are noted. Stomach/Bowel: Severe small bowel dilatation measuring up to 5 cm in diameter with multiple air-fluid levels with a relative transition zone where  there appears to be tethering in  the lower abdomen/pelvis likely secondary to adhesions. Distal small bowel is decompressed. Colon is decompressed. No pneumatosis, pneumoperitoneum or portal venous gas. Left lower quadrant colostomy. Vascular/Lymphatic: Abdominal aortic atherosclerosis. Normal caliber abdominal aorta. No lymphadenopathy. Reproductive: Prior prostatectomy. Other: Left inguinal fat containing hernia. 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 Presacral soft tissue prominence likely related to posttreatment changes. Musculoskeletal: No acute osseous abnormality. No aggressive osseous lesion. Ankylosis of bilateral sacroiliac joints. Degenerative disc disease with disc height loss at L3-4, L4-5 and L5-S1 with bilateral facet arthropathy. IMPRESSION: 1. Severe small bowel obstruction with a relative transition zone in the lower abdomen/pelvis likely secondary to adhesions. Small bowel measures up to 5 cm in diameter. 2.  Aortic Atherosclerosis (ICD10-I70.0). 3. 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 4.  Left lower quadrant colostomy. 5. Multiple bladder calculi. 6. Small left pleural effusion. Electronically Signed   By: Kathreen Devoid   On: 08/28/2017 12:12   Portable Chest X-ray  Result Date: 08/30/2017 CLINICAL DATA:  84 year old male status post intubation. EXAM: PORTABLE CHEST 1 VIEW COMPARISON:  Chest radiograph dated 08/29/2017. FINDINGS: There has been interval placement of an endotracheal tube with tip approximately at the level of the carina. Recommend retraction of the tube by approximately 3-4 cm. Enteric tube extends below the diaphragm with side port in the left upper abdomen and tip beyond the inferior margin of the image. There is shallow inspiration with bibasilar atelectatic changes. A small left pleural effusion may be present. Pneumonia is not excluded. There is no pneumothorax. Stable mild  cardiomegaly. No acute osseous pathology. IMPRESSION: 1. Endotracheal tube at the level of the carina. Recommend retraction by approximately 3-4 cm for optimal positioning. 2. Shallow inspiration with bibasilar atelectasis. Possible small left pleural effusion. These results were called by telephone at the time of interpretation on 08/30/2017 at 2:03 am to Dr. Olevia Bowens, who verbally acknowledged these results. Electronically Signed   By: Anner Crete M.D.   On: 08/30/2017 02:17   Dg Chest Port 1 View  Result Date: 08/29/2017 CLINICAL DATA:  NG tube placement EXAM: PORTABLE CHEST 1 VIEW COMPARISON:  08/29/2017 FINDINGS: Cardiomegaly with vascular congestion. Small left effusion and patchy basilar atelectasis or infiltrate on the left. Esophageal tube tip extends below diaphragm but tip is incompletely included. IMPRESSION: 1. Esophageal tube tip is below the diaphragm but incompletely included on the current image. 2. Cardiomegaly with vascular congestion. Patchy atelectasis or infiltrate at the left base Electronically Signed   By: Donavan Foil M.D.   On: 08/29/2017 23:26   Dg Chest Port 1 View  Result Date: 08/29/2017 CLINICAL DATA:  Sudden onset shortness of breath EXAM: PORTABLE CHEST 1 VIEW COMPARISON:  08/24/2017, 08/20/2017 FINDINGS: Cardiomegaly with vascular congestion and mild interstitial opacities suggesting edema. Patchy atelectasis or infiltrate at the left base. No pneumothorax. IMPRESSION: 1. Cardiomegaly with vascular congestion and mild interstitial opacity suggesting minimal edema. 2. Patchy atelectasis versus minimal infiltrate at the left base. Electronically Signed   By: Donavan Foil M.D.   On: 08/29/2017 23:16   Dg Abd Portable 2v  Result Date: 08/29/2017 CLINICAL DATA:  Shortness of breath, history of colon cancer EXAM: PORTABLE ABDOMEN - 2 VIEW COMPARISON:  CT abdomen 08/28/2017 FINDINGS: There is persistent severe dilatation of the small bowel. There is no evidence of  pneumoperitoneum, portal venous gas or pneumatosis. There are no pathologic calcifications along the expected course of the ureters. There is contrast in the bladder from recent IV contrast  injection. There is stable cardiomegaly. There elevation of the right diaphragm. The osseous structures are unremarkable. IMPRESSION: Persistent small-bowel obstruction. Electronically Signed   By: Kathreen Devoid   On: 08/29/2017 09:47    Anti-infectives: Anti-infectives (From admission, onward)   Start     Dose/Rate Route Frequency Ordered Stop   08/30/17 2000  Ampicillin-Sulbactam (UNASYN) 3 g in sodium chloride 0.9 % 100 mL IVPB  Status:  Discontinued     3 g 200 mL/hr over 30 Minutes Intravenous Every 12 hours 08/30/17 0849 08/30/17 1007   08/30/17 1400  piperacillin-tazobactam (ZOSYN) IVPB 3.375 g  Status:  Discontinued     3.375 g 100 mL/hr over 30 Minutes Intravenous Every 8 hours 08/30/17 1007 08/30/17 1043   08/30/17 1400  piperacillin-tazobactam (ZOSYN) IVPB 3.375 g     3.375 g 12.5 mL/hr over 240 Minutes Intravenous Every 8 hours 08/30/17 1044     08/30/17 0200  Ampicillin-Sulbactam (UNASYN) 3 g in sodium chloride 0.9 % 100 mL IVPB  Status:  Discontinued     3 g 200 mL/hr over 30 Minutes Intravenous Every 6 hours 08/30/17 0135 08/30/17 0849      Assessment/Plan: Impression: Aspiration pneumonia, currently on ventilator.  Ultrasound shows a gallstone near the neck of the gallbladder, but no pericholecystic fluid present.  Should patient develop cholecystitis, percutaneous cholecystostomy tube would be warranted.  Would continue NG tube decompression.  Patient not a surgical candidate at this time.  Prognosis is guarded.  LOS: 2 days    Aviva Signs 08/30/2017

## 2017-08-30 NOTE — Consult Note (Addendum)
Consult requested by: [facility] hospitalist, DR Tat Consult requested for: Respiratory failure  HPI: This is an 84 year old who was hospitalized last week with a subarachnoid hemorrhage.  He had been on Coumadin for chronic atrial fib and this was discontinued.  He was also found to have a urinary tract infection and was treated with Cipro.  He went to the nursing home.  At the nursing home he started having trouble with nausea vomiting and abdominal fullness and he was brought to the emergency department again on the 13th but his symptoms resolved.  He continued having a sensation of abdominal fullness nausea and then started having increasing pain.  He has had poor output from colostomy.  He has past medical history of colon cancer with partial colectomy and permanent colostomy in 1990.  He was found to have small bowel obstruction and was admitted.  Surgical consultation was obtained.  He developed increasing problems through the night last night with vomiting tachypnea hypoxia tachycardia respiratory distress.  Nasogastric tube was placed he was started on oxygen he was given bronchodilators but because of his continued clinical deterioration he ended up being intubated and placed on mechanical ventilation.  He has aspiration pneumonia.  He may be septic.  There is a note that he was to have a lactate level done but I do not see the results.  Arterial blood gas at 2:00 this morning showed his pH was 7.26 PO2 71.6 PCO2 40.8 on 100% oxygen.  His renal function has deteriorated from a creatinine of 1.6 yesterday to a creatinine of 2.23 today.  His CO2 has gone down from 24-20.  GFR estimated at 25.  White blood count has gone from 19,300-30,900.  History is from the medical record because he is intubated and on mechanical ventilation.  Past Medical History:  Diagnosis Date  . At risk for falls   . Atrial fibrillation ([place])   . Chronic anticoagulation 2002  . Colon carcinoma ([place]) 1998   s/p partial  colectomy and permanent colostomyin 1998  . Colostomy in place [facility])   . Glaucoma   . Glaucoma   . Hypertension   . Nontraumatic subarachnoid hemorrhage ([place])   . Overweight(278.02)   . Paroxysmal atrial fibrillation ([place]) 2002   Single episode by history during a noncardiac illness  . UTI (urinary tract infection)      Family History  Problem Relation Age of Onset  . Cancer Mother        gallbladder  . Uterine cancer Maternal Aunt   . Pancreatic cancer Brother   . Liver cancer Sister   . Other Brother        amylodosis  . Kidney cancer Sister      Social History   Socioeconomic History  . Marital status: Married    Spouse name: None  . Number of children: None  . Years of education: None  . Highest education level: None  Social Needs  . Financial resource strain: None  . Food insecurity - worry: None  . Food insecurity - inability: None  . Transportation needs - medical: None  . Transportation needs - non-medical: None  Occupational History  . Occupation: PHD  Tobacco Use  . Smoking status: Never Smoker  . Smokeless tobacco: Never Used  Substance and Sexual Activity  . Alcohol use: No    Alcohol/week: 0.0 oz  . Drug use: No  . Sexual activity: None  Other Topics Concern  . None  Social History Narrative  . None  ROS: Unable to obtain    Objective: Vital signs in last 24 hours: Temp:  [97.5 F (36.4 C)-99.9 F (37.7 C)] 99.1 F (37.3 C) (01/19 0747) Pulse Rate:  [38-145] 119 (01/19 0747) Resp:  [16-44] 34 (01/19 0747) BP: (57-160)/(41-101) 84/62 (01/19 0747) SpO2:  [83 %-97 %] 97 % (01/19 0747) FiO2 (%):  [100 %] 100 % (01/19 0300) Weight:  [92.9 kg (204 lb 12.9 oz)] 92.9 kg (204 lb 12.9 oz) (01/19 0545) Weight change: -7.798 kg (-3.1 oz) Last BM Date: 08/29/17  Intake/Output from previous day: 01/18 0701 - 01/19 0700 In: 2335.3 [I.V.:2235.3; IV Piggyback:100] Out: 3903 [Urine:1750; Emesis/NG output:700]  PHYSICAL EXAM Constitutional:  He is intubated and on mechanical ventilation.  Eyes: Pupils react ears nose mouth and throat: He has tubes in his mouth.  Throat is clear.  Cardiovascular: His heart shows atrial fib with rapid ventricular response at about 120.  Respiratory: His respirations are at about 30.  He has bilateral rales and rhonchi.  Gastrointestinal: His abdomen still seems protuberant.  He has a colostomy.  He has a gastric tube in place is draining dark material musculoskeletal: Unable to assess he is moving his arms.  Neurological: Unable to assess psychiatric: Unable to assess  Lab Results: Basic Metabolic Panel: Recent Labs    08/28/17 1014 08/29/17 0444 08/30/17 0437  NA  --  144 148*  K  --  4.5 4.5  CL  --  111 115*  CO2  --  24 20*  GLUCOSE  --  98 85  BUN  --  25* 37*  CREATININE  --  1.60* 2.23*  CALCIUM  --  8.8* 8.5*  MG 2.4  --   --    Liver Function Tests: Recent Labs    08/28/17 1009  AST 57*  ALT 52  ALKPHOS 116  BILITOT 1.2  PROT 8.3*  ALBUMIN 3.2*   Recent Labs    08/28/17 1009  LIPASE 22   No results for input(s): AMMONIA in the last 72 hours. CBC: Recent Labs    08/28/17 1009 08/29/17 0444 08/30/17 0437  WBC 19.1* 19.3* 30.9*  NEUTROABS 15.4*  --   --   HGB 14.8 14.5 15.5  HCT 44.9 45.8 48.5  MCV 96.8 99.6 102.3*  PLT 270 266 193   Cardiac Enzymes: No results for input(s): CKTOTAL, CKMB, CKMBINDEX, TROPONINI in the last 72 hours. BNP: No results for input(s): PROBNP in the last 72 hours. D-Dimer: No results for input(s): DDIMER in the last 72 hours. CBG: No results for input(s): GLUCAP in the last 72 hours. Hemoglobin A1C: No results for input(s): HGBA1C in the last 72 hours. Fasting Lipid Panel: No results for input(s): CHOL, HDL, LDLCALC, TRIG, CHOLHDL, LDLDIRECT in the last 72 hours. Thyroid Function Tests: No results for input(s): TSH, T4TOTAL, FREET4, T3FREE, THYROIDAB in the last 72 hours. Anemia Panel: No results for input(s): VITAMINB12,  FOLATE, FERRITIN, TIBC, IRON, RETICCTPCT in the last 72 hours. Coagulation: No results for input(s): LABPROT, INR in the last 72 hours. Urine Drug Screen: Drugs of Abuse  No results found for: LABOPIA, COCAINSCRNUR, LABBENZ, AMPHETMU, THCU, LABBARB  Alcohol Level: No results for input(s): ETH in the last 72 hours. Urinalysis: Recent Labs    08/29/17 0726  COLORURINE YELLOW  LABSPEC 1.017  PHURINE 5.0  GLUCOSEU NEGATIVE  HGBUR MODERATE*  BILIRUBINUR NEGATIVE  KETONESUR NEGATIVE  PROTEINUR 30*  NITRITE NEGATIVE  LEUKOCYTESUR NEGATIVE   Misc. Labs:   ABGS: Recent Labs  08/30/17 0200  PHART 7.269*  PO2ART 71.6*  HCO3 17.9*     MICROBIOLOGY: Recent Results (from the past 240 hour(s))  MRSA PCR Screening     Status: Abnormal   Collection Time: 08/28/17  4:55 PM  Result Value Ref Range Status   MRSA by PCR POSITIVE (A) NEGATIVE Final    Comment: RESULT CALLED TO, READ BACK BY AND VERIFIED WITH: HOWERTON M. @ [id number] ON [id number]        The GeneXpert MRSA Assay (FDA approved for NASAL specimens only), is one component of a comprehensive MRSA colonization surveillance program. It is not intended to diagnose MRSA infection nor to guide or monitor treatment for MRSA infections.     Studies/Results: US Abdomen Complete  Result Date: 08/29/2017 CLINICAL DATA:  Elevated liver function tests. Acute renal insufficiency. EXAM: ABDOMEN ULTRASOUND COMPLETE COMPARISON:  Abdominal CT 08/28/2017 FINDINGS: Gallbladder: The gallbladder wall is normally distended. There is a calculus measuring 1.2 cm in the neck of the gallbladder without appreciable gallbladder wall thickening. The gallbladder wall measures 1.8 mm. No pericholecystic fluid. Common bile duct: Diameter: 6.2 mm Liver: No focal lesion identified. Coarse parenchymal echogenicity. Portal vein is patent on color Doppler imaging with normal direction of blood flow towards the liver. IVC: Limited visualization without focal  abnormality. Pancreas: Not seen. Spleen: Size and appearance within normal limits. Right Kidney: Length: 10.1 cm. Echogenicity within normal limits. Moderate hydronephrosis visualized. Left Kidney: Length: 11.8 cm. Echogenicity within normal limits. Moderate hydronephrosis visualized. Abdominal aorta: Mid to distal aorta is not seen. No aneurysmal dilation of the proximal aorta. Other findings: None. IMPRESSION: Limited visualization due to ongoing small bowel obstruction. Cholelithiasis without evidence of acute cholecystitis. Bilateral moderate hydronephrosis. Electronically Signed   By: Fidela Salisbury M.D.   On: 08/29/2017 10:33   Ct Abdomen Pelvis W Contrast  Result Date: 08/28/2017 CLINICAL DATA:  Decreased urine output. Small bowel obstruction. History of prostatectomy and colostomy in 1998 EXAM: CT ABDOMEN AND PELVIS WITH CONTRAST TECHNIQUE: Multidetector CT imaging of the abdomen and pelvis was performed using the standard protocol following bolus administration of intravenous contrast. CONTRAST:  120m ISOVUE-300 IOPAMIDOL (ISOVUE-300) INJECTION 61% COMPARISON:  None. FINDINGS: Lower chest: Small left pleural effusion. Left basilar atelectasis. Mild right basilar atelectasis. Elevation of the right diaphragm. Hepatobiliary: No focal hepatic mass. Dystrophic calcification in the right hepatic lobe adjacent to the middle portal vein. Normal gallbladder. No intrahepatic or extrahepatic biliary ductal dilatation. Pancreas: Unremarkable. No pancreatic ductal dilatation or surrounding inflammatory changes. Spleen: Normal in size without focal abnormality. Adrenals/Urinary Tract: Adrenal glands are unremarkable. Kidneys are normal, without renal calculi, focal lesion, or hydronephrosis. Multiple bladder calculi are noted. Stomach/Bowel: Severe small bowel dilatation measuring up to 5 cm in diameter with multiple air-fluid levels with a relative transition zone where there appears to be tethering in the  lower abdomen/pelvis likely secondary to adhesions. Distal small bowel is decompressed. Colon is decompressed. No pneumatosis, pneumoperitoneum or portal venous gas. Left lower quadrant colostomy. Vascular/Lymphatic: Abdominal aortic atherosclerosis. Normal caliber abdominal aorta. No lymphadenopathy. Reproductive: Prior prostatectomy. Other: Left inguinal fat containing hernia. 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 Presacral soft tissue prominence likely related to posttreatment changes. Musculoskeletal: No acute osseous abnormality. No aggressive osseous lesion. Ankylosis of bilateral sacroiliac joints. Degenerative disc disease with disc height loss at L3-4, L4-5 and L5-S1 with bilateral facet arthropathy. IMPRESSION: 1. Severe small bowel obstruction with a relative transition zone in the lower abdomen/pelvis likely secondary to adhesions. Small  bowel measures up to 5 cm in diameter. 2.  Aortic Atherosclerosis (ICD10-I70.0). 3. 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 4.  Left lower quadrant colostomy. 5. Multiple bladder calculi. 6. Small left pleural effusion. Electronically Signed   By: Kathreen Devoid   On: 08/28/2017 12:12   Portable Chest X-ray  Result Date: 08/30/2017 CLINICAL DATA:  84 year old male status post intubation. EXAM: PORTABLE CHEST 1 VIEW COMPARISON:  Chest radiograph dated 08/29/2017. FINDINGS: There has been interval placement of an endotracheal tube with tip approximately at the level of the carina. Recommend retraction of the tube by approximately 3-4 cm. Enteric tube extends below the diaphragm with side port in the left upper abdomen and tip beyond the inferior margin of the image. There is shallow inspiration with bibasilar atelectatic changes. A small left pleural effusion may be present. Pneumonia is not excluded. There is no pneumothorax. Stable mild cardiomegaly. No acute osseous pathology.  IMPRESSION: 1. Endotracheal tube at the level of the carina. Recommend retraction by approximately 3-4 cm for optimal positioning. 2. Shallow inspiration with bibasilar atelectasis. Possible small left pleural effusion. These results were called by telephone at the time of interpretation on 08/30/2017 at 2:03 am to Dr. Olevia Bowens, who verbally acknowledged these results. Electronically Signed   By: Anner Crete M.D.   On: 08/30/2017 02:17   Dg Chest Port 1 View  Result Date: 08/29/2017 CLINICAL DATA:  NG tube placement EXAM: PORTABLE CHEST 1 VIEW COMPARISON:  08/29/2017 FINDINGS: Cardiomegaly with vascular congestion. Small left effusion and patchy basilar atelectasis or infiltrate on the left. Esophageal tube tip extends below diaphragm but tip is incompletely included. IMPRESSION: 1. Esophageal tube tip is below the diaphragm but incompletely included on the current image. 2. Cardiomegaly with vascular congestion. Patchy atelectasis or infiltrate at the left base Electronically Signed   By: Donavan Foil M.D.   On: 08/29/2017 23:26   Dg Chest Port 1 View  Result Date: 08/29/2017 CLINICAL DATA:  Sudden onset shortness of breath EXAM: PORTABLE CHEST 1 VIEW COMPARISON:  08/24/2017, 08/20/2017 FINDINGS: Cardiomegaly with vascular congestion and mild interstitial opacities suggesting edema. Patchy atelectasis or infiltrate at the left base. No pneumothorax. IMPRESSION: 1. Cardiomegaly with vascular congestion and mild interstitial opacity suggesting minimal edema. 2. Patchy atelectasis versus minimal infiltrate at the left base. Electronically Signed   By: Donavan Foil M.D.   On: 08/29/2017 23:16   Dg Abd Portable 2v  Result Date: 08/29/2017 CLINICAL DATA:  Shortness of breath, history of colon cancer EXAM: PORTABLE ABDOMEN - 2 VIEW COMPARISON:  CT abdomen 08/28/2017 FINDINGS: There is persistent severe dilatation of the small bowel. There is no evidence of pneumoperitoneum, portal venous gas or pneumatosis.  There are no pathologic calcifications along the expected course of the ureters. There is contrast in the bladder from recent IV contrast injection. There is stable cardiomegaly. There elevation of the right diaphragm. The osseous structures are unremarkable. IMPRESSION: Persistent small-bowel obstruction. Electronically Signed   By: Kathreen Devoid   On: 08/29/2017 09:47    Medications:  Prior to Admission:  Medications Prior to Admission  Medication Sig Dispense Refill Last Dose  . diltiazem (CARDIZEM CD) 240 MG 24 hr capsule Take 1 capsule (240 mg total) by mouth daily.   08/25/2017 at 0900  . latanoprost (XALATAN) 0.005 % ophthalmic solution Place 1 drop into both eyes at bedtime.    08/27/2017 at 1931  . pantoprazole (PROTONIX) 40 MG tablet Take 40 mg by mouth daily.  08/27/2017 at 0900  . timolol (BETIMOL) 0.25 % ophthalmic solution Place 1 drop into the left eye 2 (two) times daily.    08/28/2017 at 0927  . ciprofloxacin (CIPRO) 500 MG tablet Take 500 mg by mouth 2 (two) times daily.   Completed Course at Unknown time   Scheduled: . chlorhexidine gluconate (MEDLINE KIT)  15 mL Mouth Rinse BID  . Chlorhexidine Gluconate Cloth  6 each Topical Q0600  . feeding supplement  1 Container Oral BID BM  . feeding supplement (PRO-STAT SUGAR FREE 64)  30 mL Oral BID  . latanoprost  1 drop Both Eyes QHS  . mouth rinse  15 mL Mouth Rinse QID  . metoprolol tartrate  5 mg Intravenous Q6H  . mupirocin ointment  1 application Nasal BID  . pantoprazole (PROTONIX) IV  40 mg Intravenous Q24H  . sodium chloride flush  3 mL Intravenous Q12H  . timolol  1 drop Left Eye BID   Continuous: . 0.9 % NaCl with KCl 20 mEq / L 100 mL/hr at 08/30/17 0443  . ampicillin-sulbactam (UNASYN) IV    . diltiazem (CARDIZEM) infusion 10 mg/hr (08/30/17 0013)  . phenylephrine (NEO-SYNEPHRINE) Adult infusion    . sodium chloride     IRC:VELFYBOF (SUBLIMAZE) injection, fentaNYL (SUBLIMAZE) injection, magnesium hydroxide,  midazolam, midazolam, ondansetron **OR** ondansetron (ZOFRAN) IV  Assesment: He was admitted with small bowel obstruction.  He has had a recent subarachnoid hemorrhage.  He has aspiration pneumonia and is septic.  Lactate level has been ordered but has not been resulted yet but he does have what appears to be a mostly metabolic acidemia.  He is on appropriate antibiotics.  He has received a fluid bolus and his blood pressure is doing better.  Oxygenation earlier this morning was fair on 100% oxygen and I am going to get another blood gas to see where he is now.  He has chronic atrial fib and had been on Coumadin but is off now.  He has a history of colon cancer and colostomy  He has a history of hypertension but currently is hypotensive.  He has worsening acute kidney injury  He is clearly critically ill.   Principal Problem:   Small bowel obstruction (H[facility]) Active Problems:   SAH (subarachnoid hemorrhage) (H[facility])   Hypokalemia   AF (paroxysmal atrial fibrillation) ([facility])   Leucocytosis   Essential hypertension   AKI (acute kidney injury) ([place])    Plan: Since his blood pressure is still marginal we will give him another liter of IV fluids.  Neo-Synephrine is ordered but his blood pressure did improve with hydration.  If he continues to have trouble with atrial fib with rapid ventricular response amiodarone would probably be the best choice as it has less effect on blood pressure than her other agents.  Trying to hold off on much sedation and he has done okay on intermittent sedation.  Prognosis is poor    LOS: 2 days   Nolah Krenzer L 08/30/2017, 9:04 AM

## 2017-08-30 NOTE — Progress Notes (Signed)
Nutrition Follow-up  DOCUMENTATION CODES:  Obesity unspecified  INTERVENTION:  D/C supplements given NPO status and apparent diet intolerance.   Given patient's poor intake/weight loss prior over the past week and now critical illness, would recommend TPN given ongoing SBO and if aggressive care is desired  NUTRITION DIAGNOSIS:   Inadequate oral intake related to inability to eat, altered GI function(SBO) as evidenced by NPO status and loss of 3.5% bw x 1 week  Ongoing  GOAL:  Provide needs based on ASPEN/SCCM guidelines  Not Met  MONITOR:  Diet advancement, Vent status, Weight trends, I & O's  REASON FOR ASSESSMENT:  Ventilator    ASSESSMENT:  84 y/o male PMHx Colon carcinoma s/p colectomy w/ colostomy, afib, HTN. Recently hospitalized 1/9-1/12 after falling at home and suffered small SAH. Also found to have UTI. He discharged briefly to SNF. Returned to hospital w/ 1 week nausea and progressively worseining abdominal fullness/pain as well as low colostomy output. Found to have AKI and Imaging revealed SBO and pt admitted for management.   Patient being briefly followed up on d/t change in status w/ intubation.   Pt had eaten dinner yesterday and then developed nausea/emesis w/ abdominal distension. Developed respiratory distress shortly after and ultimately failed non-invasive treatments requiring transfer to ICU w/ intubation.  NG placed for drainage.   Wt down another ~1.5 lbs.   Adjusted Kcal/Protein needs  Given patients prolonged poor intake, weight loss and ongoing SBO, TPN is indicated if aggressive care is desired.   Patient is currently intubated on ventilator support MV: 19.5 L/min Temp (24hrs), Avg:98.9 F (37.2 C), Min:97.5 F (36.4 C), Max:99.9 F (37.7 C)   Labs: AKI, BUN/creat: 37/2.23, WBC: 30.9, K: 4.5, BGs 85-110 Meds: IVF, IV abx, Pressor Support: Neosynephrine, Prn Fentanyl/versed  Recent Labs  Lab 08/28/17 1009 08/28/17 1014 08/29/17 0444  08/30/17 0437  NA 138  --  144 148*  K 3.3*  --  4.5 4.5  CL 101  --  111 115*  CO2 26  --  24 20*  BUN 20  --  25* 37*  CREATININE 1.21  --  1.60* 2.23*  CALCIUM 9.3  --  8.8* 8.5*  MG  --  2.4  --   --   GLUCOSE 110*  --  98 85   NUTRITION - FOCUSED PHYSICAL EXAM: Unable to assess  Diet Order:  Diet NPO time specified  EDUCATION NEEDS:  No education needs have been identified at this time  Skin:  MSAD to sacrum/abdominal   Last BM:  1/18  Height:  Ht Readings from Last 1 Encounters:  08/30/17 _0  (1.676 m)   Weight:  Wt Readings from Last 1 Encounters:  08/30/17 204 lb 12.9 oz (92.9 kg)   Wt Readings from Last 10 Encounters:  08/30/17 204 lb 12.9 oz (92.9 kg)  08/24/17 222 lb (100.7 kg)  08/20/17 222 lb (100.7 kg)  09/06/16 222 lb (100.7 kg)  08/24/15 222 lb (100.7 kg)  08/31/14 225 lb (102.1 kg)  04/01/13 221 lb 9.6 oz (100.5 kg)  01/13/13 226 lb (102.5 kg)  01/13/12 220 lb (99.8 kg)  07/08/11 207 lb (93.9 kg)   Ideal Body Weight:  64.55 kg  BMI:  Body mass index is 33.06 kg/m.  Estimated Nutritional Needs:  Kcal:  1020-1300 kcals (11-14 kcal/kg bw) Protein:  >130 g Pro (2g/kg ibw) Fluid:  Per MD goals  Burtis Junes RD, LDN, CNSC Clinical Nutrition Pager: [phone] 08/30/2017 9:38 AM

## 2017-08-30 NOTE — Discharge Instructions (Signed)
24-Hour Urine Collection  How do I do a 24-hour urine collection?  · When you get up in the morning, urinate in the toilet and flush. Write down the time. This will be your start time on the day of collection and your end time on the next morning.  · From then on, collect all of your urine in the plastic jug that is given to you.  · Stop collecting your urine 24 hours after you started.  · If the plastic jug that is given to you already has liquid in it, that is okay. Do not throw out the liquid or rinse out the jug. Some tests need the liquid to be added to your urine.  · Keep your plastic jug cool in an ice chest or keep it in the refrigerator during the test.  · When 24 hours are over, bring your plastic jug to the clinic lab. Keep the jug cool in an ice chest while you are bringing it to the lab.  This information is not intended to replace advice given to you by your health care provider. Make sure you discuss any questions you have with your health care provider.  Document Released: 10/25/2008 Document Revised: 04/01/2016 Document Reviewed: 12/22/2013  [employer]vier Interactive Patient Education © 2018 [employer]

## 2017-08-30 NOTE — Progress Notes (Signed)
PROGRESS NOTE  Joel Bishop ACZ:[id number] DOB: [birth year] DOA: 08/28/2017 PCP: Rosita Fire, MD   Brief History:  84 year old male with a history of colon cancer status post colostomy 1998, paroxysmal atrial fibrillation, hypertension, and recent subarachnoid hemorrhage presenting with nearly 1 week history of intermittent abdominal fullness.  The patient was recently admitted to the hospital from 08/20/17 through 08/23/17 secondary to a subarachnoid hemorrhage resulting from a fall at home.  As result, the patient was taken off of his warfarin after INR reversal.  The patient also was treated for UTI with ciprofloxacin which she finished on 08/26/2017.  He presented to the emergency department 08/24/2017 with nausea, vomiting, and abdominal fullness. Abdominal x-ray Suggests inability to rule out underlying bowel obstruction but since Patient's symptoms had resolved in the ED aith normal abdominal exam and Patient tolerating diet without any problem he was discharged back to SNF. He was then seen by physician at the facility next day and again at that time he did not have any complaint and was tolerating diet without a problem.  Since then, the patient has had intermittent abdominal fullness and nausea.  On the morning of 08/28/2017, the patient had worsening abdominal pain associated with nausea and vomiting.  He also reported low colostomy output times 2 days.  CT of the abdomen and pelvis in the emergency department showed small bowel dilatation up to 5 cm with air-fluid levels with a transition zone in the lower abdomen and pelvis. In the evening of 08/29/2017, the patient had an episode of emesis after which he  developed respiratory distress.  He subsequently required intubation.  He subsequently became hypotensive requiring aggressive fluid resuscitation.    Assessment/Plan: Sepsis -Secondary to aspiration pneumonia, but cannot rule out bacteremia -Check procalcitonin -Lactic acid  start vancomycin and Zosyn pending culture data acute respiratory failure with hypoxia -Urinalysis negative for pyuria -Personally reviewed chest x-ray--vague RLL opacity  Acute respiratory failure with hypoxia -Secondary to aspiration pneumonia -Intubated and mechanically ventilated -Appreciate Dr. Luan Pulling consult -Wean to extubation as tolerated  Aspiration pneumonia -Zosyn as discussed   Small bowel obstruction -Continue IV fluids -Remain n.p.o. -General surgery consult appreciated -Continue bowel rest  Cholelithiasis -abd US--GB neck stone -discussed with Dr. Aviva Signs -start zosyn  Atrial fibrillation with RVR -Continue diltiazem at current dose due to hypotension -start amiodarone if continued worsening -Warfarin discontinued recently secondary to subarachnoid hemorrhage  AKI/Hydronephrosis -Likely secondary to volume depletion/ATN/Sepsis -Continue IV fluids -Abdominal ultrasound--bilateral moderate hydronephrosis; cholelithiasis with GB neck stone without GB wall thickening or ductal dilatation -discussed with urology, Dr. Silvio Pate will review films and develop plan  Leukocytosis -likely due to stress demargination -check UA and urine culture -am CBC  Essential hypertension -IV metoprolol as discussed above  Recent subarachnoid hemorrhage -Anticoagulation discontinued indefinitely  Hypokalemia -repleted -am BMP    Disposition Plan:   critically ill--remain in ICU  Family Communication:  Sister (POA) updated--remain FULL CODE  Consultants:  General surgery  Code Status:  FULL   DVT Prophylaxis:  SCDs   Procedures: As Listed in Progress Note Above  Antibiotics: Zosyn 1/19>> vanco 1/19>>  The patient is critically ill with multiple organ systems failure and requires high complexity decision making for assessment and support, frequent evaluation and titration of therapies, application of advanced monitoring technologies  and extensive interpretation of multiple databases.  Critical care time - 45 mins.      Subjective: Patient is intubated and sedated.  Events  of last evening reviewed.  No uncontrolled pain reports.  Patient put out 600 cc after NG tube placed.  Objective: Vitals:   08/30/17 0615 08/30/17 0630 08/30/17 0645 08/30/17 0747  BP: (!) 73/42 (!) 71/54 (!) 75/56 (!) 84/62  Pulse: (!) 123 (!) 118 (!) 116 (!) 119  Resp: (!) 30 (!) 28 (!) 31 (!) 34  Temp:    99.1 F (37.3 C)  TempSrc:    Axillary  SpO2: 93% 94% 97% 97%  Weight:      Height:        Intake/Output Summary (Last 24 hours) at 08/30/2017 0937 Last data filed at 08/30/2017 0500 Gross per 24 hour  Intake 2335.33 ml  Output 2150 ml  Net 185.33 ml   Weight change: -7.798 kg (-3.1 oz) Exam:   General: intubated and sedated  HEENT: No icterus, No thrush, No neck mass, [place]/AT  Cardiovascular: IRRR, S1/S2, no rubs, no gallops  Respiratory: Bibasilar rales.  No wheezing.  Abdomen: Soft/+BS, non tender, non distended, no guarding  Extremities: 1 + LE edema, No lymphangitis, No petechiae, No rashes, no synovitis   Data Reviewed: I have personally reviewed following labs and imaging studies Basic Metabolic Panel: Recent Labs  Lab 08/24/17 1514 08/25/17 0400 08/28/17 1009 08/28/17 1014 08/29/17 0444 08/30/17 0437  NA 139 136 138  --  144 148*  K 4.3 4.1 3.3*  --  4.5 4.5  CL 101 99* 101  --  111 115*  CO2  --  23 26  --  24 20*  GLUCOSE 102* 115* 110*  --  98 85  BUN _0 --  25* 37*  CREATININE 0.80 0.79 1.21  --  1.60* 2.23*  CALCIUM  --  8.9 9.3  --  8.8* 8.5*  MG  --   --   --  2.4  --   --    Liver Function Tests: Recent Labs  Lab 08/28/17 1009  AST 57*  ALT 52  ALKPHOS 116  BILITOT 1.2  PROT 8.3*  ALBUMIN 3.2*   Recent Labs  Lab 08/28/17 1009  LIPASE 22   No results for input(s): AMMONIA in the last 168 hours. Coagulation Profile: No results for input(s): INR, PROTIME in the last 168  hours. CBC: Recent Labs  Lab 08/24/17 1514 08/25/17 0400 08/28/17 1009 08/29/17 0444 08/30/17 0437  WBC  --  13.0* 19.1* 19.3* 30.9*  NEUTROABS  --  10.9* 15.4*  --   --   HGB 15.3 16.3 14.8 14.5 15.5  HCT 45.0 47.7 44.9 45.8 48.5  MCV  --  96.2 96.8 99.6 102.3*  PLT  --  276 270 266 193   Cardiac Enzymes: No results for input(s): CKTOTAL, CKMB, CKMBINDEX, TROPONINI in the last 168 hours. BNP: Invalid input(s): POCBNP CBG: No results for input(s): GLUCAP in the last 168 hours. HbA1C: No results for input(s): HGBA1C in the last 72 hours. Urine analysis:    Component Value Date/Time   COLORURINE YELLOW 08/29/2017 0726   APPEARANCEUR CLEAR 08/29/2017 0726   LABSPEC 1.017 08/29/2017 0726   PHURINE 5.0 08/29/2017 0726   GLUCOSEU NEGATIVE 08/29/2017 0726   HGBUR MODERATE (A) 08/29/2017 0726   BILIRUBINUR NEGATIVE 08/29/2017 0726   KETONESUR NEGATIVE 08/29/2017 0726   PROTEINUR 30 (A) 08/29/2017 0726   NITRITE NEGATIVE 08/29/2017 0726   LEUKOCYTESUR NEGATIVE 08/29/2017 0726   Sepsis Labs: _1 (procalcitonin:4,lacticidven:4) ) Recent Results (from the past 240 hour(s))  MRSA PCR Screening  Status: Abnormal   Collection Time: 08/28/17  4:55 PM  Result Value Ref Range Status   MRSA by PCR POSITIVE (A) NEGATIVE Final    Comment: RESULT CALLED TO, READ BACK BY AND VERIFIED WITH: HOWERTON M. @ 2836 ON [id number]        The GeneXpert MRSA Assay (FDA approved for NASAL specimens only), is one component of a comprehensive MRSA colonization surveillance program. It is not intended to diagnose MRSA infection nor to guide or monitor treatment for MRSA infections.      Scheduled Meds: . chlorhexidine gluconate (MEDLINE KIT)  15 mL Mouth Rinse BID  . Chlorhexidine Gluconate Cloth  6 each Topical Q0600  . feeding supplement  1 Container Oral BID BM  . feeding supplement (PRO-STAT SUGAR FREE 64)  30 mL Oral BID  . latanoprost  1 drop Both Eyes QHS  . mouth rinse   15 mL Mouth Rinse QID  . metoprolol tartrate  5 mg Intravenous Q6H  . mupirocin ointment  1 application Nasal BID  . pantoprazole (PROTONIX) IV  40 mg Intravenous Q24H  . sodium chloride flush  3 mL Intravenous Q12H  . timolol  1 drop Left Eye BID   Continuous Infusions: . 0.9 % NaCl with KCl 20 mEq / L 100 mL/hr at 08/30/17 0443  . ampicillin-sulbactam (UNASYN) IV    . diltiazem (CARDIZEM) infusion 10 mg/hr (08/30/17 0013)  . phenylephrine (NEO-SYNEPHRINE) Adult infusion    . sodium chloride    . sodium chloride      Procedures/Studies: Dg Chest 1 View  Result Date: 08/20/2017 CLINICAL DATA:  Generalized weakness EXAM: CHEST 1 VIEW COMPARISON:  None. FINDINGS: Cardiomegaly and vascular pedicle widening. Congested appearance of vessels without Kerley lines or effusion. No pneumothorax or air bronchogram. Artifact from EKG leads. IMPRESSION: Cardiomegaly and vascular congestion accentuated by low volumes. Electronically Signed   By: Monte Fantasia M.D.   On: 08/20/2017 20:04   Ct Head Wo Contrast  Result Date: 08/21/2017 CLINICAL DATA:  [place] yesterday, subarachnoid hemorrhage, followup EXAM: CT HEAD WITHOUT CONTRAST TECHNIQUE: Contiguous axial images were obtained from the base of the skull through the vertex without intravenous contrast. Sagittal and coronal MPR images reconstructed from axial data set. COMPARISON:  08/20/2017 FINDINGS: Brain: Generalized atrophy. Prominent ventricular system unchanged. No midline shift or mass effect. Small vessel chronic ischemic changes of deep cerebral white matter. Again identified small amount of high attenuation acute subarachnoid hemorrhage at the interhemispheric fissure, unchanged. No new areas of intracranial hemorrhage, mass lesion, or acute infarction. No new extra-axial collections. Vascular: Atherosclerotic calcifications of internal carotid arteries at skull base Skull: Intact though demineralized Sinuses/Orbits: Clear Other: N/A IMPRESSION:  Stable appearance of a small amount of acute subarachnoid hemorrhage at the interhemispheric fissure anteriorly. Atrophy with small vessel chronic ischemic changes of deep cerebral white matter. No new intracranial abnormalities. Electronically Signed   By: Lavonia Dana M.D.   On: 08/21/2017 16:20   Ct Head Wo Contrast  Result Date: 08/20/2017 CLINICAL DATA:  84 year old male with history of confusion today. Swelling in both legs. EXAM: CT HEAD WITHOUT CONTRAST TECHNIQUE: Contiguous axial images were obtained from the base of the skull through the vertex without intravenous contrast. COMPARISON:  No priors. FINDINGS: Brain: Small amount of high attenuation in the anterior aspect of the interhemispheric fissure best appreciated on axial images 20 and 21 and coronal images 25 through 29. Patchy and confluent areas of decreased attenuation are noted throughout the deep and periventricular white  matter of the cerebral hemispheres bilaterally, compatible with chronic microvascular ischemic disease. Moderate to severe cerebral atrophy with associated ex vacuo dilatation of the ventricular system. No evidence of acute infarction, mass or mass effect. Vascular: No hyperdense vessel or unexpected calcification. Skull: Normal. Negative for fracture or focal lesion. Sinuses/Orbits: No acute finding. Other: None. IMPRESSION: 1. Small amount of subarachnoid hemorrhage noted in the anterior interhemispheric fissure. 2. Moderate to severe cerebral atrophy with probable ex vacuo dilatation of the ventricular system. The possibility of normal pressure hydrocephalus (NPH) is not entirely excluded. 3. Extensive chronic microvascular ischemic changes throughout the cerebral white matter, as above. Critical Value/emergent results were called by telephone at the time of interpretation on 08/20/2017 at 8:23 pm to Dr. Nat Christen, who verbally acknowledged these results. Electronically Signed   By: Vinnie Langton M.D.   On: 08/20/2017  20:23   US Abdomen Complete  Result Date: 08/29/2017 CLINICAL DATA:  Elevated liver function tests. Acute renal insufficiency. EXAM: ABDOMEN ULTRASOUND COMPLETE COMPARISON:  Abdominal CT 08/28/2017 FINDINGS: Gallbladder: The gallbladder wall is normally distended. There is a calculus measuring 1.2 cm in the neck of the gallbladder without appreciable gallbladder wall thickening. The gallbladder wall measures 1.8 mm. No pericholecystic fluid. Common bile duct: Diameter: 6.2 mm Liver: No focal lesion identified. Coarse parenchymal echogenicity. Portal vein is patent on color Doppler imaging with normal direction of blood flow towards the liver. IVC: Limited visualization without focal abnormality. Pancreas: Not seen. Spleen: Size and appearance within normal limits. Right Kidney: Length: 10.1 cm. Echogenicity within normal limits. Moderate hydronephrosis visualized. Left Kidney: Length: 11.8 cm. Echogenicity within normal limits. Moderate hydronephrosis visualized. Abdominal aorta: Mid to distal aorta is not seen. No aneurysmal dilation of the proximal aorta. Other findings: None. IMPRESSION: Limited visualization due to ongoing small bowel obstruction. Cholelithiasis without evidence of acute cholecystitis. Bilateral moderate hydronephrosis. Electronically Signed   By: Fidela Salisbury M.D.   On: 08/29/2017 10:33   Ct Abdomen Pelvis W Contrast  Result Date: 08/28/2017 CLINICAL DATA:  Decreased urine output. Small bowel obstruction. History of prostatectomy and colostomy in 1998 EXAM: CT ABDOMEN AND PELVIS WITH CONTRAST TECHNIQUE: Multidetector CT imaging of the abdomen and pelvis was performed using the standard protocol following bolus administration of intravenous contrast. CONTRAST:  146m ISOVUE-300 IOPAMIDOL (ISOVUE-300) INJECTION 61% COMPARISON:  None. FINDINGS: Lower chest: Small left pleural effusion. Left basilar atelectasis. Mild right basilar atelectasis. Elevation of the right diaphragm.  Hepatobiliary: No focal hepatic mass. Dystrophic calcification in the right hepatic lobe adjacent to the middle portal vein. Normal gallbladder. No intrahepatic or extrahepatic biliary ductal dilatation. Pancreas: Unremarkable. No pancreatic ductal dilatation or surrounding inflammatory changes. Spleen: Normal in size without focal abnormality. Adrenals/Urinary Tract: Adrenal glands are unremarkable. Kidneys are normal, without renal calculi, focal lesion, or hydronephrosis. Multiple bladder calculi are noted. Stomach/Bowel: Severe small bowel dilatation measuring up to 5 cm in diameter with multiple air-fluid levels with a relative transition zone where there appears to be tethering in the lower abdomen/pelvis likely secondary to adhesions. Distal small bowel is decompressed. Colon is decompressed. No pneumatosis, pneumoperitoneum or portal venous gas. Left lower quadrant colostomy. Vascular/Lymphatic: Abdominal aortic atherosclerosis. Normal caliber abdominal aorta. No lymphadenopathy. Reproductive: Prior prostatectomy. Other: Left inguinal fat containing hernia. 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 Presacral soft tissue prominence likely related to posttreatment changes. Musculoskeletal: No acute osseous abnormality. No aggressive osseous lesion. Ankylosis of bilateral sacroiliac joints. Degenerative disc disease with disc height loss at L3-4, L4-5  and L5-S1 with bilateral facet arthropathy. IMPRESSION: 1. Severe small bowel obstruction with a relative transition zone in the lower abdomen/pelvis likely secondary to adhesions. Small bowel measures up to 5 cm in diameter. 2.  Aortic Atherosclerosis (ICD10-I70.0). 3. 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 4.  Left lower quadrant colostomy. 5. Multiple bladder calculi. 6. Small left pleural effusion. Electronically Signed   By: Kathreen Devoid   On: 08/28/2017 12:12    Portable Chest X-ray  Result Date: 08/30/2017 CLINICAL DATA:  84 year old male status post intubation. EXAM: PORTABLE CHEST 1 VIEW COMPARISON:  Chest radiograph dated 08/29/2017. FINDINGS: There has been interval placement of an endotracheal tube with tip approximately at the level of the carina. Recommend retraction of the tube by approximately 3-4 cm. Enteric tube extends below the diaphragm with side port in the left upper abdomen and tip beyond the inferior margin of the image. There is shallow inspiration with bibasilar atelectatic changes. A small left pleural effusion may be present. Pneumonia is not excluded. There is no pneumothorax. Stable mild cardiomegaly. No acute osseous pathology. IMPRESSION: 1. Endotracheal tube at the level of the carina. Recommend retraction by approximately 3-4 cm for optimal positioning. 2. Shallow inspiration with bibasilar atelectasis. Possible small left pleural effusion. These results were called by telephone at the time of interpretation on 08/30/2017 at 2:03 am to Dr. Olevia Bowens, who verbally acknowledged these results. Electronically Signed   By: Anner Crete M.D.   On: 08/30/2017 02:17   Dg Chest Port 1 View  Result Date: 08/29/2017 CLINICAL DATA:  NG tube placement EXAM: PORTABLE CHEST 1 VIEW COMPARISON:  08/29/2017 FINDINGS: Cardiomegaly with vascular congestion. Small left effusion and patchy basilar atelectasis or infiltrate on the left. Esophageal tube tip extends below diaphragm but tip is incompletely included. IMPRESSION: 1. Esophageal tube tip is below the diaphragm but incompletely included on the current image. 2. Cardiomegaly with vascular congestion. Patchy atelectasis or infiltrate at the left base Electronically Signed   By: Donavan Foil M.D.   On: 08/29/2017 23:26   Dg Chest Port 1 View  Result Date: 08/29/2017 CLINICAL DATA:  Sudden onset shortness of breath EXAM: PORTABLE CHEST 1 VIEW COMPARISON:  08/24/2017, 08/20/2017 FINDINGS:  Cardiomegaly with vascular congestion and mild interstitial opacities suggesting edema. Patchy atelectasis or infiltrate at the left base. No pneumothorax. IMPRESSION: 1. Cardiomegaly with vascular congestion and mild interstitial opacity suggesting minimal edema. 2. Patchy atelectasis versus minimal infiltrate at the left base. Electronically Signed   By: Donavan Foil M.D.   On: 08/29/2017 23:16   Dg Abd Acute W/chest  Result Date: 08/24/2017 CLINICAL DATA:  Nausea, vomiting, abdominal pain EXAM: DG ABDOMEN ACUTE W/ 1V CHEST COMPARISON:  08/20/2017 FINDINGS: Cardiomegaly.  Bibasilar atelectasis.  No effusions. Mildly prominent small bowel loops in the mid abdomen. Gas is noted within decompressed colon. Cannot exclude partial small bowel obstruction. No free air organomegaly. IMPRESSION: Dilated small bowel loops in the mid abdomen. Cannot exclude partial small bowel obstruction. Cardiomegaly, bibasilar atelectasis. Electronically Signed   By: Rolm Baptise M.D.   On: 08/24/2017 14:53   Dg Abd Portable 2v  Result Date: 08/29/2017 CLINICAL DATA:  Shortness of breath, history of colon cancer EXAM: PORTABLE ABDOMEN - 2 VIEW COMPARISON:  CT abdomen 08/28/2017 FINDINGS: There is persistent severe dilatation of the small bowel. There is no evidence of pneumoperitoneum, portal venous gas or pneumatosis. There are no pathologic calcifications along the expected course of the ureters. There is contrast in the  bladder from recent IV contrast injection. There is stable cardiomegaly. There elevation of the right diaphragm. The osseous structures are unremarkable. IMPRESSION: Persistent small-bowel obstruction. Electronically Signed   By: Kathreen Devoid   On: 08/29/2017 09:47    Orson Eva, DO  [facility] Pager [phone]  If 7PM-7AM, please contact night-coverage [email or web address] Password [email or web address] 08/30/2017, 9:37 AM   LOS: 2 days

## 2017-08-30 NOTE — Consult Note (Signed)
Consult: Bilateral hydronephrosis Requested by: Dr. Shanon Brow Tat  History of Present Illness: 84 year old male admitted with 2 days ago with partial small bowel obstruction.  CT scan was done with IV contrast which showed right greater than left mild hydroureteronephrosis with a distended bladder.  He also had some bladder stones.  At that time his creatinine was up slightly at 1.21.  His creatinine rose yesterday to 1.6 and a renal ultrasound was done.  This again showed mild-mod right greater than left hydronephrosis (I thought the hydro looked similar to CT which was not called hydro).  The bladder was not imaged but according to his nurse tonight, he still had a condom catheter. The patient decompensated early this morning with tachypnea with hypoxia and tachycardia and had to be intubated. He then had a Foley catheter placed and per nurse she drained about 650 ml. His Cr rose this morning although he continued to remain hypotensive. I reviewed the chart, labs and images this morning and discussed with Dr. Carles Collet around 10 AM that the bilateral hydronephrosis was likely from retention and a distended bladder and I did not believe stents or nephrostomy tubes would improve his renal fxn or overall situation. He has continued throughout the day with hypotension, tachycardia and worsening tachypnea. His UOP has been low.    Past Medical History:  Diagnosis Date  . At risk for falls   . Atrial fibrillation ([place])   . Chronic anticoagulation 2002  . Colon carcinoma ([place]) 1998   s/p partial colectomy and permanent colostomyin 1998  . Colostomy in place [facility])   . Glaucoma   . Glaucoma   . Hypertension   . Nontraumatic subarachnoid hemorrhage ([place])   . Overweight(278.02)   . Paroxysmal atrial fibrillation ([place]) 2002   Single episode by history during a noncardiac illness  . UTI (urinary tract infection)    Past Surgical History:  Procedure Laterality Date  . ABDOMINOPERINEAL PROCTOCOLECTOMY  1998    colon carcinoma; permanent colostomy  . CATARACT EXTRACTION     Bilateral  . COLONOSCOPY  11/2007   Dr. Juanda Crumble [facility], via ostomy. unremarkable.    Home Medications:  Medications Prior to Admission  Medication Sig Dispense Refill Last Dose  . diltiazem (CARDIZEM CD) 240 MG 24 hr capsule Take 1 capsule (240 mg total) by mouth daily.   08/25/2017 at 0900  . latanoprost (XALATAN) 0.005 % ophthalmic solution Place 1 drop into both eyes at bedtime.    08/27/2017 at 1931  . pantoprazole (PROTONIX) 40 MG tablet Take 40 mg by mouth daily.   08/27/2017 at 0900  . timolol (BETIMOL) 0.25 % ophthalmic solution Place 1 drop into the left eye 2 (two) times daily.    08/28/2017 at 0927  . ciprofloxacin (CIPRO) 500 MG tablet Take 500 mg by mouth 2 (two) times daily.   Completed Course at Unknown time   Allergies: No Known Allergies  Family History  Problem Relation Age of Onset  . Cancer Mother        gallbladder  . Uterine cancer Maternal Aunt   . Pancreatic cancer Brother   . Liver cancer Sister   . Other Brother        amylodosis  . Kidney cancer Sister    Social History:  reports that  has never smoked. he has never used smokeless tobacco. He reports that he does not drink alcohol or use drugs.  ROS: A complete review of systems was performed.  All systems are  negative except for pertinent findings as noted. Review of Systems  Unable to perform ROS: Intubated     Physical Exam:  Vital signs in last 24 hours: Temp:  [98.2 F (36.8 C)-99.9 F (37.7 C)] 98.4 F (36.9 C) (01/19 2000) Pulse Rate:  [38-144] 116 (01/19 2100) Resp:  [22-44] 32 (01/19 2100) BP: (57-110)/(41-89) 79/51 (01/19 2100) SpO2:  [83 %-100 %] 94 % (01/19 2100) FiO2 (%):  [100 %] 100 % (01/19 1550) Weight:  [92.9 kg (204 lb 12.9 oz)] 92.9 kg (204 lb 12.9 oz) (01/19 0545)    Intake/Output Summary (Last 24 hours) at 08/30/2017 2154 Last data filed at 08/30/2017 2129 Gross per 24 hour   Intake 1458.33 ml  Output 2050 ml  Net -591.67 ml    General:  Intubated Cardiovascular: tachycardic Lungs: tachypnic Abdomen: Soft, nontender, nondistended, no abdominal masses Extremities: edematous Neurologic: comatose GU: foley in place, brown urine  Laboratory Data:  Results for orders placed or performed during the hospital encounter of 08/28/17 (from the past 24 hour(s))  Draw ABG 1 hour after initiation of ventilator     Status: Abnormal   Collection Time: 08/30/17  2:00 AM  Result Value Ref Range   FIO2 100.00    Delivery systems VENTILATOR    Mode PRESSURE REGULATED VOLUME CONTROL    VT 500 mL   LHR 20 resp/min   Peep/cpap 5.0 cm H20   pH, Arterial 7.269 (L) 7.350 - 7.450   pCO2 arterial 40.8 32.0 - 48.0 mmHg   pO2, Arterial 71.6 (L) 83.0 - 108.0 mmHg   Bicarbonate 17.9 (L) 20.0 - 28.0 mmol/L   Acid-base deficit 7.6 (H) 0.0 - 2.0 mmol/L   O2 Saturation 90.4 %   Collection site BRACHIAL ARTERY    Drawn by [id number]    Sample type ARTERIAL    Mechanical Rate 20   Basic metabolic panel     Status: Abnormal   Collection Time: 08/30/17  4:37 AM  Result Value Ref Range   Sodium 148 (H) 135 - 145 mmol/L   Potassium 4.5 3.5 - 5.1 mmol/L   Chloride 115 (H) 101 - 111 mmol/L   CO2 20 (L) 22 - 32 mmol/L   Glucose, Bld 85 65 - 99 mg/dL   BUN 37 (H) 6 - 20 mg/dL   Creatinine, Ser 2.23 (H) 0.61 - 1.24 mg/dL   Calcium 8.5 (L) 8.9 - 10.3 mg/dL   GFR calc non Af Amer 25 (L) >60 mL/min   GFR calc Af Amer 29 (L) >60 mL/min   Anion gap 13 5 - 15  CBC     Status: Abnormal   Collection Time: 08/30/17  4:37 AM  Result Value Ref Range   WBC 30.9 (H) 4.0 - 10.5 K/uL   RBC 4.74 4.22 - 5.81 MIL/uL   Hemoglobin 15.5 13.0 - 17.0 g/dL   HCT 48.5 39.0 - 52.0 %   MCV 102.3 (H) 78.0 - 100.0 fL   MCH 32.7 26.0 - 34.0 pg   MCHC 32.0 30.0 - 36.0 g/dL   RDW 13.9 11.5 - 15.5 %   Platelets 193 150 - 400 K/uL  Blood gas, arterial     Status: Abnormal   Collection Time: 08/30/17  9:50 AM   Result Value Ref Range   FIO2 100.00    Delivery systems VENTILATOR    Mode PRESSURE REGULATED VOLUME CONTROL    VT 500 mL   LHR 20 resp/min   Peep/cpap 5.0 cm H20   pH,  Arterial 7.292 (L) 7.350 - 7.450   pCO2 arterial 34.7 32.0 - 48.0 mmHg   pO2, Arterial 56.2 (L) 83.0 - 108.0 mmHg   Bicarbonate 17.3 (L) 20.0 - 28.0 mmol/L   Acid-base deficit 9.0 (H) 0.0 - 2.0 mmol/L   O2 Saturation 95.2 %   Collection site BRACHIAL ARTERY    Drawn by [id number]    Sample type ARTERIAL    Allens test (pass/fail) NOT INDICATED (A) PASS  Culture, blood (Routine X 2) w Reflex to ID Panel     Status: None (Preliminary result)   Collection Time: 08/30/17 10:44 AM  Result Value Ref Range   Specimen Description LEFT ANTECUBITAL    Special Requests      BOTTLES DRAWN AEROBIC AND ANAEROBIC Blood Culture adequate volume   Culture  Setup Time PENDING    Culture NO GROWTH < 12 HOURS    Report Status PENDING   Procalcitonin - Baseline     Status: None   Collection Time: 08/30/17 10:44 AM  Result Value Ref Range   Procalcitonin 62.10 ng/mL  Culture, blood (Routine X 2) w Reflex to ID Panel     Status: None (Preliminary result)   Collection Time: 08/30/17 10:45 AM  Result Value Ref Range   Specimen Description BLOOD LEFT HAND    Special Requests      BOTTLES DRAWN AEROBIC AND ANAEROBIC Blood Culture adequate volume   Culture NO GROWTH < 12 HOURS    Report Status PENDING   Lactic acid, plasma     Status: Abnormal   Collection Time: 08/30/17 11:02 AM  Result Value Ref Range   Lactic Acid, Venous 3.8 (HH) 0.5 - 1.9 mmol/L  Lactic acid, plasma     Status: Abnormal   Collection Time: 08/30/17  3:07 PM  Result Value Ref Range   Lactic Acid, Venous 2.5 (HH) 0.5 - 1.9 mmol/L  Lactic acid, plasma     Status: Abnormal   Collection Time: 08/30/17  6:15 PM  Result Value Ref Range   Lactic Acid, Venous 2.1 (HH) 0.5 - 1.9 mmol/L   Recent Results (from the past 240 hour(s))  MRSA PCR Screening     Status:  Abnormal   Collection Time: 08/28/17  4:55 PM  Result Value Ref Range Status   MRSA by PCR POSITIVE (A) NEGATIVE Final    Comment: RESULT CALLED TO, READ BACK BY AND VERIFIED WITH: HOWERTON M. @ 1830 ON [id number]        The GeneXpert MRSA Assay (FDA approved for NASAL specimens only), is one component of a comprehensive MRSA colonization surveillance program. It is not intended to diagnose MRSA infection nor to guide or monitor treatment for MRSA infections.   Culture, Urine     Status: Abnormal   Collection Time: 08/29/17  7:26 AM  Result Value Ref Range Status   Specimen Description URINE, CLEAN CATCH  Final   Special Requests NONE  Final   Culture (A)  Final    <10,000 COLONIES/mL INSIGNIFICANT GROWTH Performed at [facility], [address], [place], [place] [address]    Report Status 08/30/2017 FINAL  Final  Culture, blood (Routine X 2) w Reflex to ID Panel     Status: None (Preliminary result)   Collection Time: 08/30/17 10:44 AM  Result Value Ref Range Status   Specimen Description LEFT ANTECUBITAL  Final   Special Requests   Final    BOTTLES DRAWN AEROBIC AND ANAEROBIC Blood Culture adequate volume   Culture  Setup Time PENDING  Incomplete   Culture NO GROWTH < 12 HOURS  Final   Report Status PENDING  Incomplete  Culture, blood (Routine X 2) w Reflex to ID Panel     Status: None (Preliminary result)   Collection Time: 08/30/17 10:45 AM  Result Value Ref Range Status   Specimen Description BLOOD LEFT HAND  Final   Special Requests   Final    BOTTLES DRAWN AEROBIC AND ANAEROBIC Blood Culture adequate volume   Culture NO GROWTH < 12 HOURS  Final   Report Status PENDING  Incomplete   Creatinine: Recent Labs    08/24/17 1514 08/25/17 0400 08/28/17 1009 08/29/17 0444 08/30/17 0437  CREATININE 0.80 0.79 1.21 1.60* 2.23*    Impression/Assessment/plan: - R>L hydro - likely from retention. Declining kidney function related to hypotension and overall pt  status. Prognosis is poor and stents or nephrostomy would not help and the risks of these procedure outweigh the benefit.   Festus Aloe 08/30/2017, 9:44 PM

## 2017-08-30 NOTE — Progress Notes (Signed)
CRITICAL VALUE ALERT  Critical Value:  Lactic Acid 2.5  Date & Time Notied: 08/30/17  Provider Notified: TAT  Orders Received/Actions taken:

## 2017-08-30 NOTE — Progress Notes (Addendum)
Reevaluated patient around 1150 -Patient remains hypotensive despite Neo-Synephrine at 30 mcg/kg/on -No distress on ventilator -Heart rate 100-110 -Discontinue diltiazem drip -Lactic acid 3.8 -We will bolus another liter of NS -Repeat lactic acid in 3 hours -Increase Neo-Synephrine titrate for SBP greater than 90;  -PICC line previously ordered -Called to update patient's sister--> changed patient to DNR which she agrees -discussed with Dr. Junious Silk hydronephrosis may be related to the patient's bladder distention and urinary retention Monitor clinically-after Foley insertion  DTat

## 2017-08-30 NOTE — Progress Notes (Signed)
eLink Physician-Brief Progress Note Patient Name: Joel Bishop DOB: 28-Jun-1928 MRN: [id number]   Date of Service  08/30/2017  HPI/Events of Note  hypotensive  eICU Interventions  Check lactate 1L bolus Start neo gtt if BP remains low     Intervention Category Major Interventions: Hypotension - evaluation and management  Rakesh V. Alva 08/30/2017, 6:08 AM

## 2017-08-31 ENCOUNTER — Inpatient Hospital Stay (HOSPITAL_COMMUNITY): Payer: Medicare Other

## 2017-08-31 DIAGNOSIS — Z789 Other specified health status: Secondary | ICD-10-CM

## 2017-08-31 DIAGNOSIS — I4891 Unspecified atrial fibrillation: Secondary | ICD-10-CM

## 2017-08-31 DIAGNOSIS — N1330 Unspecified hydronephrosis: Secondary | ICD-10-CM

## 2017-08-31 DIAGNOSIS — J9600 Acute respiratory failure, unspecified whether with hypoxia or hypercapnia: Secondary | ICD-10-CM

## 2017-08-31 DIAGNOSIS — E875 Hyperkalemia: Secondary | ICD-10-CM

## 2017-08-31 LAB — BLOOD GAS, ARTERIAL
ACID-BASE DEFICIT: 13.9 mmol/L — AB (ref 0.0–2.0)
Acid-base deficit: 10.2 mmol/L — ABNORMAL HIGH (ref 0.0–2.0)
BICARBONATE: 15.9 mmol/L — AB (ref 20.0–28.0)
Bicarbonate: 12.6 mmol/L — ABNORMAL LOW (ref 20.0–28.0)
DRAWN BY: 105551
DRAWN BY: 105551
FIO2: 100
FIO2: 65
LHR: 34 {breaths}/min
MECHANICAL RATE: 34
MECHVT: 390 mL
MECHVT: 500 mL
O2 SAT: 94 %
O2 Saturation: 95.3 %
PCO2 ART: 41.6 mmHg (ref 32.0–48.0)
PCO2 ART: 51.5 mmHg — AB (ref 32.0–48.0)
PEEP: 10 cmH2O
PEEP: 10 cmH2O
PH ART: 7.074 — AB (ref 7.350–7.450)
PO2 ART: 82.6 mmHg — AB (ref 83.0–108.0)
RATE: 34 resp/min
pH, Arterial: 7.215 — ABNORMAL LOW (ref 7.350–7.450)
pO2, Arterial: 93.9 mmHg (ref 83.0–108.0)

## 2017-08-31 LAB — CBC WITH DIFFERENTIAL/PLATELET
BASOS ABS: 0 10*3/uL (ref 0.0–0.1)
Basophils Relative: 0 %
EOS ABS: 0 10*3/uL (ref 0.0–0.7)
Eosinophils Relative: 0 %
HEMATOCRIT: 42.9 % (ref 39.0–52.0)
Hemoglobin: 12.9 g/dL — ABNORMAL LOW (ref 13.0–17.0)
LYMPHS ABS: 1 10*3/uL (ref 0.7–4.0)
Lymphocytes Relative: 3 %
MCH: 31.8 pg (ref 26.0–34.0)
MCHC: 30.1 g/dL (ref 30.0–36.0)
MCV: 105.7 fL — ABNORMAL HIGH (ref 78.0–100.0)
Monocytes Absolute: 1 10*3/uL (ref 0.1–1.0)
Monocytes Relative: 3 %
NEUTROS ABS: 32.3 10*3/uL — AB (ref 1.7–7.7)
Neutrophils Relative %: 94 %
Platelets: 165 10*3/uL (ref 150–400)
RBC: 4.06 MIL/uL — ABNORMAL LOW (ref 4.22–5.81)
RDW: 14.7 % (ref 11.5–15.5)
WBC: 34.3 10*3/uL — ABNORMAL HIGH (ref 4.0–10.5)

## 2017-08-31 LAB — COMPREHENSIVE METABOLIC PANEL
ALBUMIN: 2.5 g/dL — AB (ref 3.5–5.0)
ALT: 30 U/L (ref 17–63)
ANION GAP: 11 (ref 5–15)
AST: 44 U/L — ABNORMAL HIGH (ref 15–41)
Alkaline Phosphatase: 73 U/L (ref 38–126)
BUN: 41 mg/dL — ABNORMAL HIGH (ref 6–20)
CO2: 16 mmol/L — ABNORMAL LOW (ref 22–32)
Calcium: 8 mg/dL — ABNORMAL LOW (ref 8.9–10.3)
Chloride: 124 mmol/L — ABNORMAL HIGH (ref 101–111)
Creatinine, Ser: 2.68 mg/dL — ABNORMAL HIGH (ref 0.61–1.24)
GFR calc non Af Amer: 20 mL/min — ABNORMAL LOW (ref >60)
GFR, EST AFRICAN AMERICAN: 23 mL/min — AB (ref >60)
GLUCOSE: 81 mg/dL (ref 65–99)
POTASSIUM: 6.3 mmol/L — AB (ref 3.5–5.1)
SODIUM: 151 mmol/L — AB (ref 135–145)
TOTAL PROTEIN: 6.1 g/dL — AB (ref 6.5–8.1)
Total Bilirubin: 2.9 mg/dL — ABNORMAL HIGH (ref 0.3–1.2)

## 2017-08-31 LAB — BASIC METABOLIC PANEL
Anion gap: 18 — ABNORMAL HIGH (ref 5–15)
BUN: 38 mg/dL — AB (ref 6–20)
CHLORIDE: 95 mmol/L — AB (ref 101–111)
CO2: 34 mmol/L — AB (ref 22–32)
Calcium: 6 mg/dL — CL (ref 8.9–10.3)
Creatinine, Ser: 2.16 mg/dL — ABNORMAL HIGH (ref 0.61–1.24)
GFR calc Af Amer: 30 mL/min — ABNORMAL LOW (ref >60)
GFR calc non Af Amer: 26 mL/min — ABNORMAL LOW (ref >60)
GLUCOSE: 220 mg/dL — AB (ref 65–99)
Potassium: 3.7 mmol/L (ref 3.5–5.1)
SODIUM: 147 mmol/L — AB (ref 135–145)

## 2017-08-31 LAB — MAGNESIUM
MAGNESIUM: 1.5 mg/dL — AB (ref 1.7–2.4)
Magnesium: 2.1 mg/dL (ref 1.7–2.4)

## 2017-08-31 LAB — GLUCOSE, CAPILLARY
GLUCOSE-CAPILLARY: 91 mg/dL (ref 65–99)
GLUCOSE-CAPILLARY: 121 mg/dL — AB (ref 65–99)
GLUCOSE-CAPILLARY: 119 mg/dL — AB (ref 65–99)
Glucose-Capillary: 144 mg/dL — ABNORMAL HIGH (ref 65–99)
Glucose-Capillary: 83 mg/dL (ref 65–99)
Glucose-Capillary: 94 mg/dL (ref 65–99)

## 2017-08-31 LAB — PHOSPHORUS: PHOSPHORUS: 7 mg/dL — AB (ref 2.5–4.6)

## 2017-08-31 LAB — LACTIC ACID, PLASMA
LACTIC ACID, VENOUS: 2.8 mmol/L — AB (ref 0.5–1.9)
LACTIC ACID, VENOUS: 2.9 mmol/L — AB (ref 0.5–1.9)

## 2017-08-31 LAB — VANCOMYCIN, RANDOM: Vancomycin Rm: 12

## 2017-08-31 LAB — PROCALCITONIN: Procalcitonin: 64.58 ng/mL

## 2017-08-31 MED ORDER — DEXTROSE 50 % IV SOLN
25.0000 g | Freq: Once | INTRAVENOUS | Status: AC
  Administered 2017-08-31: 25 g via INTRAVENOUS
  Filled 2017-08-31: qty 50

## 2017-08-31 MED ORDER — SODIUM BICARBONATE 8.4 % IV SOLN
INTRAVENOUS | Status: AC
  Filled 2017-08-31: qty 150

## 2017-08-31 MED ORDER — DEXTROSE-NACL 5-0.45 % IV SOLN
INTRAVENOUS | Status: DC
  Administered 2017-08-31: 1 mL via INTRAVENOUS
  Administered 2017-09-01 (×2): via INTRAVENOUS

## 2017-08-31 MED ORDER — SODIUM CHLORIDE 0.9 % IV SOLN
2.0000 g | Freq: Once | INTRAVENOUS | Status: AC
  Administered 2017-08-31: 2 g via INTRAVENOUS
  Filled 2017-08-31: qty 20

## 2017-08-31 MED ORDER — STERILE WATER FOR INJECTION IV SOLN
INTRAVENOUS | Status: DC
  Administered 2017-08-31 (×2): via INTRAVENOUS
  Filled 2017-08-31 (×4): qty 850

## 2017-08-31 MED ORDER — SODIUM CHLORIDE 0.9 % IV SOLN
INTRAVENOUS | Status: DC
  Administered 2017-08-31: 05:00:00 via INTRAVENOUS

## 2017-08-31 MED ORDER — DEXTROSE 5 % IV SOLN
INTRAVENOUS | Status: DC
  Administered 2017-08-31: 1 mL via INTRAVENOUS

## 2017-08-31 MED ORDER — PANTOPRAZOLE SODIUM 40 MG IV SOLR
40.0000 mg | Freq: Two times a day (BID) | INTRAVENOUS | Status: DC
  Administered 2017-08-31 – 2017-09-01 (×3): 40 mg via INTRAVENOUS
  Filled 2017-08-31 (×3): qty 40

## 2017-08-31 MED ORDER — SODIUM CHLORIDE 0.9% FLUSH
10.0000 mL | INTRAVENOUS | Status: DC | PRN
  Administered 2017-09-01: 10 mL
  Filled 2017-08-31: qty 40

## 2017-08-31 MED ORDER — CHLORHEXIDINE GLUCONATE CLOTH 2 % EX PADS: 6.0000 | MEDICATED_PAD | Freq: Every day | CUTANEOUS | Status: DC

## 2017-08-31 MED ORDER — MAGNESIUM SULFATE 2 GM/50ML IV SOLN
2.0000 g | Freq: Once | INTRAVENOUS | Status: AC
  Administered 2017-08-31: 2 g via INTRAVENOUS
  Filled 2017-08-31: qty 50

## 2017-08-31 MED ORDER — CHLORHEXIDINE GLUCONATE CLOTH 2 % EX PADS
6.0000 | MEDICATED_PAD | Freq: Every day | CUTANEOUS | Status: DC
  Administered 2017-09-01: 6 via TOPICAL

## 2017-08-31 MED ORDER — PHENYLEPHRINE HCL 10 MG/ML IJ SOLN
INTRAMUSCULAR | Status: AC
Start: 2017-08-31 — End: ?
  Filled 2017-08-31: qty 4

## 2017-08-31 MED ORDER — SODIUM BICARBONATE 8.4 % IV SOLN
50.0000 meq | Freq: Once | INTRAVENOUS | Status: AC
  Administered 2017-08-31: 50 meq via INTRAVENOUS
  Filled 2017-08-31: qty 50

## 2017-08-31 MED ORDER — SODIUM CHLORIDE 0.9 % IV SOLN
1.0000 g | Freq: Once | INTRAVENOUS | Status: AC
  Administered 2017-08-31: 1 g via INTRAVENOUS
  Filled 2017-08-31: qty 10

## 2017-08-31 MED ORDER — SODIUM CHLORIDE 0.9% FLUSH
10.0000 mL | Freq: Two times a day (BID) | INTRAVENOUS | Status: DC
  Administered 2017-08-31 – 2017-09-01 (×2): 10 mL

## 2017-08-31 MED ORDER — INSULIN ASPART 100 UNIT/ML IV SOLN
10.0000 [IU] | Freq: Once | INTRAVENOUS | Status: AC
  Administered 2017-08-31: 10 [IU] via INTRAVENOUS

## 2017-08-31 MED ORDER — VANCOMYCIN HCL IN DEXTROSE 1-5 GM/200ML-% IV SOLN
1000.0000 mg | INTRAVENOUS | Status: DC
  Administered 2017-08-31: 1000 mg via INTRAVENOUS
  Filled 2017-08-31: qty 200

## 2017-08-31 NOTE — Progress Notes (Signed)
Talked to Dr. Olevia Bowens about patient Joel Bishop , will try increasing Vt . Bicarb drip is suggested not nuch more we will be able to do for patient. Have increased back to 500 , removed some dead space . Oxygen being decreased.

## 2017-08-31 NOTE — Progress Notes (Signed)
PROGRESS NOTE  Joel Bishop YPP:[id number] DOB: 12-01-1927 DOA: 08/28/2017 PCP: Rosita Fire, MD  Brief History: 84 year old male with a history of colon cancer status post colostomy 1998, paroxysmal atrial fibrillation, hypertension, and recent subarachnoid hemorrhage presenting with nearly 1 week history of intermittent abdominal fullness. The patient was recently admitted to the hospital from 08/20/17 through 08/23/17 secondary to a subarachnoid hemorrhage resulting from a fall at home. As result, the patient was taken off of his warfarin after INR reversal. The patient also was treated for UTI with ciprofloxacin which she finished on 08/26/2017. He presented to the emergency department 08/24/2017 with nausea, vomiting, and abdominal fullness. Abdominal x-ray Suggests inability to rule out underlying bowel obstruction but since Patient's symptoms had resolved in the ED aith normal abdominal exam and Patient tolerating diet without any problem he was discharged back to SNF. He was then seen by physician at the facility next day and again at that time he did not have any complaint and was tolerating diet without a problem.Since then, the patient has had intermittent abdominal fullness and nausea. On the morning of 08/28/2017, the patient had worsening abdominal pain associated with nausea and vomiting. He also reported low colostomy output times 2 days. CT of the abdomen and pelvis in the emergency department showed small bowel dilatation up to 5 cm with air-fluid levels with a transition zone in the lower abdomen and pelvis. In the evening of 08/29/2017, the patient had an episode of emesis after which he  developed respiratory distress.  He subsequently required intubation.  He subsequently became hypotensive requiring aggressive fluid resuscitation.  The patient was subsequently placed on Neo-Synephrine which required a continued upward titration to maintain SBP greater than 90.  His  antibiotic coverage was broadened to include vancomycin and Zosyn.  Pulmonary medicine was consulted to assist with vent management.    Assessment/Plan: ID Sepsis -Secondary to aspiration pneumonia, but cannot rule out bacteremia -concerned about ischemic bowel  -Check procalcitonin--64.58 -Lactic acid peaked 3.8 -Urinalysis negative for pyuria -1/20-Personally reviewed chest x-ray--vague LLL opacity -Continue vancomycin and Zosyn pending culture data -remains hypotensive with phenylephrine at 90 mcg/kg/min -blood cultures neg  Aspiration pneumonia -Zosyn as discussed  PULM Acute respiratory failure with hypoxia -Secondary to aspiration pneumonia -Intubated and mechanically ventilated -FiO2 at 65%, AC -Appreciate Dr. Luan Pulling consult -Wean to extubation as tolerated -Personally reviewed chest x-ray--vague LLL opacity -am CXR  GI Small bowel obstruction -Continue IV fluids -Remain n.p.o. -General surgery consult--not a surgical candiate -+stool in ostomy -Continue bowel rest -concerned about ischemic bowel given increased abd pain, but remains very poor surgical candidate  Blood in NG -increase PPI to bid -not a candidate for endoscopy  Cholelithiasis -abd US--GB neck stone -discussed with Dr. Blanchie Serve a surgical candidate, no sign of cholecystitis or cholangitis -continue zosyn  CARD Atrial fibrillation with RVR -d/c diltiazem due to hypotension -continue amiodaron -HR 120-130 -Warfarin discontinued recently secondary to subarachnoid hemorrhage  RENAL AKI/Hydronephrosis -Likely secondary to volume depletion/ATN/Sepsis -Continue IV fluids switch to 1/2NS -Abdominal ultrasound--bilateral moderate hydronephrosis; cholelithiasis with GB neck stone without GB wall thickening or ductal dilatation -hydronephrosis likely due to urine retention -appreciate urology consult -discussed with urology, Dr. Jillene Bucks candidate for stents or nephrostomy  tubes--risks outweigh benefit -renal function continues to worsen   FEN Metabolic Acidosis -due to worsening renal failure -concerned about bowel ischemia -unstable presently to perform CT abd -continue bicarbonate drip  Hypernatremia -start D5W  Hyperkalemia -  treated -repeat BMP  Essential hypertension -now hypotensive  NEURO Recent subarachnoid hemorrhage -Anticoagulation discontinued indefinitely  GOC -DNR -overall poor prognosis   Disposition Plan:critically ill--remain in ICU Family Communication:Sister (POA) updated--DNR  Consultants:General surgery, PULM  Code Status: DNR   DVT Prophylaxis: SCDs   Procedures: As Listed in Progress Note Above  Antibiotics: Zosyn 1/19>> vanco 1/19>>  The patient is critically ill with multiple organ systems failure and requires high complexity decision making for assessment and support, frequent evaluation and titration of therapies, application of advanced monitoring technologies and extensive interpretation of multiple databases.  Critical care time - 45 mins.        Subjective: Patient is intubated, mechanically ventilated, and sedated.  No respiratory distress.  No vomiting, uncontrolled pain.  He has some abdominal pain, but there is stool in the ostomy.  Objective: Vitals:   08/31/17 0445 08/31/17 0500 08/31/17 0738 08/31/17 0745  BP: 100/65 108/64  (!) 105/41  Pulse: (!) 121 (!) 122  (!) 126  Resp: (!) 34 (!) 35  (!) 33  Temp:   98.4 F (36.9 C)   TempSrc:   Axillary   SpO2: 97% 98%  99%  Weight:  100.7 kg (222 lb 0.1 oz)    Height:        Intake/Output Summary (Last 24 hours) at 08/31/2017 0813 Last data filed at 08/31/2017 0600 Gross per 24 hour  Intake 3480.52 ml  Output 750 ml  Net 2730.52 ml   Weight change: 7.8 kg (17 lb 3.1 oz) Exam:   General:  Pt is alert, follows commands appropriately, not in acute distress  HEENT: No icterus, No thrush, No neck mass,  [place]/AT  Cardiovascular: IRRR, S1/S2, no rubs, no gallops  Respiratory: Diminished breath sounds at the bases.  Bilateral scattered crackles.  No wheezing.  Abdomen: Soft/+BS, non tender, non distended, no guarding  Extremities: 2 + LE edema, No lymphangitis, No petechiae, No rashes, no synovitis   Data Reviewed: I have personally reviewed following labs and imaging studies Basic Metabolic Panel: Recent Labs  Lab 08/25/17 0400 08/28/17 1009 08/28/17 1014 08/29/17 0444 08/30/17 0437 08/31/17 0121  NA 136 138  --  144 148* 151*  K 4.1 3.3*  --  4.5 4.5 6.3*  CL 99* 101  --  111 115* 124*  CO2 23 26  --  24 20* 16*  GLUCOSE 115* 110*  --  98 85 81  BUN 16 20  --  25* 37* 41*  CREATININE 0.79 1.21  --  1.60* 2.23* 2.68*  CALCIUM 8.9 9.3  --  8.8* 8.5* 8.0*  MG  --   --  2.4  --   --  2.1  PHOS  --   --   --   --   --  7.0*   Liver Function Tests: Recent Labs  Lab 08/28/17 1009 08/31/17 0121  AST 57* 44*  ALT 52 30  ALKPHOS 116 73  BILITOT 1.2 2.9*  PROT 8.3* 6.1*  ALBUMIN 3.2* 2.5*   Recent Labs  Lab 08/28/17 1009  LIPASE 22   No results for input(s): AMMONIA in the last 168 hours. Coagulation Profile: No results for input(s): INR, PROTIME in the last 168 hours. CBC: Recent Labs  Lab 08/25/17 0400 08/28/17 1009 08/29/17 0444 08/30/17 0437 08/31/17 0121  WBC 13.0* 19.1* 19.3* 30.9* 34.3*  NEUTROABS 10.9* 15.4*  --   --  32.3*  HGB 16.3 14.8 14.5 15.5 12.9*  HCT 47.7 44.9 45.8 48.5 42.9  MCV 96.2 96.8 99.6 102.3* 105.7*  PLT 276 270 266 193 165   Cardiac Enzymes: No results for input(s): CKTOTAL, CKMB, CKMBINDEX, TROPONINI in the last 168 hours. BNP: Invalid input(s): POCBNP CBG: Recent Labs  Lab 08/31/17 0429 08/31/17 0736  GLUCAP 91 83   HbA1C: No results for input(s): HGBA1C in the last 72 hours. Urine analysis:    Component Value Date/Time   COLORURINE YELLOW 08/29/2017 0726   APPEARANCEUR CLEAR 08/29/2017 0726   LABSPEC 1.017  08/29/2017 0726   PHURINE 5.0 08/29/2017 0726   GLUCOSEU NEGATIVE 08/29/2017 0726   HGBUR MODERATE (A) 08/29/2017 0726   BILIRUBINUR NEGATIVE 08/29/2017 0726   KETONESUR NEGATIVE 08/29/2017 0726   PROTEINUR 30 (A) 08/29/2017 0726   NITRITE NEGATIVE 08/29/2017 0726   LEUKOCYTESUR NEGATIVE 08/29/2017 0726   Sepsis Labs: _0 (procalcitonin:4,lacticidven:4) ) Recent Results (from the past 240 hour(s))  MRSA PCR Screening     Status: Abnormal   Collection Time: 08/28/17  4:55 PM  Result Value Ref Range Status   MRSA by PCR POSITIVE (A) NEGATIVE Final    Comment: RESULT CALLED TO, READ BACK BY AND VERIFIED WITH: HOWERTON M. @ 1660 ON [id number]        The GeneXpert MRSA Assay (FDA approved for NASAL specimens only), is one component of a comprehensive MRSA colonization surveillance program. It is not intended to diagnose MRSA infection nor to guide or monitor treatment for MRSA infections.   Culture, Urine     Status: Abnormal   Collection Time: 08/29/17  7:26 AM  Result Value Ref Range Status   Specimen Description URINE, CLEAN CATCH  Final   Special Requests NONE  Final   Culture (A)  Final    <10,000 COLONIES/mL INSIGNIFICANT GROWTH Performed at [facility], [address] [place], [place] [address]    Report Status 08/30/2017 FINAL  Final  Culture, blood (Routine X 2) w Reflex to ID Panel     Status: None (Preliminary result)   Collection Time: 08/30/17 10:44 AM  Result Value Ref Range Status   Specimen Description LEFT ANTECUBITAL  Final   Special Requests   Final    BOTTLES DRAWN AEROBIC AND ANAEROBIC Blood Culture adequate volume   Culture  Setup Time PENDING  Incomplete   Culture NO GROWTH < 24 HOURS  Final   Report Status PENDING  Incomplete  Culture, blood (Routine X 2) w Reflex to ID Panel     Status: None (Preliminary result)   Collection Time: 08/30/17 10:45 AM  Result Value Ref Range Status   Specimen Description BLOOD LEFT HAND  Final    Special Requests   Final    BOTTLES DRAWN AEROBIC AND ANAEROBIC Blood Culture adequate volume   Culture NO GROWTH < 24 HOURS  Final   Report Status PENDING  Incomplete     Scheduled Meds: . chlorhexidine gluconate (MEDLINE KIT)  15 mL Mouth Rinse BID  . Chlorhexidine Gluconate Cloth  6 each Topical Q0600  . hydrocortisone sod succinate (SOLU-CORTEF) inj  100 mg Intravenous Q8H  . latanoprost  1 drop Both Eyes QHS  . mouth rinse  15 mL Mouth Rinse QID  . mupirocin ointment  1 application Nasal BID  . pantoprazole (PROTONIX) IV  40 mg Intravenous Q24H  . sodium chloride flush  3 mL Intravenous Q12H  . timolol  1 drop Left Eye BID   Continuous Infusions: . sodium chloride 100 mL/hr at 08/31/17 0520  . amiodarone 30 mg/hr (08/31/17 0758)  .  phenylephrine (NEO-SYNEPHRINE) Adult infusion 80 mcg/min (08/31/17 0732)  . piperacillin-tazobactam (ZOSYN)  IV 3.375 g (08/31/17 0520)  .  sodium bicarbonate (isotonic) infusion in sterile water 100 mL/hr at 08/31/17 0205  . vancomycin      Procedures/Studies: Dg Chest 1 View  Result Date: 08/20/2017 CLINICAL DATA:  Generalized weakness EXAM: CHEST 1 VIEW COMPARISON:  None. FINDINGS: Cardiomegaly and vascular pedicle widening. Congested appearance of vessels without Kerley lines or effusion. No pneumothorax or air bronchogram. Artifact from EKG leads. IMPRESSION: Cardiomegaly and vascular congestion accentuated by low volumes. Electronically Signed   By: Monte Fantasia M.D.   On: 08/20/2017 20:04   Ct Head Wo Contrast  Result Date: 08/21/2017 CLINICAL DATA:  [place] yesterday, subarachnoid hemorrhage, followup EXAM: CT HEAD WITHOUT CONTRAST TECHNIQUE: Contiguous axial images were obtained from the base of the skull through the vertex without intravenous contrast. Sagittal and coronal MPR images reconstructed from axial data set. COMPARISON:  08/20/2017 FINDINGS: Brain: Generalized atrophy. Prominent ventricular system unchanged. No midline shift or mass  effect. Small vessel chronic ischemic changes of deep cerebral white matter. Again identified small amount of high attenuation acute subarachnoid hemorrhage at the interhemispheric fissure, unchanged. No new areas of intracranial hemorrhage, mass lesion, or acute infarction. No new extra-axial collections. Vascular: Atherosclerotic calcifications of internal carotid arteries at skull base Skull: Intact though demineralized Sinuses/Orbits: Clear Other: N/A IMPRESSION: Stable appearance of a small amount of acute subarachnoid hemorrhage at the interhemispheric fissure anteriorly. Atrophy with small vessel chronic ischemic changes of deep cerebral white matter. No new intracranial abnormalities. Electronically Signed   By: Lavonia Dana M.D.   On: 08/21/2017 16:20   Ct Head Wo Contrast  Result Date: 08/20/2017 CLINICAL DATA:  84 year old male with history of confusion today. Swelling in both legs. EXAM: CT HEAD WITHOUT CONTRAST TECHNIQUE: Contiguous axial images were obtained from the base of the skull through the vertex without intravenous contrast. COMPARISON:  No priors. FINDINGS: Brain: Small amount of high attenuation in the anterior aspect of the interhemispheric fissure best appreciated on axial images 20 and 21 and coronal images 25 through 29. Patchy and confluent areas of decreased attenuation are noted throughout the deep and periventricular white matter of the cerebral hemispheres bilaterally, compatible with chronic microvascular ischemic disease. Moderate to severe cerebral atrophy with associated ex vacuo dilatation of the ventricular system. No evidence of acute infarction, mass or mass effect. Vascular: No hyperdense vessel or unexpected calcification. Skull: Normal. Negative for fracture or focal lesion. Sinuses/Orbits: No acute finding. Other: None. IMPRESSION: 1. Small amount of subarachnoid hemorrhage noted in the anterior interhemispheric fissure. 2. Moderate to severe cerebral atrophy with  probable ex vacuo dilatation of the ventricular system. The possibility of normal pressure hydrocephalus (NPH) is not entirely excluded. 3. Extensive chronic microvascular ischemic changes throughout the cerebral white matter, as above. Critical Value/emergent results were called by telephone at the time of interpretation on 08/20/2017 at 8:23 pm to Dr. Nat Christen, who verbally acknowledged these results. Electronically Signed   By: Vinnie Langton M.D.   On: 08/20/2017 20:23   US Abdomen Complete  Result Date: 08/29/2017 CLINICAL DATA:  Elevated liver function tests. Acute renal insufficiency. EXAM: ABDOMEN ULTRASOUND COMPLETE COMPARISON:  Abdominal CT 08/28/2017 FINDINGS: Gallbladder: The gallbladder wall is normally distended. There is a calculus measuring 1.2 cm in the neck of the gallbladder without appreciable gallbladder wall thickening. The gallbladder wall measures 1.8 mm. No pericholecystic fluid. Common bile duct: Diameter: 6.2 mm Liver: No focal lesion identified.  Coarse parenchymal echogenicity. Portal vein is patent on color Doppler imaging with normal direction of blood flow towards the liver. IVC: Limited visualization without focal abnormality. Pancreas: Not seen. Spleen: Size and appearance within normal limits. Right Kidney: Length: 10.1 cm. Echogenicity within normal limits. Moderate hydronephrosis visualized. Left Kidney: Length: 11.8 cm. Echogenicity within normal limits. Moderate hydronephrosis visualized. Abdominal aorta: Mid to distal aorta is not seen. No aneurysmal dilation of the proximal aorta. Other findings: None. IMPRESSION: Limited visualization due to ongoing small bowel obstruction. Cholelithiasis without evidence of acute cholecystitis. Bilateral moderate hydronephrosis. Electronically Signed   By: Fidela Salisbury M.D.   On: 08/29/2017 10:33   Ct Abdomen Pelvis W Contrast  Result Date: 08/28/2017 CLINICAL DATA:  Decreased urine output. Small bowel obstruction. History  of prostatectomy and colostomy in 1998 EXAM: CT ABDOMEN AND PELVIS WITH CONTRAST TECHNIQUE: Multidetector CT imaging of the abdomen and pelvis was performed using the standard protocol following bolus administration of intravenous contrast. CONTRAST:  126m ISOVUE-300 IOPAMIDOL (ISOVUE-300) INJECTION 61% COMPARISON:  None. FINDINGS: Lower chest: Small left pleural effusion. Left basilar atelectasis. Mild right basilar atelectasis. Elevation of the right diaphragm. Hepatobiliary: No focal hepatic mass. Dystrophic calcification in the right hepatic lobe adjacent to the middle portal vein. Normal gallbladder. No intrahepatic or extrahepatic biliary ductal dilatation. Pancreas: Unremarkable. No pancreatic ductal dilatation or surrounding inflammatory changes. Spleen: Normal in size without focal abnormality. Adrenals/Urinary Tract: Adrenal glands are unremarkable. Kidneys are normal, without renal calculi, focal lesion, or hydronephrosis. Multiple bladder calculi are noted. Stomach/Bowel: Severe small bowel dilatation measuring up to 5 cm in diameter with multiple air-fluid levels with a relative transition zone where there appears to be tethering in the lower abdomen/pelvis likely secondary to adhesions. Distal small bowel is decompressed. Colon is decompressed. No pneumatosis, pneumoperitoneum or portal venous gas. Left lower quadrant colostomy. Vascular/Lymphatic: Abdominal aortic atherosclerosis. Normal caliber abdominal aorta. No lymphadenopathy. Reproductive: Prior prostatectomy. Other: Left inguinal fat containing hernia. 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 Presacral soft tissue prominence likely related to posttreatment changes. Musculoskeletal: No acute osseous abnormality. No aggressive osseous lesion. Ankylosis of bilateral sacroiliac joints. Degenerative disc disease with disc height loss at L3-4, L4-5 and L5-S1 with bilateral facet arthropathy. IMPRESSION: 1.  Severe small bowel obstruction with a relative transition zone in the lower abdomen/pelvis likely secondary to adhesions. Small bowel measures up to 5 cm in diameter. 2.  Aortic Atherosclerosis (ICD10-I70.0). 3. 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 4.  Left lower quadrant colostomy. 5. Multiple bladder calculi. 6. Small left pleural effusion. Electronically Signed   By: HKathreen Devoid  On: 08/28/2017 12:12   Dg Chest 1v Repeat Same Day  Result Date: 08/30/2017 CLINICAL DATA:  PICC line placement. EXAM: CHEST - 1 VIEW SAME DAY COMPARISON:  08/30/2017 FINDINGS: Endotracheal tube has been readjusted and now terminates 8.8 cm above the carina. Enteric catheter likely within the stomach. Left-sided PICC line terminates in the expected location of superior vena cava. Enlarged cardiac silhouette.  Mediastinal contours appear intact. There is no evidence of focal airspace consolidation, pleural effusion or pneumothorax. Mild peribronchial thickening. Small left pleural effusion. Osseous structures are without acute abnormality. Soft tissues are grossly normal. IMPRESSION: Left-sided PICC line terminates in the expected location of superior vena cava. No evidence of pneumothorax. Endotracheal tube terminates 8.8 cm above the carina. Advancement with approximately 4 cm may be considered. Low lung volumes with chronic bronchitic changes. Small left pleural effusion. Electronically Signed  By: Fidela Salisbury M.D.   On: 08/30/2017 15:26   Portable Chest X-ray  Result Date: 08/30/2017 CLINICAL DATA:  84 year old male status post intubation. EXAM: PORTABLE CHEST 1 VIEW COMPARISON:  Chest radiograph dated 08/29/2017. FINDINGS: There has been interval placement of an endotracheal tube with tip approximately at the level of the carina. Recommend retraction of the tube by approximately 3-4 cm. Enteric tube extends below the diaphragm with side port in the left upper abdomen  and tip beyond the inferior margin of the image. There is shallow inspiration with bibasilar atelectatic changes. A small left pleural effusion may be present. Pneumonia is not excluded. There is no pneumothorax. Stable mild cardiomegaly. No acute osseous pathology. IMPRESSION: 1. Endotracheal tube at the level of the carina. Recommend retraction by approximately 3-4 cm for optimal positioning. 2. Shallow inspiration with bibasilar atelectasis. Possible small left pleural effusion. These results were called by telephone at the time of interpretation on 08/30/2017 at 2:03 am to Dr. Olevia Bowens, who verbally acknowledged these results. Electronically Signed   By: Anner Crete M.D.   On: 08/30/2017 02:17   Dg Chest Port 1 View  Result Date: 08/29/2017 CLINICAL DATA:  NG tube placement EXAM: PORTABLE CHEST 1 VIEW COMPARISON:  08/29/2017 FINDINGS: Cardiomegaly with vascular congestion. Small left effusion and patchy basilar atelectasis or infiltrate on the left. Esophageal tube tip extends below diaphragm but tip is incompletely included. IMPRESSION: 1. Esophageal tube tip is below the diaphragm but incompletely included on the current image. 2. Cardiomegaly with vascular congestion. Patchy atelectasis or infiltrate at the left base Electronically Signed   By: Donavan Foil M.D.   On: 08/29/2017 23:26   Dg Chest Port 1 View  Result Date: 08/29/2017 CLINICAL DATA:  Sudden onset shortness of breath EXAM: PORTABLE CHEST 1 VIEW COMPARISON:  08/24/2017, 08/20/2017 FINDINGS: Cardiomegaly with vascular congestion and mild interstitial opacities suggesting edema. Patchy atelectasis or infiltrate at the left base. No pneumothorax. IMPRESSION: 1. Cardiomegaly with vascular congestion and mild interstitial opacity suggesting minimal edema. 2. Patchy atelectasis versus minimal infiltrate at the left base. Electronically Signed   By: Donavan Foil M.D.   On: 08/29/2017 23:16   Dg Abd Acute W/chest  Result Date:  08/24/2017 CLINICAL DATA:  Nausea, vomiting, abdominal pain EXAM: DG ABDOMEN ACUTE W/ 1V CHEST COMPARISON:  08/20/2017 FINDINGS: Cardiomegaly.  Bibasilar atelectasis.  No effusions. Mildly prominent small bowel loops in the mid abdomen. Gas is noted within decompressed colon. Cannot exclude partial small bowel obstruction. No free air organomegaly. IMPRESSION: Dilated small bowel loops in the mid abdomen. Cannot exclude partial small bowel obstruction. Cardiomegaly, bibasilar atelectasis. Electronically Signed   By: Rolm Baptise M.D.   On: 08/24/2017 14:53   Dg Abd Portable 2v  Result Date: 08/29/2017 CLINICAL DATA:  Shortness of breath, history of colon cancer EXAM: PORTABLE ABDOMEN - 2 VIEW COMPARISON:  CT abdomen 08/28/2017 FINDINGS: There is persistent severe dilatation of the small bowel. There is no evidence of pneumoperitoneum, portal venous gas or pneumatosis. There are no pathologic calcifications along the expected course of the ureters. There is contrast in the bladder from recent IV contrast injection. There is stable cardiomegaly. There elevation of the right diaphragm. The osseous structures are unremarkable. IMPRESSION: Persistent small-bowel obstruction. Electronically Signed   By: Kathreen Devoid   On: 08/29/2017 09:47    Orson Eva, DO  [facility] Pager [phone]  If 7PM-7AM, please contact night-coverage [email or web address] Password [email or web address] 08/31/2017, 8:13 AM   LOS: 3 days

## 2017-08-31 NOTE — Progress Notes (Signed)
Pt has had very little urinary output. RN and NT Bladder scanned pt and bladder scan revealed no urine in bladder. A reading would not display on screen. Pt is beginning to swell in extremities, abdomen very firm and when pressed pt HR increases, pt grimaces, and begins to move. No output in colostomy either.   Margaret Pyle, RN

## 2017-08-31 NOTE — Progress Notes (Signed)
Have been able to come off of oxygen down to 70, Still concern about Ph rate is 34 and patient still at times faster than vent. Patient also beginning to swell in arms. Vt is at 500 . Kidney output almost none. Will continue to monitor.

## 2017-08-31 NOTE — Progress Notes (Signed)
CRITICAL VALUE ALERT  Critical Value:  pH 7.074  Date & Time Notied:  08/31/17 0045  Provider Notified: MD Olevia Bowens

## 2017-08-31 NOTE — Progress Notes (Signed)
Subjective: He is still intubated on the ventilator with no respiratory distress.  He is now on vancomycin Zosyn phenylephrine albumin Solu-Cortef amiodarone and sodium bicarbonate drips.  He is poorly responsive but does occasionally appear to be in pain.  Pain medication tends to make his blood pressure drop.  He has poor urine output.  His lactate and pro calcitonin are still elevated his renal function is worse  Objective: Vital signs in last 24 hours: Temp:  [98.2 F (36.8 C)-98.6 F (37 C)] 98.4 F (36.9 C) (01/20 0738) Pulse Rate:  [81-143] 126 (01/20 0745) Resp:  [22-38] 33 (01/20 0745) BP: (68-145)/(41-94) 105/41 (01/20 0745) SpO2:  [92 %-100 %] 96 % (01/20 0828) FiO2 (%):  [65 %-100 %] 65 % (01/20 0828) Weight:  [100.7 kg (222 lb 0.1 oz)] 100.7 kg (222 lb 0.1 oz) (01/20 0500) Weight change: 7.8 kg (17 lb 3.1 oz) Last BM Date: 08/31/17  Intake/Output from previous day: 01/19 0701 - 01/20 0700 In: 3480.5 [I.V.:3270.5; IV Piggyback:210] Out: 750 [Urine:500; Emesis/NG output:250]  PHYSICAL EXAM General appearance: Intubated sedated on mechanical ventilation Resp: rales bilaterally and rhonchi bilaterally Cardio: irregularly irregular rhythm GI: Abdomen still distended Extremities: He has some third spacing of IV fluids Throat is clear  Lab Results:  Results for orders placed or performed during the hospital encounter of 08/28/17 (from the past 48 hour(s))  Draw ABG 1 hour after initiation of ventilator     Status: Abnormal   Collection Time: 08/30/17  2:00 AM  Result Value Ref Range   FIO2 100.00    Delivery systems VENTILATOR    Mode PRESSURE REGULATED VOLUME CONTROL    VT 500 mL   LHR 20 resp/min   Peep/cpap 5.0 cm H20   pH, Arterial 7.269 (L) 7.350 - 7.450   pCO2 arterial 40.8 32.0 - 48.0 mmHg   pO2, Arterial 71.6 (L) 83.0 - 108.0 mmHg   Bicarbonate 17.9 (L) 20.0 - 28.0 mmol/L   Acid-base deficit 7.6 (H) 0.0 - 2.0 mmol/L   O2 Saturation 90.4 %   Collection  site BRACH[facility] ARTERY    Drawn by [id number]    Sample type ARTERIAL    Mechanical Rate 20   Basic metabolic panel     Status: Abnormal   Collection Time: 08/30/17  4:37 AM  Result Value Ref Range   Sodium 148 (H) 135 - 145 mmol/L   Potassium 4.5 3.5 - 5.1 mmol/L   Chloride 115 (H) 101 - 111 mmol/L   CO2 20 (L) 22 - 32 mmol/L   Glucose, Bld 85 65 - 99 mg/dL   BUN 37 (H) 6 - 20 mg/dL   Creatinine, Ser 2.23 (H) 0.61 - 1.24 mg/dL   Calcium 8.5 (L) 8.9 - 10.3 mg/dL   GFR calc non Af Amer 25 (L) >60 mL/min   GFR calc Af Amer 29 (L) >60 mL/min    Comment: (NOTE) The eGFR has been calculated using the CKD EPI equation. This calculation has not been validated in all clinical situations. eGFR's persistently <60 mL/min signify possible Chronic Kidney Disease.    Anion gap 13 5 - 15  CBC     Status: Abnormal   Collection Time: 08/30/17  4:37 AM  Result Value Ref Range   WBC 30.9 (H) 4.0 - 10.5 K/uL    Comment: WHITE COUNT CONFIRMED ON SMEAR   RBC 4.74 4.22 - 5.81 MIL/uL   Hemoglobin 15.5 13.0 - 17.0 g/dL   HCT 48.5 39.0 - 52.0 %  MCV 102.3 (H) 78.0 - 100.0 fL    Comment: DELTA CHECK NOTED   MCH 32.7 26.0 - 34.0 pg   MCHC 32.0 30.0 - 36.0 g/dL   RDW 13.9 11.5 - 15.5 %   Platelets 193 150 - 400 K/uL  Blood gas, arterial     Status: Abnormal   Collection Time: 08/30/17  9:50 AM  Result Value Ref Range   FIO2 100.00    Delivery systems VENTILATOR    Mode PRESSURE REGULATED VOLUME CONTROL    VT 500 mL   LHR 20 resp/min   Peep/cpap 5.0 cm H20   pH, Arterial 7.292 (L) 7.350 - 7.450   pCO2 arterial 34.7 32.0 - 48.0 mmHg   pO2, Arterial 56.2 (L) 83.0 - 108.0 mmHg   Bicarbonate 17.3 (L) 20.0 - 28.0 mmol/L   Acid-base deficit 9.0 (H) 0.0 - 2.0 mmol/L   O2 Saturation 95.2 %   Collection site [facility]ACH[facility] ARTERY    Drawn by [id number]    Sample type ARTERIAL    Allens test (pass/fail) NOT INDICATED (A) PASS  Culture, blood (Routine X 2) w Reflex to ID Panel     Status: None (Preliminary result)    Collection Time: 08/30/17 10:44 AM  Result Value Ref Range   Specimen Description LEFT ANTECUBITAL    Special Requests      BOTTLES DRAWN AEROBIC AND ANAEROBIC Blood Culture adequate volume   Culture  Setup Time PENDING    Culture NO GROWTH < 24 HOURS    Report Status PENDING   Procalcitonin - Baseline     Status: None   Collection Time: 08/30/17 10:44 AM  Result Value Ref Range   Procalcitonin 62.10 ng/mL    Comment:        Interpretation: PCT >= 10 ng/mL: Important systemic inflammatory response, almost exclusively due to severe bacterial sepsis or septic shock. (NOTE)       Sepsis PCT Algorithm           Lower Respiratory Tract                                      Infection PCT Algorithm    ----------------------------     ----------------------------         PCT < 0.25 ng/mL                PCT < 0.10 ng/mL         Strongly encourage             Strongly discourage   discontinuation of antibiotics    initiation of antibiotics    ----------------------------     -----------------------------       PCT 0.25 - 0.50 ng/mL            PCT 0.10 - 0.25 ng/mL               OR       >80% decrease in PCT            Discourage initiation of                                            antibiotics      Encourage discontinuation           of antibiotics    ----------------------------     -----------------------------  PCT >= 0.50 ng/mL              PCT 0.26 - 0.50 ng/mL                AND       <80% decrease in PCT             Encourage initiation of                                             antibiotics       Encourage continuation           of antibiotics    ----------------------------     -----------------------------        PCT >= 0.50 ng/mL                  PCT > 0.50 ng/mL               AND         increase in PCT                  Strongly encourage                                      initiation of antibiotics    Strongly encourage escalation           of  antibiotics                                     -----------------------------                                           PCT <= 0.25 ng/mL                                                 OR                                        > 80% decrease in PCT                                     Discontinue / Do not initiate                                             antibiotics   Culture, blood (Routine X 2) w Reflex to ID Panel     Status: None (Preliminary result)   Collection Time: 08/30/17 10:45 AM  Result Value Ref Range   Specimen Description BLOOD LEFT HAND    Special Requests      BOTTLES DRAWN AEROBIC AND ANAEROBIC Blood Culture adequate volume   Culture NO GROWTH < 24  HOURS    Report Status PENDING   Lactic acid, plasma     Status: Abnormal   Collection Time: 08/30/17 11:02 AM  Result Value Ref Range   Lactic Acid, Venous 3.8 (HH) 0.5 - 1.9 mmol/L    Comment: CRITICAL RESULT CALLED TO, READ BACK BY AND VERIFIED WITH: HYLTON L. @ 1149 ON [id number] BY HENDERSON L.   Lactic acid, plasma     Status: Abnormal   Collection Time: 08/30/17  3:07 PM  Result Value Ref Range   Lactic Acid, Venous 2.5 (HH) 0.5 - 1.9 mmol/L    Comment: CRITICAL RESULT CALLED TO, READ BACK BY AND VERIFIED WITH: HOBBS A. @ 1603 ON [id number] BY HENDERSON L.   Lactic acid, plasma     Status: Abnormal   Collection Time: 08/30/17  6:15 PM  Result Value Ref Range   Lactic Acid, Venous 2.1 (HH) 0.5 - 1.9 mmol/L    Comment: CRITICAL RESULT CALLED TO, READ BACK BY AND VERIFIED WITH: HYLTON,L @ 1908 ON 08/30/17 BY JUW   Blood gas, arterial     Status: Abnormal   Collection Time: 08/31/17 12:30 AM  Result Value Ref Range   FIO2 100.00    Delivery systems VENTILATOR    Mode PRESSURE REGULATED VOLUME CONTROL    VT 390 mL   LHR 34 resp/min   Peep/cpap 10.0 cm H20   pH, Arterial 7.074 (LL) 7.350 - 7.450    Comment: CRITICAL RESULT CALLED TO, READ BACK BY AND VERIFIED WITH: READ BACK TO CAMERON RN AT [phone] 08/31/17  [place] RRT    pCO2 arterial 51.5 (H) 32.0 - 48.0 mmHg   pO2, Arterial 93.9 83.0 - 108.0 mmHg   Bicarbonate 12.6 (L) 20.0 - 28.0 mmol/L   Acid-base deficit 13.9 (H) 0.0 - 2.0 mmol/L   O2 Saturation 94.0 %   Collection site BRACHIAL ARTERY    Drawn by [id number]    Sample type ARTERIAL    Allens test (pass/fail) NOT INDICATED (A) PASS  Lactic acid, plasma     Status: Abnormal   Collection Time: 08/31/17  1:20 AM  Result Value Ref Range   Lactic Acid, Venous 2.8 (HH) 0.5 - 1.9 mmol/L    Comment: CRITICAL RESULT CALLED TO, READ BACK BY AND VERIFIED WITH: ACORD,B @ 0216 ON 08/31/17 BY JUW   CBC with Differential/Platelet     Status: Abnormal   Collection Time: 08/31/17  1:21 AM  Result Value Ref Range   WBC 34.3 (H) 4.0 - 10.5 K/uL   RBC 4.06 (L) 4.22 - 5.81 MIL/uL   Hemoglobin 12.9 (L) 13.0 - 17.0 g/dL   HCT 42.9 39.0 - 52.0 %   MCV 105.7 (H) 78.0 - 100.0 fL   MCH 31.8 26.0 - 34.0 pg   MCHC 30.1 30.0 - 36.0 g/dL   RDW 14.7 11.5 - 15.5 %   Platelets 165 150 - 400 K/uL   Neutrophils Relative % 94 %   Lymphocytes Relative 3 %   Monocytes Relative 3 %   Eosinophils Relative 0 %   Basophils Relative 0 %   Neutro Abs 32.3 (H) 1.7 - 7.7 K/uL   Lymphs Abs 1.0 0.7 - 4.0 K/uL   Monocytes Absolute 1.0 0.1 - 1.0 K/uL   Eosinophils Absolute 0.0 0.0 - 0.7 K/uL   Basophils Absolute 0.0 0.0 - 0.1 K/uL   WBC Morphology      MODERATE LEFT SHIFT (>5% METAS AND MYELOS,OCC PRO NOTED)    Comment: WHITE COUNT CONFIRMED  ON SMEAR  Comprehensive metabolic panel     Status: Abnormal   Collection Time: 08/31/17  1:21 AM  Result Value Ref Range   Sodium 151 (H) 135 - 145 mmol/L   Potassium 6.3 (HH) 3.5 - 5.1 mmol/L    Comment: CRITICAL RESULT CALLED TO, READ BACK BY AND VERIFIED WITH: HOWARD,C @ 0218 ON 08/31/17 BY JUW    Chloride 124 (H) 101 - 111 mmol/L   CO2 16 (L) 22 - 32 mmol/L   Glucose, Bld 81 65 - 99 mg/dL   BUN 41 (H) 6 - 20 mg/dL   Creatinine, Ser 2.68 (H) 0.61 - 1.24 mg/dL   Calcium 8.0 (L)  8.9 - 10.3 mg/dL   Total Protein 6.1 (L) 6.5 - 8.1 g/dL   Albumin 2.5 (L) 3.5 - 5.0 g/dL   AST 44 (H) 15 - 41 U/L   ALT 30 17 - 63 U/L   Alkaline Phosphatase 73 38 - 126 U/L   Total Bilirubin 2.9 (H) 0.3 - 1.2 mg/dL   GFR calc non Af Amer 20 (L) >60 mL/min   GFR calc Af Amer 23 (L) >60 mL/min    Comment: (NOTE) The eGFR has been calculated using the CKD EPI equation. This calculation has not been validated in all clinical situations. eGFR's persistently <60 mL/min signify possible Chronic Kidney Disease.    Anion gap 11 5 - 15  Magnesium     Status: None   Collection Time: 08/31/17  1:21 AM  Result Value Ref Range   Magnesium 2.1 1.7 - 2.4 mg/dL  Phosphorus     Status: Abnormal   Collection Time: 08/31/17  1:21 AM  Result Value Ref Range   Phosphorus 7.0 (H) 2.5 - 4.6 mg/dL  Procalcitonin     Status: None   Collection Time: 08/31/17  3:49 AM  Result Value Ref Range   Procalcitonin 64.58 ng/mL    Comment:        Interpretation: PCT >= 10 ng/mL: Important systemic inflammatory response, almost exclusively due to severe bacterial sepsis or septic shock. (NOTE)       Sepsis PCT Algorithm           Lower Respiratory Tract                                      Infection PCT Algorithm    ----------------------------     ----------------------------         PCT < 0.25 ng/mL                PCT < 0.10 ng/mL         Strongly encourage             Strongly discourage   discontinuation of antibiotics    initiation of antibiotics    ----------------------------     -----------------------------       PCT 0.25 - 0.50 ng/mL            PCT 0.10 - 0.25 ng/mL               OR       >80% decrease in PCT            Discourage initiation of  antibiotics      Encourage discontinuation           of antibiotics    ----------------------------     -----------------------------         PCT >= 0.50 ng/mL              PCT 0.26 - 0.50 ng/mL                 AND       <80% decrease in PCT             Encourage initiation of                                             antibiotics       Encourage continuation           of antibiotics    ----------------------------     -----------------------------        PCT >= 0.50 ng/mL                  PCT > 0.50 ng/mL               AND         increase in PCT                  Strongly encourage                                      initiation of antibiotics    Strongly encourage escalation           of antibiotics                                     -----------------------------                                           PCT <= 0.25 ng/mL                                                 OR                                        > 80% decrease in PCT                                     Discontinue / Do not initiate                                             antibiotics   Lactic acid, plasma     Status: Abnormal   Collection Time: 08/31/17  3:49 AM  Result Value Ref Range   Lactic Acid,  Venous 2.9 (HH) 0.5 - 1.9 mmol/L    Comment: CRITICAL RESULT CALLED TO, READ BACK BY AND VERIFIED WITH: ACORD,B @ 0458 ON 08/31/17 BY JUW   Glucose, capillary     Status: None   Collection Time: 08/31/17  4:29 AM  Result Value Ref Range   Glucose-Capillary 91 65 - 99 mg/dL   Comment 1 Notify RN   Blood gas, arterial     Status: Abnormal   Collection Time: 08/31/17  5:30 AM  Result Value Ref Range   FIO2 65.00    Delivery systems VENTILATOR    Mode PRESSURE REGULATED VOLUME CONTROL    VT 500 mL   LHR 34 resp/min   Peep/cpap 10.0 cm H20   pH, Arterial 7.215 (L) 7.350 - 7.450   pCO2 arterial 41.6 32.0 - 48.0 mmHg   pO2, Arterial 82.6 (L) 83.0 - 108.0 mmHg   Bicarbonate 15.9 (L) 20.0 - 28.0 mmol/L   Acid-base deficit 10.2 (H) 0.0 - 2.0 mmol/L   O2 Saturation 95.3 %   Collection site RADIAL    Drawn by [id number]    Sample type ARTERIAL    Allens test (pass/fail) PASS PASS   Mechanical Rate 34   Glucose, capillary      Status: None   Collection Time: 08/31/17  7:36 AM  Result Value Ref Range   Glucose-Capillary 83 65 - 99 mg/dL    ABGS Recent Labs    08/31/17 0530  PHART 7.215*  PO2ART 82.6*  HCO3 15.9*   CULTURES Recent Results (from the past 240 hour(s))  MRSA PCR Screening     Status: Abnormal   Collection Time: 08/28/17  4:55 PM  Result Value Ref Range Status   MRSA by PCR POSITIVE (A) NEGATIVE Final    Comment: RESULT CALLED TO, READ BACK BY AND VERIFIED WITH: HOWERTON M. @ 1830 ON [id number]        The GeneXpert MRSA Assay (FDA approved for NASAL specimens only), is one component of a comprehensive MRSA colonization surveillance program. It is not intended to diagnose MRSA infection nor to guide or monitor treatment for MRSA infections.   Culture, Urine     Status: Abnormal   Collection Time: 08/29/17  7:26 AM  Result Value Ref Range Status   Specimen Description URINE, CLEAN CATCH  Final   Special Requests NONE  Final   Culture (A)  Final    <10,000 COLONIES/mL INSIGNIFICANT GROWTH Performed at [facility], [address], [place], [place] [address]    Report Status 08/30/2017 FINAL  Final  Culture, blood (Routine X 2) w Reflex to ID Panel     Status: None (Preliminary result)   Collection Time: 08/30/17 10:44 AM  Result Value Ref Range Status   Specimen Description LEFT ANTECUBITAL  Final   Special Requests   Final    BOTTLES DRAWN AEROBIC AND ANAEROBIC Blood Culture adequate volume   Culture  Setup Time PENDING  Incomplete   Culture NO GROWTH < 24 HOURS  Final   Report Status PENDING  Incomplete  Culture, blood (Routine X 2) w Reflex to ID Panel     Status: None (Preliminary result)   Collection Time: 08/30/17 10:45 AM  Result Value Ref Range Status   Specimen Description BLOOD LEFT HAND  Final   Special Requests   Final    BOTTLES DRAWN AEROBIC AND ANAEROBIC Blood Culture adequate volume   Culture NO GROWTH < 24 HOURS  Final   Report Status PENDING  Incomplete   Studies/Results: US Abdomen Complete  Result Date: 08/29/2017 CLINICAL DATA:  Elevated liver function tests. Acute renal insufficiency. EXAM: ABDOMEN ULTRASOUND COMPLETE COMPARISON:  Abdominal CT 08/28/2017 FINDINGS: Gallbladder: The gallbladder wall is normally distended. There is a calculus measuring 1.2 cm in the neck of the gallbladder without appreciable gallbladder wall thickening. The gallbladder wall measures 1.8 mm. No pericholecystic fluid. Common bile duct: Diameter: 6.2 mm Liver: No focal lesion identified. Coarse parenchymal echogenicity. Portal vein is patent on color Doppler imaging with normal direction of blood flow towards the liver. IVC: Limited visualization without focal abnormality. Pancreas: Not seen. Spleen: Size and appearance within normal limits. Right Kidney: Length: 10.1 cm. Echogenicity within normal limits. Moderate hydronephrosis visualized. Left Kidney: Length: 11.8 cm. Echogenicity within normal limits. Moderate hydronephrosis visualized. Abdominal aorta: Mid to distal aorta is not seen. No aneurysmal dilation of the proximal aorta. Other findings: None. IMPRESSION: Limited visualization due to ongoing small bowel obstruction. Cholelithiasis without evidence of acute cholecystitis. Bilateral moderate hydronephrosis. Electronically Signed   By: Fidela Salisbury M.D.   On: 08/29/2017 10:33   Dg Chest 1v Repeat Same Day  Result Date: 08/30/2017 CLINICAL DATA:  PICC line placement. EXAM: CHEST - 1 VIEW SAME DAY COMPARISON:  08/30/2017 FINDINGS: Endotracheal tube has been readjusted and now terminates 8.8 cm above the carina. Enteric catheter likely within the stomach. Left-sided PICC line terminates in the expected location of superior vena cava. Enlarged cardiac silhouette.  Mediastinal contours appear intact. There is no evidence of focal airspace consolidation, pleural effusion or pneumothorax. Mild peribronchial thickening. Small left pleural effusion.  Osseous structures are without acute abnormality. Soft tissues are grossly normal. IMPRESSION: Left-sided PICC line terminates in the expected location of superior vena cava. No evidence of pneumothorax. Endotracheal tube terminates 8.8 cm above the carina. Advancement with approximately 4 cm may be considered. Low lung volumes with chronic bronchitic changes. Small left pleural effusion. Electronically Signed   By: Fidela Salisbury M.D.   On: 08/30/2017 15:26   Dg Chest Port 1 View  Result Date: 08/31/2017 CLINICAL DATA:  Intubated patient. EXAM: PORTABLE CHEST 1 VIEW COMPARISON:  08/30/2017 FINDINGS: Endotracheal tube is appropriately positioned above the carina. Nasogastric tube extends into the abdomen. Persistent densities at the left chest base. Left hemidiaphragm remains obscured. Mild fullness of the central vascular structures and interstitial markings. Left arm PICC tip is near the SVC. Negative for a pneumothorax. IMPRESSION: Persistent densities at the left lung base. Findings could represent a combination of consolidation and pleural fluid. No significant change. Electronically Signed   By: Markus Daft M.D.   On: 08/31/2017 08:34   Portable Chest X-ray  Result Date: 08/30/2017 CLINICAL DATA:  84 year old male status post intubation. EXAM: PORTABLE CHEST 1 VIEW COMPARISON:  Chest radiograph dated 08/29/2017. FINDINGS: There has been interval placement of an endotracheal tube with tip approximately at the level of the carina. Recommend retraction of the tube by approximately 3-4 cm. Enteric tube extends below the diaphragm with side port in the left upper abdomen and tip beyond the inferior margin of the image. There is shallow inspiration with bibasilar atelectatic changes. A small left pleural effusion may be present. Pneumonia is not excluded. There is no pneumothorax. Stable mild cardiomegaly. No acute osseous pathology. IMPRESSION: 1. Endotracheal tube at the level of the carina. Recommend  retraction by approximately 3-4 cm for optimal positioning. 2. Shallow inspiration with bibasilar atelectasis. Possible small left pleural effusion. These results were called by telephone at the  time of interpretation on 08/30/2017 at 2:03 am to Dr. Olevia Bowens, who verbally acknowledged these results. Electronically Signed   By: Anner Crete M.D.   On: 08/30/2017 02:17   Dg Chest Port 1 View  Result Date: 08/29/2017 CLINICAL DATA:  NG tube placement EXAM: PORTABLE CHEST 1 VIEW COMPARISON:  08/29/2017 FINDINGS: Cardiomegaly with vascular congestion. Small left effusion and patchy basilar atelectasis or infiltrate on the left. Esophageal tube tip extends below diaphragm but tip is incompletely included. IMPRESSION: 1. Esophageal tube tip is below the diaphragm but incompletely included on the current image. 2. Cardiomegaly with vascular congestion. Patchy atelectasis or infiltrate at the left base Electronically Signed   By: Donavan Foil M.D.   On: 08/29/2017 23:26   Dg Chest Port 1 View  Result Date: 08/29/2017 CLINICAL DATA:  Sudden onset shortness of breath EXAM: PORTABLE CHEST 1 VIEW COMPARISON:  08/24/2017, 08/20/2017 FINDINGS: Cardiomegaly with vascular congestion and mild interstitial opacities suggesting edema. Patchy atelectasis or infiltrate at the left base. No pneumothorax. IMPRESSION: 1. Cardiomegaly with vascular congestion and mild interstitial opacity suggesting minimal edema. 2. Patchy atelectasis versus minimal infiltrate at the left base. Electronically Signed   By: Donavan Foil M.D.   On: 08/29/2017 23:16    Medications:  Prior to Admission:  Medications Prior to Admission  Medication Sig Dispense Refill Last Dose  . diltiazem (CARDIZEM CD) 240 MG 24 hr capsule Take 1 capsule (240 mg total) by mouth daily.   08/25/2017 at 0900  . latanoprost (XALATAN) 0.005 % ophthalmic solution Place 1 drop into both eyes at bedtime.    08/27/2017 at 1931  . pantoprazole (PROTONIX) 40 MG tablet Take  40 mg by mouth daily.   08/27/2017 at 0900  . timolol (BETIMOL) 0.25 % ophthalmic solution Place 1 drop into the left eye 2 (two) times daily.    08/28/2017 at 0927  . ciprofloxacin (CIPRO) 500 MG tablet Take 500 mg by mouth 2 (two) times daily.   Completed Course at Unknown time   Scheduled: . chlorhexidine gluconate (MEDLINE KIT)  15 mL Mouth Rinse BID  . Chlorhexidine Gluconate Cloth  6 each Topical Q0600  . hydrocortisone sod succinate (SOLU-CORTEF) inj  100 mg Intravenous Q8H  . latanoprost  1 drop Both Eyes QHS  . mouth rinse  15 mL Mouth Rinse QID  . mupirocin ointment  1 application Nasal BID  . pantoprazole (PROTONIX) IV  40 mg Intravenous Q12H  . sodium chloride flush  3 mL Intravenous Q12H  . timolol  1 drop Left Eye BID   Continuous: . amiodarone 30 mg/hr (08/31/17 0758)  . dextrose 1 mL (08/31/17 0911)  . phenylephrine (NEO-SYNEPHRINE) Adult infusion 90 mcg/min (08/31/17 0938)  . piperacillin-tazobactam (ZOSYN)  IV Stopped (08/31/17 0920)  .  sodium bicarbonate (isotonic) infusion in sterile water 100 mL/hr at 08/31/17 0205  . vancomycin     QPY:PPJKDTOI (SUBLIMAZE) injection, fentaNYL (SUBLIMAZE) injection, midazolam, midazolam, ondansetron **OR** ondansetron (ZOFRAN) IV  Assesment: He was admitted initially with small bowel obstruction and then had acute deterioration in his respiratory status presumably secondary to aspiration pneumonia.  He has an infiltrate on his chest x-ray in the left which I have personally reviewed.  He required intubation and mechanical ventilation.  He has continued with a generally deteriorating condition.  He has metabolic acidemia and is on sodium bicarbonate drip.  He has atrial fib with rapid ventricular response and is on amiodarone he is on Zosyn and vancomycin.  He continues to have  what seems to be abdominal pain.  His renal function has worsened and he has poor urine output.  His lactate level is still elevated and his pro calcitonin level is  also still elevated Principal Problem:   SBO (small bowel obstruction) (HCC) Active Problems:   SAH (subarachnoid hemorrhage) (HCC)   Hypokalemia   AF (paroxysmal atrial fibrillation) (HCC)   Leucocytosis   Essential hypertension   AKI (acute kidney injury) ([place])   Sepsis due to undetermined organism ([facility])   Aspiration pneumonia of both lower lobes due to gastric secretions (HCC)   Acute respiratory failure with hypoxia (HCC)   Sepsis (HCC)   Endotracheal tube present   Hyperkalemia   Hydronephrosis    Plan: Per Dr. Doristine Devoid note yesterday his power of attorney wanted to go ahead with 24 hours of aggressive treatment with the plan that if he was not improving to proceed to comfort care.  He is not improving    LOS: 3 days   Katya Rolston L 08/31/2017, 9:52 AM

## 2017-08-31 NOTE — Progress Notes (Signed)
CRITICAL VALUE ALERT  Critical Value: Lactic 2.9  Date & Time Notied:   08/31/2017 0500  Provider Notified:MD Olevia Bowens

## 2017-08-31 NOTE — Progress Notes (Signed)
CRITICAL VALUE ALERT  Critical Value:  K+ 6.3, Lactic 2.8  Date & Time Notied:  08/31/2017 0215  Provider Notified: MD Olevia Bowens

## 2017-09-01 ENCOUNTER — Encounter (HOSPITAL_COMMUNITY): Payer: Self-pay | Admitting: Primary Care

## 2017-09-01 ENCOUNTER — Inpatient Hospital Stay (HOSPITAL_COMMUNITY): Payer: Medicare Other

## 2017-09-01 DIAGNOSIS — Z7189 Other specified counseling: Secondary | ICD-10-CM

## 2017-09-01 DIAGNOSIS — Z515 Encounter for palliative care: Secondary | ICD-10-CM

## 2017-09-01 DIAGNOSIS — N1339 Other hydronephrosis: Secondary | ICD-10-CM

## 2017-09-01 LAB — BLOOD GAS, ARTERIAL
Acid-base deficit: 7.7 mmol/L — ABNORMAL HIGH (ref 0.0–2.0)
BICARBONATE: 18.6 mmol/L — AB (ref 20.0–28.0)
Drawn by: 10555
FIO2: 55
MECHANICAL RATE: 34
MECHVT: 500 mL
O2 Saturation: 98.5 %
PCO2 ART: 30.4 mmHg — AB (ref 32.0–48.0)
PEEP: 10 cmH2O
PO2 ART: 135 mmHg — AB (ref 83.0–108.0)
RATE: 34 resp/min
pH, Arterial: 7.359 (ref 7.350–7.450)

## 2017-09-01 LAB — COMPREHENSIVE METABOLIC PANEL
ALBUMIN: 1.9 g/dL — AB (ref 3.5–5.0)
ALT: 40 U/L (ref 17–63)
ANION GAP: 10 (ref 5–15)
AST: 61 U/L — AB (ref 15–41)
Alkaline Phosphatase: 76 U/L (ref 38–126)
BILIRUBIN TOTAL: 2.2 mg/dL — AB (ref 0.3–1.2)
BUN: 53 mg/dL — AB (ref 6–20)
CHLORIDE: 117 mmol/L — AB (ref 101–111)
CO2: 18 mmol/L — AB (ref 22–32)
Calcium: 8.2 mg/dL — ABNORMAL LOW (ref 8.9–10.3)
Creatinine, Ser: 3.18 mg/dL — ABNORMAL HIGH (ref 0.61–1.24)
GFR calc Af Amer: 19 mL/min — ABNORMAL LOW (ref >60)
GFR calc non Af Amer: 16 mL/min — ABNORMAL LOW (ref >60)
GLUCOSE: 152 mg/dL — AB (ref 65–99)
POTASSIUM: 4.2 mmol/L (ref 3.5–5.1)
SODIUM: 145 mmol/L (ref 135–145)
TOTAL PROTEIN: 5.4 g/dL — AB (ref 6.5–8.1)

## 2017-09-01 LAB — CBC
HEMATOCRIT: 37.3 % — AB (ref 39.0–52.0)
HEMOGLOBIN: 11.8 g/dL — AB (ref 13.0–17.0)
MCH: 31.6 pg (ref 26.0–34.0)
MCHC: 31.6 g/dL (ref 30.0–36.0)
MCV: 100 fL (ref 78.0–100.0)
Platelets: 124 10*3/uL — ABNORMAL LOW (ref 150–400)
RBC: 3.73 MIL/uL — ABNORMAL LOW (ref 4.22–5.81)
RDW: 14.6 % (ref 11.5–15.5)
WBC: 28.3 10*3/uL — ABNORMAL HIGH (ref 4.0–10.5)

## 2017-09-01 LAB — PROCALCITONIN: PROCALCITONIN: 42.92 ng/mL

## 2017-09-01 LAB — GLUCOSE, CAPILLARY
GLUCOSE-CAPILLARY: 140 mg/dL — AB (ref 65–99)
GLUCOSE-CAPILLARY: 150 mg/dL — AB (ref 65–99)
Glucose-Capillary: 140 mg/dL — ABNORMAL HIGH (ref 65–99)

## 2017-09-01 LAB — MAGNESIUM: Magnesium: 2.2 mg/dL (ref 1.7–2.4)

## 2017-09-01 LAB — VANCOMYCIN, RANDOM: VANCOMYCIN RM: 15

## 2017-09-01 MED ORDER — ARTIFICIAL TEARS OPHTHALMIC OINT
TOPICAL_OINTMENT | OPHTHALMIC | Status: DC | PRN
  Filled 2017-09-01: qty 3.5

## 2017-09-01 MED ORDER — VANCOMYCIN HCL IN DEXTROSE 1-5 GM/200ML-% IV SOLN
1000.0000 mg | INTRAVENOUS | Status: DC
  Administered 2017-09-01: 1000 mg via INTRAVENOUS
  Filled 2017-09-01: qty 200

## 2017-09-01 MED ORDER — MIDAZOLAM HCL 2 MG/2ML IJ SOLN: 2.0000 mg | INTRAMUSCULAR | Status: DC | PRN

## 2017-09-01 MED ORDER — FENTANYL CITRATE (PF) 100 MCG/2ML IJ SOLN
100.0000 ug | INTRAMUSCULAR | Status: DC | PRN
  Administered 2017-09-02: 100 ug via INTRAVENOUS
  Filled 2017-09-01: qty 2

## 2017-09-01 MED ORDER — PIPERACILLIN-TAZOBACTAM 3.375 G IVPB: 3.3750 g | Freq: Two times a day (BID) | INTRAVENOUS | Status: DC

## 2017-09-01 NOTE — Consult Note (Signed)
Consultation Note Date: 09/01/2017   Patient Name: Joel Bishop  DOB: August 16, 1927  MRN: [id number]  Age / Sex: 84 y.o., male  PCP: Rosita Fire, MD Referring Physician: Orson Eva, MD  Reason for Consultation: Establishing goals of care, Psychosocial/spiritual support and Withdrawal of life-sustaining treatment  HPI/Patient Profile: 84 y.o. male  with past medical history of ostomy, fall with some arachnoid hemorrhage, aspiration, chronic kidney disease, admitted on 08/28/2017 with sepsis, hypotension.   Clinical Assessment and Goals of Care: Mr. Fortson is resting quietly in bed.  He is intubated/ventilated.  He will briefly open his eyes, but not try to communicate.  Present today at bedside his Sister Joel Bishop.  She shares with me that she has another living brother in [place], but his sister who died many years ago.  They also have a niece who is supportive.  Needs he shares that she is healthcare power of attorney for her brother.  She shares that her brother in [place] and her niece all except her decision to allow a natural death for her brother.    We talked about compassionate extubation, symptom management.  Joel Bishop states that she is ready for compassionate extubation at this time.  She shares that she understands her brother will never improve.  We talked about her support system.  She states that she is divorced, and her ex-husband has passed.  Ms. Mancel Bale states that she feels she cannot be here for her brother's passing.  I share that nursing staff will be here with Mr. Luu, he will be supported.  Detail conference with nursing staff related to compassionate extubation and full comfort care. Detailed conversation with respiratory therapy related to compassionate extubation.   Healthcare power of attorney HCPOA  -sister Joel Bishop.  SUMMARY OF RECOMMENDATIONS   Full  comfort care. Compassionate extubation.  Code Status/Advance Care Planning:  DNR  Symptom Management:   Liberalized pain and anxiety medication for comfort.  Palliative Prophylaxis:   Frequent Pain Assessment and Turn Reposition  Additional Recommendations (Limitations, Scope, Preferences):  Full Comfort Care  Psycho-social/Spiritual:   Desire for further Chaplaincy support:no  Additional Recommendations: Caregiving  Support/Resources  Prognosis:   Hours - Days  Discharge Planning: Anticipated Hospital Death      Primary Diagnoses: Present on Admission: . Hypokalemia . AF (paroxysmal atrial fibrillation) ([place]) . Leucocytosis . Essential hypertension . Sepsis ([place])   I have reviewed the medical record, interviewed the patient and family, and examined the patient. The following aspects are pertinent.  Past Medical History:  Diagnosis Date  . At risk for falls   . Atrial fibrillation ([place])   . Chronic anticoagulation 2002  . Colon carcinoma ([place]) 1998   s/p partial colectomy and permanent colostomyin 1998  . Colostomy in place [facility])   . Glaucoma   . Glaucoma   . Hypertension   . Nontraumatic subarachnoid hemorrhage ([place])   . Overweight(278.02)   . Paroxysmal atrial fibrillation ([place]) 2002   Single episode by history during a noncardiac illness  .  UTI (urinary tract infection)    Social History   Socioeconomic History  . Marital status: Married    Spouse name: None  . Number of children: None  . Years of education: None  . Highest education level: None  Social Needs  . Financial resource strain: None  . Food insecurity - worry: None  . Food insecurity - inability: None  . Transportation needs - medical: None  . Transportation needs - non-medical: None  Occupational History  . Occupation: PHD  Tobacco Use  . Smoking status: Never Smoker  . Smokeless tobacco: Never Used  Substance and Sexual Activity  . Alcohol use: No    Alcohol/week: 0.0  oz  . Drug use: No  . Sexual activity: None  Other Topics Concern  . None  Social History Narrative  . None   Family History  Problem Relation Age of Onset  . Cancer Mother        gallbladder  . Uterine cancer Maternal Aunt   . Pancreatic cancer Brother   . Liver cancer Sister   . Other Brother        amylodosis  . Kidney cancer Sister    Scheduled Meds: . chlorhexidine gluconate (MEDLINE KIT)  15 mL Mouth Rinse BID  . Chlorhexidine Gluconate Cloth  6 each Topical Daily  . hydrocortisone sod succinate (SOLU-CORTEF) inj  100 mg Intravenous Q8H  . latanoprost  1 drop Both Eyes QHS  . mouth rinse  15 mL Mouth Rinse QID  . mupirocin ointment  1 application Nasal BID  . pantoprazole (PROTONIX) IV  40 mg Intravenous Q12H  . sodium chloride flush  10-40 mL Intracatheter Q12H  . timolol  1 drop Left Eye BID   Continuous Infusions: . amiodarone 30 mg/hr (09/01/17 0811)  . dextrose 5 % and 0.45% NaCl 100 mL/hr at 09/01/17 0103  . phenylephrine (NEO-SYNEPHRINE) Adult infusion 20 mcg/min (08/31/17 2249)  . piperacillin-tazobactam (ZOSYN)  IV    . vancomycin Stopped (08/31/17 1319)   PRN Meds:.fentaNYL (SUBLIMAZE) injection, fentaNYL (SUBLIMAZE) injection, midazolam, midazolam, ondansetron **OR** ondansetron (ZOFRAN) IV, sodium chloride flush Medications Prior to Admission:  Prior to Admission medications   Medication Sig Start Date End Date Taking? Authorizing Provider  diltiazem (CARDIZEM CD) 240 MG 24 hr capsule Take 1 capsule (240 mg total) by mouth daily. 08/24/17  Yes Isaac Bliss, Rayford Halsted, MD  latanoprost (XALATAN) 0.005 % ophthalmic solution Place 1 drop into both eyes at bedtime.    Yes [provider]  pantoprazole (PROTONIX) 40 MG tablet Take 40 mg by mouth daily.   Yes [provider]  timolol (BETIMOL) 0.25 % ophthalmic solution Place 1 drop into the left eye 2 (two) times daily.    Yes [provider]  ciprofloxacin (CIPRO) 500 MG tablet  Take 500 mg by mouth 2 (two) times daily.    [provider]   No Known Allergies Review of Systems  Unable to perform ROS: Intubated    Physical Exam  Constitutional: No distress.  Intubated/ventilated, sedated  HENT:  Head: Atraumatic.  Temporal wasting, orbital wasting  Cardiovascular:  Rate 120s at times  Pulmonary/Chest:  Intubated/ventilated  Abdominal: Soft. He exhibits no distension.  Ostomy  Musculoskeletal: He exhibits no edema.  Mild edema  Neurological:  Intubated/ventilated  Skin: Skin is warm and dry.  Nursing note and vitals reviewed.   Vital Signs: BP 90/63   Pulse (!) 127   Temp 98.5 F (36.9 C)   Resp (!) 34  Ht 5' 6" (1.676 m)   Wt 103.8 kg (228 lb 13.4 oz)   SpO2 99%   BMI 36.94 kg/m  Pain Assessment: CPOT   Pain Score: 5    SpO2: SpO2: 99 % O2 Device:SpO2: 99 % O2 Flow Rate: .O2 Flow Rate (L/min): 10 L/min  IO: Intake/output summary:   Intake/Output Summary (Last 24 hours) at 09/01/2017 1100 Last data filed at 09/01/2017 1026 Gross per 24 hour  Intake 2353 ml  Output 610 ml  Net 1743 ml    LBM: Last BM Date: 08/31/17 Baseline Weight: Weight: 100.7 kg (222 lb) Most recent weight: Weight: 103.8 kg (228 lb 13.4 oz)     Palliative Assessment/Data:   Flowsheet Rows     Most Recent Value  Intake Tab  Referral Department  Hospitalist  Unit at Time of Referral  ICU  Palliative Care Primary Diagnosis  Other (Comment) [SBO]  Date Notified  08/31/17  Palliative Care Type  New Palliative care  Reason for referral  Advance Care Planning, Clarify Goals of Care, Psychosocial or Spiritual support  Date of Admission  08/28/17  Date first seen by Palliative Care  09/01/17  # of days Palliative referral response time  1 Day(s)  # of days IP prior to Palliative referral  3  Clinical Assessment  Palliative Performance Scale Score  10%  Pain Max last 24 hours  Not able to report  Pain Min Last 24 hours  Not able to report  Dyspnea  Max Last 24 Hours  Not able to report  Dyspnea Min Last 24 hours  Not able to report  Psychosocial & Spiritual Assessment  Palliative Care Outcomes  Patient/Family meeting held?  Yes  Who was at the meeting?  sister via phone   Palliative Care Outcomes  Clarified goals of care  Patient/Family wishes: Interventions discontinued/not started   Mechanical Ventilation      Time In: 1430 Time Out: 1520 Time Total: 50 minutes Greater than 50%  of this time was spent counseling and coordinating care related to the above assessment and plan.  Signed by: Tasha A Dove, NP   Please contact Palliative Medicine Team phone at [phone] for questions and concerns.  For individual provider: See Amion 

## 2017-09-01 NOTE — Procedures (Signed)
**Note De-Identified Ralphael [place] Obfuscation** Extubation Procedure Note  Patient Details:   Name: Jeven Topper DOB: 11-06-1927 MRN: [id number]   Airway Documentation:     Evaluation  O2 sats: stable throughout Complications: No apparent complications Patient did tolerate procedure well. Bilateral Breath Sounds: Clear, Diminished   No + leak Fatema Rabe, Penni Bombard 09/01/2017, 3:08 PM

## 2017-09-01 NOTE — Clinical Social Work Note (Signed)
CSW notified that pt admitted from [facility]. Reviewed pt's record today. Pt was recently discharged back to [facility]. Pt currently intubated. Will follow and assist when pt stable for LCSW assessment.

## 2017-09-01 NOTE — Progress Notes (Addendum)
Pharmacy Antibiotic Note  Joel Bishop is a 84 y.o. male admitted on 08/28/2017 with sepsis/possible bactermeia.  Pharmacy has been consulted for Vancomycin dosing.   Plan: Continue Vancomycin 1000 mg IV every 24 hours.  Goal trough 15-20 mcg/mL. Random Vanco trough with AM labs 1/22 due to worsening renal function Change to Zosyn 3.375G IV Q12 hours  Monitor labs, c/s, vitals, and trough when needed.   Height: 5\' 6"  (167.6 cm) Weight: 228 lb 13.4 oz (103.8 kg) IBW/kg (Calculated) : 63.8  Temp (24hrs), Avg:98.2 F (36.8 C), Min:97.6 F (36.4 C), Max:98.7 F (37.1 C)  Recent Labs  Lab 08/28/17 1009 08/29/17 0444 08/30/17 0437 08/30/17 1102 08/30/17 1507 08/30/17 1815 08/31/17 0120 08/31/17 0121 08/31/17 0349 08/31/17 1045 09/01/17 0336  WBC 19.1* 19.3* 30.9*  --   --   --   --  34.3*  --   --  28.3*  CREATININE 1.21 1.60* 2.23*  --   --   --   --  2.68*  --  2.16* 3.18*  LATICACIDVEN  --   --   --  3.8* 2.5* 2.1* 2.8*  --  2.9*  --   --   VANCORANDOM  --   --   --   --   --   --   --   --   --  12  --     Estimated Creatinine Clearance: 18.5 mL/min (A) (by C-G formula based on SCr of 3.18 mg/dL (H)).    No Known Allergies  Antimicrobials this admission: Vanco 1/19 >>  Zosyn 1/19 >>   Dose adjustments this admission: Zosyn 3.375G IV Q12 hours (CrCl<20 mL/min)  Microbiology results: 1/19 BCx: NGTD 1/18 UCx: insignificant growth   1/17 MRSA PCR: positive  Thank you for allowing pharmacy to be a part of this patient's care.   Margot Ables, PharmD Clinical Pharmacist 09/01/2017 10:29 AM   Addum:  Random vancomycin level this am was obtained due to worsening renal function. Level was 15 mcg/ml.  Will continue vancomycin 1gm IV q24 hours and reevaluate in am  Excell Seltzer, PharmD

## 2017-09-01 NOTE — Progress Notes (Signed)
Subjective: He remains intubated and on mechanical ventilation.  He is still having pain requiring fentanyl.  He remains on amiodarone, Solu-Cortef, Versed, fentanyl, phenylephrine, Zosyn, Protonix and vancomycin.  Nursing staff reports that he seems to have a significant amount of pain.  Objective: Vital signs in last 24 hours: Temp:  [97.6 F (36.4 C)-98.7 F (37.1 C)] 97.7 F (36.5 C) (01/21 0400) Pulse Rate:  [32-136] 127 (01/21 0630) Resp:  [22-37] 34 (01/21 0630) BP: (62-129)/(41-96) 90/63 (01/21 0630) SpO2:  [96 %-100 %] 99 % (01/21 0630) FiO2 (%):  [55 %-65 %] 55 % (01/21 0500) Weight:  [103.8 kg (228 lb 13.4 oz)] 103.8 kg (228 lb 13.4 oz) (01/21 0500) Weight change: 3.1 kg (6 lb 13.4 oz) Last BM Date: 08/31/17  Intake/Output from previous day: 01/20 0701 - 01/21 0700 In: 2336 [I.V.:2186; IV Piggyback:150] Out: 660 [Urine:600; Emesis/NG output:10; Stool:50]  PHYSICAL EXAM General appearance: Intubated sedated on mechanical ventilation Resp: rhonchi bilaterally Cardio: irregularly irregular rhythm GI: Abdomen still distended.  Colostomy in place Extremities: Trace to 1+ lower extremity edema Throat is clear looks comfortable now  Lab Results:  Results for orders placed or performed during the hospital encounter of 08/28/17 (from the past 48 hour(s))  Blood gas, arterial     Status: Abnormal   Collection Time: 08/30/17  9:50 AM  Result Value Ref Range   FIO2 100.00    Delivery systems VENTILATOR    Mode PRESSURE REGULATED VOLUME CONTROL    VT 500 mL   LHR 20 resp/min   Peep/cpap 5.0 cm H20   pH, Arterial 7.292 (L) 7.350 - 7.450   pCO2 arterial 34.7 32.0 - 48.0 mmHg   pO2, Arterial 56.2 (L) 83.0 - 108.0 mmHg   Bicarbonate 17.3 (L) 20.0 - 28.0 mmol/L   Acid-base deficit 9.0 (H) 0.0 - 2.0 mmol/L   O2 Saturation 95.2 %   Collection site BRACHIAL ARTERY    Drawn by [id number]    Sample type ARTERIAL    Allens test (pass/fail) NOT INDICATED (A) PASS  Culture, blood  (Routine X 2) w Reflex to ID Panel     Status: None (Preliminary result)   Collection Time: 08/30/17 10:44 AM  Result Value Ref Range   Specimen Description LEFT ANTECUBITAL    Special Requests      BOTTLES DRAWN AEROBIC AND ANAEROBIC Blood Culture adequate volume   Culture  Setup Time PENDING    Culture NO GROWTH < 24 HOURS    Report Status PENDING   Procalcitonin - Baseline     Status: None   Collection Time: 08/30/17 10:44 AM  Result Value Ref Range   Procalcitonin 62.10 ng/mL    Comment:        Interpretation: PCT >= 10 ng/mL: Important systemic inflammatory response, almost exclusively due to severe bacterial sepsis or septic shock. (NOTE)       Sepsis PCT Algorithm           Lower Respiratory Tract                                      Infection PCT Algorithm    ----------------------------     ----------------------------         PCT < 0.25 ng/mL                PCT < 0.10 ng/mL         Strongly encourage  Strongly discourage   discontinuation of antibiotics    initiation of antibiotics    ----------------------------     -----------------------------       PCT 0.25 - 0.50 ng/mL            PCT 0.10 - 0.25 ng/mL               OR       >80% decrease in PCT            Discourage initiation of                                            antibiotics      Encourage discontinuation           of antibiotics    ----------------------------     -----------------------------         PCT >= 0.50 ng/mL              PCT 0.26 - 0.50 ng/mL                AND       <80% decrease in PCT             Encourage initiation of                                             antibiotics       Encourage continuation           of antibiotics    ----------------------------     -----------------------------        PCT >= 0.50 ng/mL                  PCT > 0.50 ng/mL               AND         increase in PCT                  Strongly encourage                                       initiation of antibiotics    Strongly encourage escalation           of antibiotics                                     -----------------------------                                           PCT <= 0.25 ng/mL                                                 OR                                        >  80% decrease in PCT                                     Discontinue / Do not initiate                                             antibiotics   Culture, blood (Routine X 2) w Reflex to ID Panel     Status: None (Preliminary result)   Collection Time: 08/30/17 10:45 AM  Result Value Ref Range   Specimen Description BLOOD LEFT HAND    Special Requests      BOTTLES DRAWN AEROBIC AND ANAEROBIC Blood Culture adequate volume   Culture NO GROWTH < 24 HOURS    Report Status PENDING   Lactic acid, plasma     Status: Abnormal   Collection Time: 08/30/17 11:02 AM  Result Value Ref Range   Lactic Acid, Venous 3.8 (HH) 0.5 - 1.9 mmol/L    Comment: CRITICAL RESULT CALLED TO, READ BACK BY AND VERIFIED WITH: HYLTON L. @ 1149 ON [id number] BY HENDERSON L.   Lactic acid, plasma     Status: Abnormal   Collection Time: 08/30/17  3:07 PM  Result Value Ref Range   Lactic Acid, Venous 2.5 (HH) 0.5 - 1.9 mmol/L    Comment: CRITICAL RESULT CALLED TO, READ BACK BY AND VERIFIED WITH: HOBBS A. @ 1603 ON [id number] BY HENDERSON L.   Lactic acid, plasma     Status: Abnormal   Collection Time: 08/30/17  6:15 PM  Result Value Ref Range   Lactic Acid, Venous 2.1 (HH) 0.5 - 1.9 mmol/L    Comment: CRITICAL RESULT CALLED TO, READ BACK BY AND VERIFIED WITH: HYLTON,L @ 1908 ON 08/30/17 BY JUW   Blood gas, arterial     Status: Abnormal   Collection Time: 08/31/17 12:30 AM  Result Value Ref Range   FIO2 100.00    Delivery systems VENTILATOR    Mode PRESSURE REGULATED VOLUME CONTROL    VT 390 mL   LHR 34 resp/min   Peep/cpap 10.0 cm H20   pH, Arterial 7.074 (LL) 7.350 - 7.450    Comment: CRITICAL RESULT CALLED TO,  READ BACK BY AND VERIFIED WITH: READ BACK TO CAMERON RN AT 4315 08/31/17 LANC RRT    pCO2 arterial 51.5 (H) 32.0 - 48.0 mmHg   pO2, Arterial 93.9 83.0 - 108.0 mmHg   Bicarbonate 12.6 (L) 20.0 - 28.0 mmol/L   Acid-base deficit 13.9 (H) 0.0 - 2.0 mmol/L   O2 Saturation 94.0 %   Collection site BRACHIAL ARTERY    Drawn by [id number]    Sample type ARTERIAL    Allens test (pass/fail) NOT INDICATED (A) PASS  Lactic acid, plasma     Status: Abnormal   Collection Time: 08/31/17  1:20 AM  Result Value Ref Range   Lactic Acid, Venous 2.8 (HH) 0.5 - 1.9 mmol/L    Comment: CRITICAL RESULT CALLED TO, READ BACK BY AND VERIFIED WITH: ACORD,B @ 0216 ON 08/31/17 BY JUW   CBC with Differential/Platelet     Status: Abnormal   Collection Time: 08/31/17  1:21 AM  Result Value Ref Range   WBC 34.3 (H) 4.0 - 10.5 K/uL   RBC 4.06 (L) 4.22 - 5.81 MIL/uL   Hemoglobin 12.9 (L)  13.0 - 17.0 g/dL   HCT 42.9 39.0 - 52.0 %   MCV 105.7 (H) 78.0 - 100.0 fL   MCH 31.8 26.0 - 34.0 pg   MCHC 30.1 30.0 - 36.0 g/dL   RDW 14.7 11.5 - 15.5 %   Platelets 165 150 - 400 K/uL   Neutrophils Relative % 94 %   Lymphocytes Relative 3 %   Monocytes Relative 3 %   Eosinophils Relative 0 %   Basophils Relative 0 %   Neutro Abs 32.3 (H) 1.7 - 7.7 K/uL   Lymphs Abs 1.0 0.7 - 4.0 K/uL   Monocytes Absolute 1.0 0.1 - 1.0 K/uL   Eosinophils Absolute 0.0 0.0 - 0.7 K/uL   Basophils Absolute 0.0 0.0 - 0.1 K/uL   WBC Morphology      MODERATE LEFT SHIFT (>5% METAS AND MYELOS,OCC PRO NOTED)    Comment: WHITE COUNT CONFIRMED ON SMEAR  Comprehensive metabolic panel     Status: Abnormal   Collection Time: 08/31/17  1:21 AM  Result Value Ref Range   Sodium 151 (H) 135 - 145 mmol/L   Potassium 6.3 (HH) 3.5 - 5.1 mmol/L    Comment: CRITICAL RESULT CALLED TO, READ BACK BY AND VERIFIED WITH: HOWARD,C @ 1610 ON 08/31/17 BY JUW    Chloride 124 (H) 101 - 111 mmol/L   CO2 16 (L) 22 - 32 mmol/L   Glucose, Bld 81 65 - 99 mg/dL   BUN 41 (H) 6 -  20 mg/dL   Creatinine, Ser 2.68 (H) 0.61 - 1.24 mg/dL   Calcium 8.0 (L) 8.9 - 10.3 mg/dL   Total Protein 6.1 (L) 6.5 - 8.1 g/dL   Albumin 2.5 (L) 3.5 - 5.0 g/dL   AST 44 (H) 15 - 41 U/L   ALT 30 17 - 63 U/L   Alkaline Phosphatase 73 38 - 126 U/L   Total Bilirubin 2.9 (H) 0.3 - 1.2 mg/dL   GFR calc non Af Amer 20 (L) >60 mL/min   GFR calc Af Amer 23 (L) >60 mL/min    Comment: (NOTE) The eGFR has been calculated using the CKD EPI equation. This calculation has not been validated in all clinical situations. eGFR's persistently <60 mL/min signify possible Chronic Kidney Disease.    Anion gap 11 5 - 15  Magnesium     Status: None   Collection Time: 08/31/17  1:21 AM  Result Value Ref Range   Magnesium 2.1 1.7 - 2.4 mg/dL  Phosphorus     Status: Abnormal   Collection Time: 08/31/17  1:21 AM  Result Value Ref Range   Phosphorus 7.0 (H) 2.5 - 4.6 mg/dL  Procalcitonin     Status: None   Collection Time: 08/31/17  3:49 AM  Result Value Ref Range   Procalcitonin 64.58 ng/mL    Comment:        Interpretation: PCT >= 10 ng/mL: Important systemic inflammatory response, almost exclusively due to severe bacterial sepsis or septic shock. (NOTE)       Sepsis PCT Algorithm           Lower Respiratory Tract                                      Infection PCT Algorithm    ----------------------------     ----------------------------         PCT < 0.25 ng/mL  PCT < 0.10 ng/mL         Strongly encourage             Strongly discourage   discontinuation of antibiotics    initiation of antibiotics    ----------------------------     -----------------------------       PCT 0.25 - 0.50 ng/mL            PCT 0.10 - 0.25 ng/mL               OR       >80% decrease in PCT            Discourage initiation of                                            antibiotics      Encourage discontinuation           of antibiotics    ----------------------------     -----------------------------          PCT >= 0.50 ng/mL              PCT 0.26 - 0.50 ng/mL                AND       <80% decrease in PCT             Encourage initiation of                                             antibiotics       Encourage continuation           of antibiotics    ----------------------------     -----------------------------        PCT >= 0.50 ng/mL                  PCT > 0.50 ng/mL               AND         increase in PCT                  Strongly encourage                                      initiation of antibiotics    Strongly encourage escalation           of antibiotics                                     -----------------------------                                           PCT <= 0.25 ng/mL                                                 OR                                        >  80% decrease in PCT                                     Discontinue / Do not initiate                                             antibiotics   Lactic acid, plasma     Status: Abnormal   Collection Time: 08/31/17  3:49 AM  Result Value Ref Range   Lactic Acid, Venous 2.9 (HH) 0.5 - 1.9 mmol/L    Comment: CRITICAL RESULT CALLED TO, READ BACK BY AND VERIFIED WITH: ACORD,B @ 0458 ON 08/31/17 BY JUW   Glucose, capillary     Status: None   Collection Time: 08/31/17  4:29 AM  Result Value Ref Range   Glucose-Capillary 91 65 - 99 mg/dL   Comment 1 Notify RN   Blood gas, arterial     Status: Abnormal   Collection Time: 08/31/17  5:30 AM  Result Value Ref Range   FIO2 65.00    Delivery systems VENTILATOR    Mode PRESSURE REGULATED VOLUME CONTROL    VT 500 mL   LHR 34 resp/min   Peep/cpap 10.0 cm H20   pH, Arterial 7.215 (L) 7.350 - 7.450   pCO2 arterial 41.6 32.0 - 48.0 mmHg   pO2, Arterial 82.6 (L) 83.0 - 108.0 mmHg   Bicarbonate 15.9 (L) 20.0 - 28.0 mmol/L   Acid-base deficit 10.2 (H) 0.0 - 2.0 mmol/L   O2 Saturation 95.3 %   Collection site RADIAL    Drawn by [id number]    Sample type ARTERIAL     Allens test (pass/fail) PASS PASS   Mechanical Rate 34   Glucose, capillary     Status: None   Collection Time: 08/31/17  7:36 AM  Result Value Ref Range   Glucose-Capillary 83 65 - 99 mg/dL  Vancomycin, random     Status: None   Collection Time: 08/31/17 10:45 AM  Result Value Ref Range   Vancomycin Rm 12     Comment:        Random Vancomycin therapeutic range is dependent on dosage and time of specimen collection. A peak range is 20.0-40.0 ug/mL A trough range is 5.0-15.0 ug/mL          Basic metabolic panel     Status: Abnormal   Collection Time: 08/31/17 10:45 AM  Result Value Ref Range   Sodium 147 (H) 135 - 145 mmol/L   Potassium 3.7 3.5 - 5.1 mmol/L    Comment: DELTA CHECK NOTED   Chloride 95 (L) 101 - 111 mmol/L   CO2 34 (H) 22 - 32 mmol/L   Glucose, Bld 220 (H) 65 - 99 mg/dL   BUN 38 (H) 6 - 20 mg/dL   Creatinine, Ser 2.16 (H) 0.61 - 1.24 mg/dL   Calcium 6.0 (LL) 8.9 - 10.3 mg/dL    Comment: CRITICAL RESULT CALLED TO, READ BACK BY AND VERIFIED WITH: JEFFERSON,V_0  BY MATTHEWS,B 1.20.19    GFR calc non Af Amer 26 (L) >60 mL/min   GFR calc Af Amer 30 (L) >60 mL/min    Comment: (NOTE) The eGFR has been calculated using the CKD EPI equation. This calculation has not been validated in all clinical situations. eGFR's persistently <60 mL/min signify  possible Chronic Kidney Disease.    Anion gap 18 (H) 5 - 15  Magnesium     Status: Abnormal   Collection Time: 08/31/17 10:45 AM  Result Value Ref Range   Magnesium 1.5 (L) 1.7 - 2.4 mg/dL  Glucose, capillary     Status: None   Collection Time: 08/31/17 11:30 AM  Result Value Ref Range   Glucose-Capillary 94 65 - 99 mg/dL  Glucose, capillary     Status: Abnormal   Collection Time: 08/31/17  4:23 PM  Result Value Ref Range   Glucose-Capillary 121 (H) 65 - 99 mg/dL  Glucose, capillary     Status: Abnormal   Collection Time: 08/31/17  7:40 PM  Result Value Ref Range   Glucose-Capillary 119 (H) 65 - 99 mg/dL   Glucose, capillary     Status: Abnormal   Collection Time: 08/31/17 11:54 PM  Result Value Ref Range   Glucose-Capillary 144 (H) 65 - 99 mg/dL   Comment 1 Notify RN   Procalcitonin     Status: None   Collection Time: 09/01/17  3:36 AM  Result Value Ref Range   Procalcitonin 42.92 ng/mL    Comment:        Interpretation: PCT >= 10 ng/mL: Important systemic inflammatory response, almost exclusively due to severe bacterial sepsis or septic shock. (NOTE)       Sepsis PCT Algorithm           Lower Respiratory Tract                                      Infection PCT Algorithm    ----------------------------     ----------------------------         PCT < 0.25 ng/mL                PCT < 0.10 ng/mL         Strongly encourage             Strongly discourage   discontinuation of antibiotics    initiation of antibiotics    ----------------------------     -----------------------------       PCT 0.25 - 0.50 ng/mL            PCT 0.10 - 0.25 ng/mL               OR       >80% decrease in PCT            Discourage initiation of                                            antibiotics      Encourage discontinuation           of antibiotics    ----------------------------     -----------------------------         PCT >= 0.50 ng/mL              PCT 0.26 - 0.50 ng/mL                AND       <80% decrease in PCT             Encourage initiation of  antibiotics       Encourage continuation           of antibiotics    ----------------------------     -----------------------------        PCT >= 0.50 ng/mL                  PCT > 0.50 ng/mL               AND         increase in PCT                  Strongly encourage                                      initiation of antibiotics    Strongly encourage escalation           of antibiotics                                     -----------------------------                                           PCT <= 0.25  ng/mL                                                 OR                                        > 80% decrease in PCT                                     Discontinue / Do not initiate                                             antibiotics   Comprehensive metabolic panel     Status: Abnormal   Collection Time: 09/01/17  3:36 AM  Result Value Ref Range   Sodium 145 135 - 145 mmol/L   Potassium 4.2 3.5 - 5.1 mmol/L   Chloride 117 (H) 101 - 111 mmol/L   CO2 18 (L) 22 - 32 mmol/L   Glucose, Bld 152 (H) 65 - 99 mg/dL   BUN 53 (H) 6 - 20 mg/dL   Creatinine, Ser 3.18 (H) 0.61 - 1.24 mg/dL    Comment: DELTA CHECK NOTED   Calcium 8.2 (L) 8.9 - 10.3 mg/dL   Total Protein 5.4 (L) 6.5 - 8.1 g/dL   Albumin 1.9 (L) 3.5 - 5.0 g/dL   AST 61 (H) 15 - 41 U/L   ALT 40 17 - 63 U/L   Alkaline Phosphatase 76 38 - 126 U/L   Total Bilirubin 2.2 (H) 0.3 - 1.2 mg/dL   GFR calc non Af Amer 16 (L) >60 mL/min   GFR  calc Af Amer 19 (L) >60 mL/min    Comment: (NOTE) The eGFR has been calculated using the CKD EPI equation. This calculation has not been validated in all clinical situations. eGFR's persistently <60 mL/min signify possible Chronic Kidney Disease.    Anion gap 10 5 - 15  Magnesium     Status: None   Collection Time: 09/01/17  3:36 AM  Result Value Ref Range   Magnesium 2.2 1.7 - 2.4 mg/dL  Glucose, capillary     Status: Abnormal   Collection Time: 09/01/17  3:37 AM  Result Value Ref Range   Glucose-Capillary 140 (H) 65 - 99 mg/dL   Comment 1 Notify RN   Blood gas, arterial     Status: Abnormal   Collection Time: 09/01/17  5:00 AM  Result Value Ref Range   FIO2 55.00    Delivery systems VENTILATOR    Mode PRESSURE REGULATED VOLUME CONTROL    VT 500 mL   LHR 34 resp/min   Peep/cpap 10.0 cm H20   pH, Arterial 7.359 7.350 - 7.450   pCO2 arterial 30.4 (L) 32.0 - 48.0 mmHg   pO2, Arterial 135.0 (H) 83.0 - 108.0 mmHg   Bicarbonate 18.6 (L) 20.0 - 28.0 mmol/L   Acid-base deficit 7.7 (H)  0.0 - 2.0 mmol/L   O2 Saturation 98.5 %   Collection site RADIAL    Drawn by [id number]    Sample type ARTERIAL    Allens test (pass/fail) PASS PASS   Mechanical Rate 34     ABGS Recent Labs    09/01/17 0500  PHART 7.359  PO2ART 135.0*  HCO3 18.6*   CULTURES Recent Results (from the past 240 hour(s))  MRSA PCR Screening     Status: Abnormal   Collection Time: 08/28/17  4:55 PM  Result Value Ref Range Status   MRSA by PCR POSITIVE (A) NEGATIVE Final    Comment: RESULT CALLED TO, READ BACK BY AND VERIFIED WITH: HOWERTON M. @ 8416 ON [id number]        The GeneXpert MRSA Assay (FDA approved for NASAL specimens only), is one component of a comprehensive MRSA colonization surveillance program. It is not intended to diagnose MRSA infection nor to guide or monitor treatment for MRSA infections.   Culture, Urine     Status: Abnormal   Collection Time: 08/29/17  7:26 AM  Result Value Ref Range Status   Specimen Description URINE, CLEAN CATCH  Final   Special Requests NONE  Final   Culture (A)  Final    <10,000 COLONIES/mL INSIGNIFICANT GROWTH Performed at [facility], [address] [place], [place] [address]    Report Status 08/30/2017 FINAL  Final  Culture, blood (Routine X 2) w Reflex to ID Panel     Status: None (Preliminary result)   Collection Time: 08/30/17 10:44 AM  Result Value Ref Range Status   Specimen Description LEFT ANTECUBITAL  Final   Special Requests   Final    BOTTLES DRAWN AEROBIC AND ANAEROBIC Blood Culture adequate volume   Culture  Setup Time PENDING  Incomplete   Culture NO GROWTH < 24 HOURS  Final   Report Status PENDING  Incomplete  Culture, blood (Routine X 2) w Reflex to ID Panel     Status: None (Preliminary result)   Collection Time: 08/30/17 10:45 AM  Result Value Ref Range Status   Specimen Description BLOOD LEFT HAND  Final   Special Requests   Final    BOTTLES DRAWN AEROBIC  AND ANAEROBIC Blood Culture adequate volume   Culture NO  GROWTH < 24 HOURS  Final   Report Status PENDING  Incomplete   Studies/Results: Dg Chest 1v Repeat Same Day  Result Date: 08/30/2017 CLINICAL DATA:  PICC line placement. EXAM: CHEST - 1 VIEW SAME DAY COMPARISON:  08/30/2017 FINDINGS: Endotracheal tube has been readjusted and now terminates 8.8 cm above the carina. Enteric catheter likely within the stomach. Left-sided PICC line terminates in the expected location of superior vena cava. Enlarged cardiac silhouette.  Mediastinal contours appear intact. There is no evidence of focal airspace consolidation, pleural effusion or pneumothorax. Mild peribronchial thickening. Small left pleural effusion. Osseous structures are without acute abnormality. Soft tissues are grossly normal. IMPRESSION: Left-sided PICC line terminates in the expected location of superior vena cava. No evidence of pneumothorax. Endotracheal tube terminates 8.8 cm above the carina. Advancement with approximately 4 cm may be considered. Low lung volumes with chronic bronchitic changes. Small left pleural effusion. Electronically Signed   By: Fidela Salisbury M.D.   On: 08/30/2017 15:26   Dg Chest Port 1 View  Result Date: 09/01/2017 CLINICAL DATA:  84 year old male with history of respiratory failure. History of colon cancer. EXAM: PORTABLE CHEST 1 VIEW COMPARISON:  Chest x-ray 08/31/2017. FINDINGS: An endotracheal tube is in place with tip 4.8 cm above the carina. There is a left upper extremity PICC with tip terminating in the proximal to mid superior vena cava. Lung volumes are low. Mild elevation of the right hemidiaphragm. Bibasilar opacities are favored to reflect areas of mild subsegmental atelectasis. There is cephalization of the pulmonary vasculature and slight indistinctness of the interstitial markings suggestive of mild pulmonary edema. Small bilateral pleural effusions. Mild cardiomegaly. The patient is rotated to the right on today's exam, resulting in distortion of the  mediastinal contours and reduced diagnostic sensitivity and specificity for mediastinal pathology. IMPRESSION: 1. Support apparatus, as above. 2. The appearance of the chest suggests mild congestive heart failure, as above. Electronically Signed   By: Vinnie Langton M.D.   On: 09/01/2017 07:01   Dg Chest Port 1 View  Result Date: 08/31/2017 CLINICAL DATA:  Intubated patient. EXAM: PORTABLE CHEST 1 VIEW COMPARISON:  08/30/2017 FINDINGS: Endotracheal tube is appropriately positioned above the carina. Nasogastric tube extends into the abdomen. Persistent densities at the left chest base. Left hemidiaphragm remains obscured. Mild fullness of the central vascular structures and interstitial markings. Left arm PICC tip is near the SVC. Negative for a pneumothorax. IMPRESSION: Persistent densities at the left lung base. Findings could represent a combination of consolidation and pleural fluid. No significant change. Electronically Signed   By: Markus Daft M.D.   On: 08/31/2017 08:34    Medications:  Prior to Admission:  Medications Prior to Admission  Medication Sig Dispense Refill Last Dose  . diltiazem (CARDIZEM CD) 240 MG 24 hr capsule Take 1 capsule (240 mg total) by mouth daily.   08/25/2017 at 0900  . latanoprost (XALATAN) 0.005 % ophthalmic solution Place 1 drop into both eyes at bedtime.    08/27/2017 at 1931  . pantoprazole (PROTONIX) 40 MG tablet Take 40 mg by mouth daily.   08/27/2017 at 0900  . timolol (BETIMOL) 0.25 % ophthalmic solution Place 1 drop into the left eye 2 (two) times daily.    08/28/2017 at 0927  . ciprofloxacin (CIPRO) 500 MG tablet Take 500 mg by mouth 2 (two) times daily.   Completed Course at Unknown time   Scheduled: . chlorhexidine gluconate (MEDLINE  KIT)  15 mL Mouth Rinse BID  . Chlorhexidine Gluconate Cloth  6 each Topical Daily  . hydrocortisone sod succinate (SOLU-CORTEF) inj  100 mg Intravenous Q8H  . latanoprost  1 drop Both Eyes QHS  . mouth rinse  15 mL Mouth  Rinse QID  . mupirocin ointment  1 application Nasal BID  . pantoprazole (PROTONIX) IV  40 mg Intravenous Q12H  . sodium chloride flush  10-40 mL Intracatheter Q12H  . timolol  1 drop Left Eye BID   Continuous: . amiodarone 30 mg/hr (08/31/17 1952)  . dextrose 5 % and 0.45% NaCl 100 mL/hr at 09/01/17 0103  . phenylephrine (NEO-SYNEPHRINE) Adult infusion 20 mcg/min (08/31/17 2249)  . piperacillin-tazobactam (ZOSYN)  IV 3.375 g (09/01/17 0549)  . vancomycin Stopped (08/31/17 1319)   TGY:BWLSLHTD (SUBLIMAZE) injection, fentaNYL (SUBLIMAZE) injection, midazolam, midazolam, ondansetron **OR** ondansetron (ZOFRAN) IV, sodium chloride flush  Assesment: He was admitted with small bowel obstruction.Marland Kitchen  He developed increasing problems with respiratory distress which culminated in him being intubated and placed on mechanical ventilation.  He apparently aspirated at that time.  He has been septic.  He is still requiring pressor support.  As far as his abdominal problems are concerned he seems to still be having problems with pain.  He has atrial fib with rapid ventricular response and is on amiodarone for that  He still has hypotension and is still requiring pressors  He has aspirated and is on Zosyn and vancomycin for that  He has acute respiratory failure I think related to the aspiration pneumonia and he remains on the ventilator.  Blood gas this morning is adequate  He has acute kidney injury and his creatinine is now 3.18  He has malnutrition with albumin of 1.9 but considering his abdominal problems we cannot start tube feedings     Principal Problem:   SBO (small bowel obstruction) (HCC) Active Problems:   SAH (subarachnoid hemorrhage) (HCC)   Hypokalemia   AF (paroxysmal atrial fibrillation) (HCC)   Leucocytosis   Essential hypertension   AKI (acute kidney injury) ([place])   Sepsis due to undetermined organism ([place])   Aspiration pneumonia of both lower lobes due to gastric  secretions (HCC)   Acute respiratory failure with hypoxia (HCC)   Sepsis ([place])   Endotracheal tube present   Hyperkalemia   Hydronephrosis    Plan: Continue treatments.  Family apparently considering transition to comfort care    LOS: 4 days   Dolton Shaker L 09/01/2017, 7:32 AM

## 2017-09-01 NOTE — Progress Notes (Signed)
PROGRESS NOTE  Joel Bishop QJJ:[id number] DOB: [birth year] DOA: 08/28/2017 PCP: Rosita Fire, MD Brief History: 84 year old male with a history of colon cancer status post colostomy 1998, paroxysmal atrial fibrillation, hypertension, and recent subarachnoid hemorrhage presenting with nearly 1 week history of intermittent abdominal fullness. The patient was recently admitted to the hospital from 08/20/17 through 08/23/17 secondary to a subarachnoid hemorrhage resulting from a fall at home. As result, the patient was taken off of his warfarin after INR reversal. The patient also was treated for UTI with ciprofloxacin which she finished on 08/26/2017. He presented to the emergency department 08/24/2017 with nausea, vomiting, and abdominal fullness. Abdominal x-ray Suggests inability to rule out underlying bowel obstruction but since Patient's symptoms had resolved in the ED aith normal abdominal exam and Patient tolerating diet without any problem he was discharged back to SNF. He was then seen by physician at the facility next day and again at that time he did not have any complaint and was tolerating diet without a problem.Since then, the patient has had intermittent abdominal fullness and nausea. On the morning of 08/28/2017, the patient had worsening abdominal pain associated with nausea and vomiting. He also reported low colostomy output times 2 days. CT of the abdomen and pelvis in the emergency department showed small bowel dilatation up to 5 cm with air-fluid levels with a transition zone in the lower abdomen and pelvis. In the evening of 08/29/2017, the patient had an episode of emesis after which he developed respiratory distress. He subsequently required intubation.He subsequently became hypotensive requiring aggressive fluid resuscitation.  The patient was subsequently placed on Neo-Synephrine which required a continued upward titration to maintain SBP greater than 90.  His  antibiotic coverage was broadened to include vancomycin and Zosyn.  Pulmonary medicine was consulted to assist with vent management.  The patient remained critically ill through the next 3 days after intubation.  There was no significant improvement in his clinical condition.  Palliative medicine was consulted to assist.  After discussion with the patient's sister (POA), it was initially decided upon to transition the patient's focus of care to focus on full comfort.  Subsequently, the patient was extubated under compassionate means.     Assessment/Plan: ID Sepsis -Secondary to aspiration pneumonia, but cannot rule out bacteremia -concerned about ischemic bowel  -Check procalcitonin--64.58 -Lactic acid peaked 3.8 -Urinalysis negative for pyuria -1/20-Personally reviewed chest x-ray--vagueLLL opacity -Continue vancomycin and Zosyn pending culture data -remains hypotensive with phenylephrine at 20-30 mcg/kg/min -blood cultures neg -d/c vancomycin, continue zosyn -no significant improvement in his clinical condition.  Palliative medicine was consulted to assist.  After discussion with the patient's sister (POA), it was initially decided upon to transition the patient's focus of care to focus on full comfort.   Aspiration pneumonia -Zosyn as discussed -remains intubated with FiO2 40%  PULM Acute respiratory failure with hypoxia -Secondary to aspiration pneumonia -Intubated and mechanically ventilated -FiO2 at 40%, AC -Appreciate Dr. Luan Pulling consult -no attempts at weaning presently -Personally reviewed chest x-ray--vagueLLL opacity  GI Small bowel obstruction -Continue IV fluids -Remain n.p.o. -General surgery consult--not a surgical candiate -+stool in ostomy -Continue bowel rest -concerned about ischemic bowel given increased abd pain, but remains very poor surgical candidate  Blood in NG -increase PPI to bid -not a candidate for endoscopy  Cholelithiasis -abd  US--GB neck stone -discussed with Dr. Blanchie Serve a surgical candidate, no sign of cholecystitis or cholangitis -continue zosyn  CARD Atrial  fibrillation with RVR -d/c diltiazem due to hypotension -continue amiodarone -HR 120-130 -Warfarin discontinued recently secondary to subarachnoid hemorrhage  RENAL AKI/Hydronephrosis -Likely secondary to volume depletion/ATN/Sepsis -Continue IV fluids switch to 1/2NS -Abdominal ultrasound--bilateral moderate hydronephrosis; cholelithiasis with GB neck stone without GB wall thickening or ductal dilatation -hydronephrosis likely due to urine retention -appreciate urology consult -discussed with urology, Dr. Jillene Bucks candidate for stents or nephrostomy tubes--risks outweigh benefit -renal function continues to worsen   FEN Metabolic Acidosis -due to worsening renal failure -concerned about bowel ischemia -unstable presently to perform CT abd  Hypernatremia -start D51/2NS  Hyperkalemia -treated -repeat BMP  Essential hypertension -now hypotensive  NEURO Recent subarachnoid hemorrhage -Anticoagulation discontinued indefinitely  GOC -DNR -overall poor prognosis - Palliative medicine was consulted to assist.  After discussion with the patient's sister (POA), it was initially decided upon to transition the patient's focus of care to focus on full comfort.     Disposition Plan:critically ill--remain in ICU Family Communication:Sister (POA) updated--DNR  Consultants:General surgery, PULM  Code Status: DNR   DVT Prophylaxis: SCDs   Procedures: As Listed in Progress Note Above  Antibiotics: Zosyn 1/19>> vanco 1/19>>  The patient is critically ill with multiple organ systems failure and requires high complexity decision making for assessment and support, frequent evaluation and titration of therapies, application of advanced monitoring technologies and extensive interpretation of  multiple databases.  Critical care time -35 mins.       Subjective: Patient remains sedated on mechanical ventilator.  There is no reports of vomiting, respiratory distress, uncontrolled pain.  Objective: Vitals:   09/01/17 1330 09/01/17 1400 09/01/17 1415 09/01/17 1508  BP: 93/69 97/72 116/76   Pulse: (!) 109 64 (!) 58   Resp: (!) 34 (!) 34 (!) 34   Temp:      TempSrc:      SpO2: 99% 100% 100% 96%  Weight:      Height:        Intake/Output Summary (Last 24 hours) at 09/01/2017 1728 Last data filed at 09/01/2017 1026 Gross per 24 hour  Intake 2353 ml  Output 610 ml  Net 1743 ml   Weight change: 3.1 kg (6 lb 13.4 oz) Exam:   General:  Pt is alert, follows commands appropriately, not in acute distress  HEENT: No icterus, No thrush, No neck mass, [place]/AT  Cardiovascular: RRR, S1/S2, no rubs, no gallops  Respiratory: Scattered rales.  No wheezing.  Abdomen: Soft/+BS, non tender, non distended, no guarding+stool in ostomy  Extremities: 2 + LE edema, No lymphangitis, No petechiae, No rashes, no synovitis   Data Reviewed: I have personally reviewed following labs and imaging studies Basic Metabolic Panel: Recent Labs  Lab 08/28/17 1014 08/29/17 0444 08/30/17 0437 08/31/17 0121 08/31/17 1045 09/01/17 0336  NA  --  144 148* 151* 147* 145  K  --  4.5 4.5 6.3* 3.7 4.2  CL  --  111 115* 124* 95* 117*  CO2  --  24 20* 16* 34* 18*  GLUCOSE  --  98 85 81 220* 152*  BUN  --  25* 37* 41* 38* 53*  CREATININE  --  1.60* 2.23* 2.68* 2.16* 3.18*  CALCIUM  --  8.8* 8.5* 8.0* 6.0* 8.2*  MG 2.4  --   --  2.1 1.5* 2.2  PHOS  --   --   --  7.0*  --   --    Liver Function Tests: Recent Labs  Lab 08/28/17 1009 08/31/17 0121 09/01/17 0336  AST 57*  44* 61*  ALT 52 30 40  ALKPHOS 116 73 76  BILITOT 1.2 2.9* 2.2*  PROT 8.3* 6.1* 5.4*  ALBUMIN 3.2* 2.5* 1.9*   Recent Labs  Lab 08/28/17 1009  LIPASE 22   No results for input(s): AMMONIA in the last 168  hours. Coagulation Profile: No results for input(s): INR, PROTIME in the last 168 hours. CBC: Recent Labs  Lab 08/28/17 1009 08/29/17 0444 08/30/17 0437 08/31/17 0121 09/01/17 0336  WBC 19.1* 19.3* 30.9* 34.3* 28.3*  NEUTROABS 15.4*  --   --  32.3*  --   HGB 14.8 14.5 15.5 12.9* 11.8*  HCT 44.9 45.8 48.5 42.9 37.3*  MCV 96.8 99.6 102.3* 105.7* 100.0  PLT 270 266 193 165 124*   Cardiac Enzymes: No results for input(s): CKTOTAL, CKMB, CKMBINDEX, TROPONINI in the last 168 hours. BNP: Invalid input(s): POCBNP CBG: Recent Labs  Lab 08/31/17 1940 08/31/17 2354 09/01/17 0337 09/01/17 0740 09/01/17 1130  GLUCAP 119* 144* 140* 140* 150*   HbA1C: No results for input(s): HGBA1C in the last 72 hours. Urine analysis:    Component Value Date/Time   COLORURINE YELLOW 08/29/2017 0726   APPEARANCEUR CLEAR 08/29/2017 0726   LABSPEC 1.017 08/29/2017 0726   PHURINE 5.0 08/29/2017 0726   GLUCOSEU NEGATIVE 08/29/2017 0726   HGBUR MODERATE (A) 08/29/2017 0726   BILIRUBINUR NEGATIVE 08/29/2017 0726   KETONESUR NEGATIVE 08/29/2017 0726   PROTEINUR 30 (A) 08/29/2017 0726   NITRITE NEGATIVE 08/29/2017 0726   LEUKOCYTESUR NEGATIVE 08/29/2017 0726   Sepsis Labs: '@LABRCNTIP'$ (procalcitonin:4,lacticidven:4) ) Recent Results (from the past 240 hour(s))  MRSA PCR Screening     Status: Abnormal   Collection Time: 08/28/17  4:55 PM  Result Value Ref Range Status   MRSA by PCR POSITIVE (A) NEGATIVE Final    Comment: RESULT CALLED TO, READ BACK BY AND VERIFIED WITH: HOWERTON M. @ 1830 ON [id number]        The GeneXpert MRSA Assay (FDA approved for NASAL specimens only), is one component of a comprehensive MRSA colonization surveillance program. It is not intended to diagnose MRSA infection nor to guide or monitor treatment for MRSA infections.   Culture, Urine     Status: Abnormal   Collection Time: 08/29/17  7:26 AM  Result Value Ref Range Status   Specimen Description URINE, CLEAN  CATCH  Final   Special Requests NONE  Final   Culture (A)  Final    <10,000 COLONIES/mL INSIGNIFICANT GROWTH Performed at [facility], [address], [place], [place] [address]    Report Status 08/30/2017 FINAL  Final  Culture, blood (Routine X 2) w Reflex to ID Panel     Status: None (Preliminary result)   Collection Time: 08/30/17 10:44 AM  Result Value Ref Range Status   Specimen Description LEFT ANTECUBITAL  Final   Special Requests   Final    BOTTLES DRAWN AEROBIC AND ANAEROBIC Blood Culture adequate volume   Culture  Setup Time PENDING  Incomplete   Culture NO GROWTH 2 DAYS  Final   Report Status PENDING  Incomplete  Culture, blood (Routine X 2) w Reflex to ID Panel     Status: None (Preliminary result)   Collection Time: 08/30/17 10:45 AM  Result Value Ref Range Status   Specimen Description BLOOD LEFT HAND  Final   Special Requests   Final    BOTTLES DRAWN AEROBIC AND ANAEROBIC Blood Culture adequate volume   Culture NO GROWTH 2 DAYS  Final   Report  Status PENDING  Incomplete     Scheduled Meds: . chlorhexidine gluconate (MEDLINE KIT)  15 mL Mouth Rinse BID  . Chlorhexidine Gluconate Cloth  6 each Topical Daily  . mouth rinse  15 mL Mouth Rinse QID  . mupirocin ointment  1 application Nasal BID  . sodium chloride flush  10-40 mL Intracatheter Q12H   Continuous Infusions:  Procedures/Studies: Dg Chest 1 View  Result Date: 08/20/2017 CLINICAL DATA:  Generalized weakness EXAM: CHEST 1 VIEW COMPARISON:  None. FINDINGS: Cardiomegaly and vascular pedicle widening. Congested appearance of vessels without Kerley lines or effusion. No pneumothorax or air bronchogram. Artifact from EKG leads. IMPRESSION: Cardiomegaly and vascular congestion accentuated by low volumes. Electronically Signed   By: Monte Fantasia M.D.   On: 08/20/2017 20:04   Ct Head Wo Contrast  Result Date: 08/21/2017 CLINICAL DATA:  [place] yesterday, subarachnoid hemorrhage, followup EXAM: CT HEAD  WITHOUT CONTRAST TECHNIQUE: Contiguous axial images were obtained from the base of the skull through the vertex without intravenous contrast. Sagittal and coronal MPR images reconstructed from axial data set. COMPARISON:  08/20/2017 FINDINGS: Brain: Generalized atrophy. Prominent ventricular system unchanged. No midline shift or mass effect. Small vessel chronic ischemic changes of deep cerebral white matter. Again identified small amount of high attenuation acute subarachnoid hemorrhage at the interhemispheric fissure, unchanged. No new areas of intracranial hemorrhage, mass lesion, or acute infarction. No new extra-axial collections. Vascular: Atherosclerotic calcifications of internal carotid arteries at skull base Skull: Intact though demineralized Sinuses/Orbits: Clear Other: N/A IMPRESSION: Stable appearance of a small amount of acute subarachnoid hemorrhage at the interhemispheric fissure anteriorly. Atrophy with small vessel chronic ischemic changes of deep cerebral white matter. No new intracranial abnormalities. Electronically Signed   By: Lavonia Dana M.D.   On: 08/21/2017 16:20   Ct Head Wo Contrast  Result Date: 08/20/2017 CLINICAL DATA:  84 year old male with history of confusion today. Swelling in both legs. EXAM: CT HEAD WITHOUT CONTRAST TECHNIQUE: Contiguous axial images were obtained from the base of the skull through the vertex without intravenous contrast. COMPARISON:  No priors. FINDINGS: Brain: Small amount of high attenuation in the anterior aspect of the interhemispheric fissure best appreciated on axial images 20 and 21 and coronal images 25 through 29. Patchy and confluent areas of decreased attenuation are noted throughout the deep and periventricular white matter of the cerebral hemispheres bilaterally, compatible with chronic microvascular ischemic disease. Moderate to severe cerebral atrophy with associated ex vacuo dilatation of the ventricular system. No evidence of acute  infarction, mass or mass effect. Vascular: No hyperdense vessel or unexpected calcification. Skull: Normal. Negative for fracture or focal lesion. Sinuses/Orbits: No acute finding. Other: None. IMPRESSION: 1. Small amount of subarachnoid hemorrhage noted in the anterior interhemispheric fissure. 2. Moderate to severe cerebral atrophy with probable ex vacuo dilatation of the ventricular system. The possibility of normal pressure hydrocephalus (NPH) is not entirely excluded. 3. Extensive chronic microvascular ischemic changes throughout the cerebral white matter, as above. Critical Value/emergent results were called by telephone at the time of interpretation on 08/20/2017 at 8:23 pm to Dr. Nat Christen, who verbally acknowledged these results. Electronically Signed   By: Vinnie Langton M.D.   On: 08/20/2017 20:23   US Abdomen Complete  Result Date: 08/29/2017 CLINICAL DATA:  Elevated liver function tests. Acute renal insufficiency. EXAM: ABDOMEN ULTRASOUND COMPLETE COMPARISON:  Abdominal CT 08/28/2017 FINDINGS: Gallbladder: The gallbladder wall is normally distended. There is a calculus measuring 1.2 cm in the neck of the gallbladder without  appreciable gallbladder wall thickening. The gallbladder wall measures 1.8 mm. No pericholecystic fluid. Common bile duct: Diameter: 6.2 mm Liver: No focal lesion identified. Coarse parenchymal echogenicity. Portal vein is patent on color Doppler imaging with normal direction of blood flow towards the liver. IVC: Limited visualization without focal abnormality. Pancreas: Not seen. Spleen: Size and appearance within normal limits. Right Kidney: Length: 10.1 cm. Echogenicity within normal limits. Moderate hydronephrosis visualized. Left Kidney: Length: 11.8 cm. Echogenicity within normal limits. Moderate hydronephrosis visualized. Abdominal aorta: Mid to distal aorta is not seen. No aneurysmal dilation of the proximal aorta. Other findings: None. IMPRESSION: Limited visualization  due to ongoing small bowel obstruction. Cholelithiasis without evidence of acute cholecystitis. Bilateral moderate hydronephrosis. Electronically Signed   By: Fidela Salisbury M.D.   On: 08/29/2017 10:33   Ct Abdomen Pelvis W Contrast  Result Date: 08/28/2017 CLINICAL DATA:  Decreased urine output. Small bowel obstruction. History of prostatectomy and colostomy in 1998 EXAM: CT ABDOMEN AND PELVIS WITH CONTRAST TECHNIQUE: Multidetector CT imaging of the abdomen and pelvis was performed using the standard protocol following bolus administration of intravenous contrast. CONTRAST:  121m ISOVUE-300 IOPAMIDOL (ISOVUE-300) INJECTION 61% COMPARISON:  None. FINDINGS: Lower chest: Small left pleural effusion. Left basilar atelectasis. Mild right basilar atelectasis. Elevation of the right diaphragm. Hepatobiliary: No focal hepatic mass. Dystrophic calcification in the right hepatic lobe adjacent to the middle portal vein. Normal gallbladder. No intrahepatic or extrahepatic biliary ductal dilatation. Pancreas: Unremarkable. No pancreatic ductal dilatation or surrounding inflammatory changes. Spleen: Normal in size without focal abnormality. Adrenals/Urinary Tract: Adrenal glands are unremarkable. Kidneys are normal, without renal calculi, focal lesion, or hydronephrosis. Multiple bladder calculi are noted. Stomach/Bowel: Severe small bowel dilatation measuring up to 5 cm in diameter with multiple air-fluid levels with a relative transition zone where there appears to be tethering in the lower abdomen/pelvis likely secondary to adhesions. Distal small bowel is decompressed. Colon is decompressed. No pneumatosis, pneumoperitoneum or portal venous gas. Left lower quadrant colostomy. Vascular/Lymphatic: Abdominal aortic atherosclerosis. Normal caliber abdominal aorta. No lymphadenopathy. Reproductive: Prior prostatectomy. Other: Left inguinal fat containing hernia. Ventral wide-mouth abdominal hernia containing transverse  colon without obstruction. Presacral soft tissue prominence likely related to posttreatment changes. Musculoskeletal: No acute osseous abnormality. No aggressive osseous lesion. Ankylosis of bilateral sacroiliac joints. Degenerative disc disease with disc height loss at L3-4, L4-5 and L5-S1 with bilateral facet arthropathy. IMPRESSION: 1. Severe small bowel obstruction with a relative transition zone in the lower abdomen/pelvis likely secondary to adhesions. Small bowel measures up to 5 cm in diameter. 2.  Aortic Atherosclerosis (ICD10-I70.0). 3. 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 4.  Left lower quadrant colostomy. 5. Multiple bladder calculi. 6. Small left pleural effusion. Electronically Signed   By: HKathreen Devoid  On: 08/28/2017 12:12   Dg Chest 1v Repeat Same Day  Result Date: 08/30/2017 CLINICAL DATA:  PICC line placement. EXAM: CHEST - 1 VIEW SAME DAY COMPARISON:  08/30/2017 FINDINGS: Endotracheal tube has been readjusted and now terminates 8.8 cm above the carina. Enteric catheter likely within the stomach. Left-sided PICC line terminates in the expected location of superior vena cava. Enlarged cardiac silhouette.  Mediastinal contours appear intact. There is no evidence of focal airspace consolidation, pleural effusion or pneumothorax. Mild peribronchial thickening. Small left pleural effusion. Osseous structures are without acute abnormality. Soft tissues are grossly normal. IMPRESSION: Left-sided PICC line terminates in the expected location of superior vena cava. No evidence of pneumothorax. Endotracheal tube terminates 8.8 cm above  the carina. Advancement with approximately 4 cm may be considered. Low lung volumes with chronic bronchitic changes. Small left pleural effusion. Electronically Signed   By: Fidela Salisbury M.D.   On: 08/30/2017 15:26   Dg Chest Port 1 View  Result Date: 09/01/2017 CLINICAL DATA:  84 year old male with history of  respiratory failure. History of colon cancer. EXAM: PORTABLE CHEST 1 VIEW COMPARISON:  Chest x-ray 08/31/2017. FINDINGS: An endotracheal tube is in place with tip 4.8 cm above the carina. There is a left upper extremity PICC with tip terminating in the proximal to mid superior vena cava. Lung volumes are low. Mild elevation of the right hemidiaphragm. Bibasilar opacities are favored to reflect areas of mild subsegmental atelectasis. There is cephalization of the pulmonary vasculature and slight indistinctness of the interstitial markings suggestive of mild pulmonary edema. Small bilateral pleural effusions. Mild cardiomegaly. The patient is rotated to the right on today's exam, resulting in distortion of the mediastinal contours and reduced diagnostic sensitivity and specificity for mediastinal pathology. IMPRESSION: 1. Support apparatus, as above. 2. The appearance of the chest suggests mild congestive heart failure, as above. Electronically Signed   By: Vinnie Langton M.D.   On: 09/01/2017 07:01   Dg Chest Port 1 View  Result Date: 08/31/2017 CLINICAL DATA:  Intubated patient. EXAM: PORTABLE CHEST 1 VIEW COMPARISON:  08/30/2017 FINDINGS: Endotracheal tube is appropriately positioned above the carina. Nasogastric tube extends into the abdomen. Persistent densities at the left chest base. Left hemidiaphragm remains obscured. Mild fullness of the central vascular structures and interstitial markings. Left arm PICC tip is near the SVC. Negative for a pneumothorax. IMPRESSION: Persistent densities at the left lung base. Findings could represent a combination of consolidation and pleural fluid. No significant change. Electronically Signed   By: Markus Daft M.D.   On: 08/31/2017 08:34   Portable Chest X-ray  Result Date: 08/30/2017 CLINICAL DATA:  84 year old male status post intubation. EXAM: PORTABLE CHEST 1 VIEW COMPARISON:  Chest radiograph dated 08/29/2017. FINDINGS: There has been interval placement of an  endotracheal tube with tip approximately at the level of the carina. Recommend retraction of the tube by approximately 3-4 cm. Enteric tube extends below the diaphragm with side port in the left upper abdomen and tip beyond the inferior margin of the image. There is shallow inspiration with bibasilar atelectatic changes. A small left pleural effusion may be present. Pneumonia is not excluded. There is no pneumothorax. Stable mild cardiomegaly. No acute osseous pathology. IMPRESSION: 1. Endotracheal tube at the level of the carina. Recommend retraction by approximately 3-4 cm for optimal positioning. 2. Shallow inspiration with bibasilar atelectasis. Possible small left pleural effusion. These results were called by telephone at the time of interpretation on 08/30/2017 at 2:03 am to Dr. Olevia Bowens, who verbally acknowledged these results. Electronically Signed   By: Anner Crete M.D.   On: 08/30/2017 02:17   Dg Chest Port 1 View  Result Date: 08/29/2017 CLINICAL DATA:  NG tube placement EXAM: PORTABLE CHEST 1 VIEW COMPARISON:  08/29/2017 FINDINGS: Cardiomegaly with vascular congestion. Small left effusion and patchy basilar atelectasis or infiltrate on the left. Esophageal tube tip extends below diaphragm but tip is incompletely included. IMPRESSION: 1. Esophageal tube tip is below the diaphragm but incompletely included on the current image. 2. Cardiomegaly with vascular congestion. Patchy atelectasis or infiltrate at the left base Electronically Signed   By: Donavan Foil M.D.   On: 08/29/2017 23:26   Dg Chest Port 1 View  Result Date:  08/29/2017 CLINICAL DATA:  Sudden onset shortness of breath EXAM: PORTABLE CHEST 1 VIEW COMPARISON:  08/24/2017, 08/20/2017 FINDINGS: Cardiomegaly with vascular congestion and mild interstitial opacities suggesting edema. Patchy atelectasis or infiltrate at the left base. No pneumothorax. IMPRESSION: 1. Cardiomegaly with vascular congestion and mild interstitial opacity  suggesting minimal edema. 2. Patchy atelectasis versus minimal infiltrate at the left base. Electronically Signed   By: Donavan Foil M.D.   On: 08/29/2017 23:16   Dg Abd Acute W/chest  Result Date: 08/24/2017 CLINICAL DATA:  Nausea, vomiting, abdominal pain EXAM: DG ABDOMEN ACUTE W/ 1V CHEST COMPARISON:  08/20/2017 FINDINGS: Cardiomegaly.  Bibasilar atelectasis.  No effusions. Mildly prominent small bowel loops in the mid abdomen. Gas is noted within decompressed colon. Cannot exclude partial small bowel obstruction. No free air organomegaly. IMPRESSION: Dilated small bowel loops in the mid abdomen. Cannot exclude partial small bowel obstruction. Cardiomegaly, bibasilar atelectasis. Electronically Signed   By: Rolm Baptise M.D.   On: 08/24/2017 14:53   Dg Abd Portable 2v  Result Date: 08/29/2017 CLINICAL DATA:  Shortness of breath, history of colon cancer EXAM: PORTABLE ABDOMEN - 2 VIEW COMPARISON:  CT abdomen 08/28/2017 FINDINGS: There is persistent severe dilatation of the small bowel. There is no evidence of pneumoperitoneum, portal venous gas or pneumatosis. There are no pathologic calcifications along the expected course of the ureters. There is contrast in the bladder from recent IV contrast injection. There is stable cardiomegaly. There elevation of the right diaphragm. The osseous structures are unremarkable. IMPRESSION: Persistent small-bowel obstruction. Electronically Signed   By: Kathreen Devoid   On: 08/29/2017 09:47    Orson Eva, DO  [facility] Pager [phone]  If 7PM-7AM, please contact night-coverage [email or web address] Password [email or web address] 09/01/2017, 5:28 PM   LOS: 4 days

## 2017-09-02 ENCOUNTER — Inpatient Hospital Stay (HOSPITAL_COMMUNITY)
Admission: EM | Admit: 2017-09-02 | Discharge: 2017-09-12 | DRG: 871 | Disposition: E | Source: Ambulatory Visit | Attending: Internal Medicine | Admitting: Internal Medicine

## 2017-09-02 DIAGNOSIS — Z515 Encounter for palliative care: Secondary | ICD-10-CM

## 2017-09-02 DIAGNOSIS — R601 Generalized edema: Secondary | ICD-10-CM | POA: Diagnosis not present

## 2017-09-02 DIAGNOSIS — J690 Pneumonitis due to inhalation of food and vomit: Secondary | ICD-10-CM | POA: Diagnosis present

## 2017-09-02 DIAGNOSIS — Z85038 Personal history of other malignant neoplasm of large intestine: Secondary | ICD-10-CM | POA: Diagnosis not present

## 2017-09-02 DIAGNOSIS — A419 Sepsis, unspecified organism: Secondary | ICD-10-CM | POA: Diagnosis not present

## 2017-09-02 DIAGNOSIS — K56609 Unspecified intestinal obstruction, unspecified as to partial versus complete obstruction: Secondary | ICD-10-CM | POA: Diagnosis not present

## 2017-09-02 DIAGNOSIS — N179 Acute kidney failure, unspecified: Secondary | ICD-10-CM | POA: Diagnosis not present

## 2017-09-02 DIAGNOSIS — I129 Hypertensive chronic kidney disease with stage 1 through stage 4 chronic kidney disease, or unspecified chronic kidney disease: Secondary | ICD-10-CM | POA: Diagnosis present

## 2017-09-02 DIAGNOSIS — I10 Essential (primary) hypertension: Secondary | ICD-10-CM | POA: Diagnosis not present

## 2017-09-02 DIAGNOSIS — N1339 Other hydronephrosis: Secondary | ICD-10-CM | POA: Diagnosis not present

## 2017-09-02 DIAGNOSIS — R6521 Severe sepsis with septic shock: Secondary | ICD-10-CM | POA: Diagnosis present

## 2017-09-02 DIAGNOSIS — N189 Chronic kidney disease, unspecified: Secondary | ICD-10-CM | POA: Diagnosis present

## 2017-09-02 DIAGNOSIS — Z7189 Other specified counseling: Secondary | ICD-10-CM | POA: Diagnosis not present

## 2017-09-02 DIAGNOSIS — Z66 Do not resuscitate: Secondary | ICD-10-CM | POA: Diagnosis present

## 2017-09-02 DIAGNOSIS — R531 Weakness: Secondary | ICD-10-CM | POA: Diagnosis not present

## 2017-09-02 DIAGNOSIS — E875 Hyperkalemia: Secondary | ICD-10-CM | POA: Diagnosis not present

## 2017-09-02 DIAGNOSIS — Z933 Colostomy status: Secondary | ICD-10-CM | POA: Diagnosis not present

## 2017-09-02 DIAGNOSIS — J9601 Acute respiratory failure with hypoxia: Secondary | ICD-10-CM | POA: Diagnosis present

## 2017-09-02 DIAGNOSIS — R630 Anorexia: Secondary | ICD-10-CM | POA: Diagnosis not present

## 2017-09-02 DIAGNOSIS — H409 Unspecified glaucoma: Secondary | ICD-10-CM | POA: Diagnosis not present

## 2017-09-02 DIAGNOSIS — E876 Hypokalemia: Secondary | ICD-10-CM | POA: Diagnosis not present

## 2017-09-02 DIAGNOSIS — I482 Chronic atrial fibrillation: Secondary | ICD-10-CM | POA: Diagnosis not present

## 2017-09-02 DIAGNOSIS — I480 Paroxysmal atrial fibrillation: Secondary | ICD-10-CM | POA: Diagnosis not present

## 2017-09-02 MED ORDER — LORAZEPAM 2 MG/ML IJ SOLN: 1.0000 mg | INTRAMUSCULAR | 0 refills | Status: AC | PRN

## 2017-09-02 MED ORDER — MORPHINE SULFATE (CONCENTRATE) 10 MG/0.5ML PO SOLN
10.0000 mg | ORAL | Status: DC
  Administered 2017-09-02 – 2017-09-03 (×4): 10 mg via ORAL
  Filled 2017-09-02 (×3): qty 0.5

## 2017-09-02 MED ORDER — LORAZEPAM 2 MG/ML IJ SOLN
1.0000 mg | INTRAMUSCULAR | Status: DC | PRN
  Administered 2017-09-02: 2 mg via INTRAVENOUS
  Filled 2017-09-02: qty 1

## 2017-09-02 MED ORDER — MORPHINE SULFATE (CONCENTRATE) 10 MG/0.5ML PO SOLN
5.0000 mg | ORAL | Status: DC
  Administered 2017-09-02 (×2): 5 mg via ORAL
  Filled 2017-09-02 (×2): qty 0.5

## 2017-09-02 MED ORDER — MORPHINE SULFATE (CONCENTRATE) 10 MG/0.5ML PO SOLN
5.0000 mg | ORAL | Status: DC | PRN
  Administered 2017-09-02 (×2): 5 mg via ORAL
  Filled 2017-09-02 (×3): qty 0.5

## 2017-09-02 MED ORDER — MORPHINE SULFATE (CONCENTRATE) 10 MG/0.5ML PO SOLN: 10.0000 mg | ORAL | 0 refills | Status: AC

## 2017-09-02 MED ORDER — MORPHINE SULFATE (CONCENTRATE) 10 MG/0.5ML PO SOLN: 5.0000 mg | ORAL | 0 refills | Status: AC | PRN

## 2017-09-02 NOTE — Care Management Note (Signed)
Case Management Note  Patient Details  Name: Joel Bishop MRN: [id number] Date of Birth: Oct 03, 1927   If discussed at [place] Length of Stay Meetings, dates discussed:  08/24/2017  Additional Comments:  Chadwin Fury, Chauncey Reading, RN 08/20/2017, 12:05 PM

## 2017-09-02 NOTE — H&P (Signed)
History and Physical  Joel Bishop DXI:[id number] DOB: October 04, 1927 DOA: 08/28/2017   PCP: Rosita Fire, MD   Patient coming from: Home  Chief Complaint: full comfort residential hospice  HPI:  84 year old male with a history of colon cancer status post colostomy 1998, paroxysmal atrial fibrillation, hypertension, and recent subarachnoid hemorrhage presenting with nearly 1 week history of intermittent abdominal fullness. The patient was recently admitted to the hospital from 08/20/17 through 08/23/17 secondary to a subarachnoid hemorrhage resulting from a fall at home. As result, the patient was taken off of his warfarin after INR reversal. The patient also was treated for UTI with ciprofloxacin which she finished on 08/26/2017. He presented to the emergency department 08/24/2017 with nausea, vomiting, and abdominal fullness. Abdominal x-ray Suggests inability to rule out underlying bowel obstruction but since Patient's symptoms had resolved in the ED aith normal abdominal exam and Patient tolerating diet without any problem he was discharged back to SNF. He was then seen by physician at the facility next day and again at that time he did not have any complaint and was tolerating diet without a problem.Since then, the patient has had intermittent abdominal fullness and nausea. On the morning of 08/28/2017, the patient had worsening abdominal pain associated with nausea and vomiting. He also reported low colostomy output times 2 days. CT of the abdomen and pelvis in the emergency department showed small bowel dilatation up to 5 cm with air-fluid levels with a transition zone in the lower abdomen and pelvis. In the evening of 08/29/2017, the patient had an episode of emesis after which he developed respiratory distress. He subsequently required intubation.He subsequently became hypotensive requiring aggressive fluid resuscitation.The patient was subsequently placed on Neo-Synephrine which  required a continued upward titration to maintain SBP greater than 90. His antibiotic coverage was broadened to include vancomycin and Zosyn. Pulmonary medicine was consulted to assist with vent management.The patient remained critically ill through the next 3 days after intubation. There was no significant improvement in his clinical condition. Palliative medicine was consulted to assist. After discussion with the patient's sister (POA), it was initially decided upon to transition the patient's focus of care to focus on full comfort. Subsequently, the patient was extubated under compassionate means.Palliative medicine continued to follow patient for symptom management.  They helped transition patient to residential hospice.  Because there were no beds available, the patient was admitted under general inpatient hospice.     Assessment/Plan: ID Sepsis -Secondary to aspiration pneumonia, but cannot rule out bacteremia -concerned about ischemic bowel -Check procalcitonin--64.58 -Lactic acid peaked 3.8 -Urinalysis negative for pyuria -1/20-Personally reviewed chest x-ray--vagueLLL opacity -Continue vancomycin and Zosyn pending culture data -remains hypotensive with phenylephrine at20-35mcg/kg/min -blood cultures neg -d/c vancomycin, continue zosyn -no significant improvement in his clinical condition. Palliative medicine was consulted to assist. After discussion with the patient's sister (POA), it was initially decided upon to transition the patient's focus of care to focus on full comfort. -the patient was extubated under compassionate means.Palliative medicine continued to follow patient for symptom management.  They helped transition patient to residential hospice.  Because there were no beds available, the patient was admitted under general inpatient hospice.   Aspiration pneumonia -Zosyn as discussed -remains intubated with FiO2 40%  PULM Acute respiratory failure  with hypoxia -Secondary to aspiration pneumonia -Intubated and mechanically ventilated -FiO2 at40%, AC -Appreciate Dr. Luan Pulling consult -no attempts at weaning presently -Personally reviewed chest x-ray--vagueLLL opacity  GI Small bowel obstruction -Continue IV fluids -Remain  n.p.o. -General surgery consult--not a surgical candiate -+stool in ostomy -Continue bowel rest -concerned about ischemic bowel given increased abd pain, but remains very poor surgical candidate  Blood in NG -increase PPI to bid -not a candidate for endoscopy  Cholelithiasis -abd US--GB neck stone -discussed with Dr. Blanchie Serve a surgical candidate, no sign of cholecystitis or cholangitis -continuezosyn  CARD Atrial fibrillation with RVR -d/c diltiazem due to hypotension -continue amiodarone -HR 120-130 -Warfarin discontinued recently secondary to subarachnoid hemorrhage  RENAL AKI/Hydronephrosis -Likely secondary to volume depletion/ATN/Sepsis -Continue IV fluidsswitch to 1/2NS -Abdominal ultrasound--bilateral moderate hydronephrosis; cholelithiasis with GB neck stone without GB wall thickening or ductal dilatation -hydronephrosis likely due to urine retention -appreciate urology consult -discussed with urology, Dr. Jillene Bucks candidate for stents or nephrostomy tubes--risks outweigh benefit -renal function continues to worsen   FEN Metabolic Acidosis -due to worsening renal failure -concerned about bowel ischemia -unstable presently to perform CT abd  Hypernatremia -start D51/2NS  Hyperkalemia -treated -repeat BMP  Essential hypertension -now hypotensive  NEURO Recent subarachnoid hemorrhage -Anticoagulation discontinued indefinitely  GOC -DNR -overall poor prognosis -Palliative medicine was consulted to assist. After discussion with the patient's sister (POA), it was initially decided upon to transition the patient's focus of care to focus on  full comfort. -no significant improvement in his clinical condition. Palliative medicine was consulted to assist. After discussion with the patient's sister (POA), it was initially decided upon to transition the patient's focus of care to focus on full comfort. -the patient was extubated under compassionate means.Palliative medicine continued to follow patient for symptom management.  They helped transition patient to residential hospice.  Because there were no beds available, the patient was admitted under general inpatient hospice.           Past Medical History:  Diagnosis Date  . At risk for falls   . Atrial fibrillation ([place])   . Chronic anticoagulation 2002  . Colon carcinoma ([place]) 1998   s/p partial colectomy and permanent colostomyin 1998  . Colostomy in place [facility])   . Glaucoma   . Glaucoma   . Hypertension   . Nontraumatic subarachnoid hemorrhage ([place])   . Overweight(278.02)   . Paroxysmal atrial fibrillation ([place]) 2002   Single episode by history during a noncardiac illness  . UTI (urinary tract infection)    Past Surgical History:  Procedure Laterality Date  . ABDOMINOPERINEAL PROCTOCOLECTOMY  1998   colon carcinoma; permanent colostomy  . CATARACT EXTRACTION     Bilateral  . COLONOSCOPY  11/2007   Dr. Juanda Crumble [facility], via ostomy. unremarkable.   Social History:  reports that  has never smoked. he has never used smokeless tobacco. He reports that he does not drink alcohol or use drugs.   Family History  Problem Relation Age of Onset  . Cancer Mother        gallbladder  . Uterine cancer Maternal Aunt   . Pancreatic cancer Brother   . Liver cancer Sister   . Other Brother        amylodosis  . Kidney cancer Sister      No Known Allergies   Prior to Admission medications   Medication Sig Start Date End Date Taking? Authorizing Provider  diltiazem (CARDIZEM CD) 240 MG 24 hr capsule Take 1 capsule (240 mg total) by  mouth daily. 08/24/17  Yes Isaac Bliss, Rayford Halsted, MD  latanoprost (XALATAN) 0.005 % ophthalmic solution Place 1 drop into both eyes at bedtime.    Yes [provider]  pantoprazole (PROTONIX) 40 MG tablet Take 40 mg by mouth daily.   Yes [provider]  timolol (BETIMOL) 0.25 % ophthalmic solution Place 1 drop into the left eye 2 (two) times daily.    Yes [provider]  ciprofloxacin (CIPRO) 500 MG tablet Take 500 mg by mouth 2 (two) times daily.    [provider]  LORazepam (ATIVAN) 2 MG/ML injection Inject 0.5-1 mLs (1-2 mg total) into the vein every 2 (two) hours as needed for anxiety or seizure. 09/05/2017   Orson Eva, MD  Morphine Sulfate (MORPHINE CONCENTRATE) 10 MG/0.5ML SOLN concentrated solution Take 0.5 mLs (10 mg total) by mouth every 4 (four) hours. 2017/09/28   Orson Eva, MD  Morphine Sulfate (MORPHINE CONCENTRATE) 10 MG/0.5ML SOLN concentrated solution Take 0.25 mLs (5 mg total) by mouth every hour as needed for moderate pain (breathlessness). 09/09/2017   Orson Eva, MD    Review of Systems:  Unobtainable due to mental status Physical Exam: Vitals:   09/11/2017 0205 08/24/2017 1200 08/17/2017 1300 08/27/2017 1900  BP: 114/79     Pulse: (!) 150 (!) 146 (!) 45 (!) 150  Resp: (!) 22 (!) 27 (!) 26 20  Temp:      TempSrc:      SpO2: 98% 98% 100% 96%  Weight:      Height:       General:  Resting comfortably Head/Eye: No conjunctival hemorrhage, no icterus, [place]/AT, No nystagmus ENT:  No icterus Neck:  No masses, no lymphadenpathy, no bruits CV:  RRR, no rub, no gallop, no S3 Lung:  Bibasilar rales Abdomen: soft/NT, +BS, nondistended Ext: No cyanosis, No rashes, No petechiae, No lymphangitis,    Labs on Admission:  Basic Metabolic Panel: Recent Labs  Lab 08/28/17 1014 08/29/17 0444 08/30/17 0437 08/31/17 0121 08/31/17 1045 09/01/17 0336  NA  --  144 148* 151* 147* 145  K  --  4.5 4.5 6.3* 3.7 4.2  CL  --  111 115* 124* 95* 117*  CO2   --  24 20* 16* 34* 18*  GLUCOSE  --  98 85 81 220* 152*  BUN  --  25* 37* 41* 38* 53*  CREATININE  --  1.60* 2.23* 2.68* 2.16* 3.18*  CALCIUM  --  8.8* 8.5* 8.0* 6.0* 8.2*  MG 2.4  --   --  2.1 1.5* 2.2  PHOS  --   --   --  7.0*  --   --    Liver Function Tests: Recent Labs  Lab 08/28/17 1009 08/31/17 0121 09/01/17 0336  AST 57* 44* 61*  ALT 52 30 40  ALKPHOS 116 73 76  BILITOT 1.2 2.9* 2.2*  PROT 8.3* 6.1* 5.4*  ALBUMIN 3.2* 2.5* 1.9*   Recent Labs  Lab 08/28/17 1009  LIPASE 22   No results for input(s): AMMONIA in the last 168 hours. CBC: Recent Labs  Lab 08/28/17 1009 08/29/17 0444 08/30/17 0437 08/31/17 0121 09/01/17 0336  WBC 19.1* 19.3* 30.9* 34.3* 28.3*  NEUTROABS 15.4*  --   --  32.3*  --   HGB 14.8 14.5 15.5 12.9* 11.8*  HCT 44.9 45.8 48.5 42.9 37.3*  MCV 96.8 99.6 102.3* 105.7* 100.0  PLT 270 266 193 165 124*   Coagulation Profile: No results for input(s): INR, PROTIME in the last 168 hours. Cardiac Enzymes: No results for input(s): CKTOTAL, CKMB, CKMBINDEX, TROPONINI in the last 168 hours. BNP: Invalid input(s): POCBNP CBG: Recent Labs  Lab 08/31/17 1940 08/31/17  2354 09/01/17 0337 09/01/17 0740 09/01/17 1130  GLUCAP 119* 144* 140* 140* 150*   Urine analysis:    Component Value Date/Time   COLORURINE YELLOW 08/29/2017 0726   APPEARANCEUR CLEAR 08/29/2017 0726   LABSPEC 1.017 08/29/2017 0726   PHURINE 5.0 08/29/2017 0726   GLUCOSEU NEGATIVE 08/29/2017 0726   HGBUR MODERATE (A) 08/29/2017 0726   BILIRUBINUR NEGATIVE 08/29/2017 0726   KETONESUR NEGATIVE 08/29/2017 0726   PROTEINUR 30 (A) 08/29/2017 0726   NITRITE NEGATIVE 08/29/2017 0726   LEUKOCYTESUR NEGATIVE 08/29/2017 0726   Sepsis Labs: @LABRCNTIP (procalcitonin:4,lacticidven:4) ) Recent Results (from the past 240 hour(s))  MRSA PCR Screening     Status: Abnormal   Collection Time: 08/28/17  4:55 PM  Result Value Ref Range Status   MRSA by PCR POSITIVE (A) NEGATIVE Final     Comment: RESULT CALLED TO, READ BACK BY AND VERIFIED WITH: HOWERTON M. @ 1830 ON [id number]        The GeneXpert MRSA Assay (FDA approved for NASAL specimens only), is one component of a comprehensive MRSA colonization surveillance program. It is not intended to diagnose MRSA infection nor to guide or monitor treatment for MRSA infections.   Culture, Urine     Status: Abnormal   Collection Time: 08/29/17  7:26 AM  Result Value Ref Range Status   Specimen Description URINE, CLEAN CATCH  Final   Special Requests NONE  Final   Culture (A)  Final    <10,000 COLONIES/mL INSIGNIFICANT GROWTH Performed at [facility], [address], [place], [place] [address]    Report Status 08/30/2017 FINAL  Final  Culture, blood (Routine X 2) w Reflex to ID Panel     Status: None (Preliminary result)   Collection Time: 08/30/17 10:44 AM  Result Value Ref Range Status   Specimen Description LEFT ANTECUBITAL  Final   Special Requests   Final    BOTTLES DRAWN AEROBIC AND ANAEROBIC Blood Culture adequate volume   Culture  Setup Time PENDING  Incomplete   Culture NO GROWTH 3 DAYS  Final   Report Status PENDING  Incomplete  Culture, blood (Routine X 2) w Reflex to ID Panel     Status: None (Preliminary result)   Collection Time: 08/30/17 10:45 AM  Result Value Ref Range Status   Specimen Description BLOOD LEFT HAND  Final   Special Requests   Final    BOTTLES DRAWN AEROBIC AND ANAEROBIC Blood Culture adequate volume   Culture NO GROWTH 3 DAYS  Final   Report Status PENDING  Incomplete     Radiological Exams on Admission: Dg Chest Port 1 View  Result Date: 09/01/2017 CLINICAL DATA:  84 year old male with history of respiratory failure. History of colon cancer. EXAM: PORTABLE CHEST 1 VIEW COMPARISON:  Chest x-ray 08/31/2017. FINDINGS: An endotracheal tube is in place with tip 4.8 cm above the carina. There is a left upper extremity PICC with tip terminating in the proximal to mid superior vena  cava. Lung volumes are low. Mild elevation of the right hemidiaphragm. Bibasilar opacities are favored to reflect areas of mild subsegmental atelectasis. There is cephalization of the pulmonary vasculature and slight indistinctness of the interstitial markings suggestive of mild pulmonary edema. Small bilateral pleural effusions. Mild cardiomegaly. The patient is rotated to the right on today's exam, resulting in distortion of the mediastinal contours and reduced diagnostic sensitivity and specificity for mediastinal pathology. IMPRESSION: 1. Support apparatus, as above. 2. The appearance of the chest suggests mild congestive heart failure,  as above. Electronically Signed   By: Vinnie Langton M.D.   On: 09/01/2017 07:01       Time spent:40 minutes Code Status:  FULL COMFORT Family Communication:  Sister updated at bedside Disposition Plan: expect in hospital death Consults called: palliative medicine DVT Prophylaxis: full comfort  Orson Eva, DO  [facility] Pager [phone]  If 7PM-7AM, please contact night-coverage [email or web address] Password [facility] 09/01/2017, 8:35 PM

## 2017-09-02 NOTE — Progress Notes (Signed)
Daily Progress Note   Patient Name: Joel Bishop       Date: 08/31/2017 DOB: 1928-01-30  Age: 84 y.o. MRN#: [id number] Attending Physician: Orson Eva, MD Primary Care Physician: Rosita Fire, MD Admit Date: 08/28/2017  Reason for Consultation/Follow-up: Disposition and Inpatient hospice referral  Subjective: Mr. Kock is resting quietly in bed.  He will briefly open his eyes, but not try to communicate with me in any meaningful way.  He seems relatively comfortable, but seems to be actively dying.  There is no family at bedside at this time.  Call to sister, healthcare power of attorney, Claretta Fraise.  We update on Mr. Ryerson's condition, his comfort level.  Izora Gala and I talked about disposition to hospice home for specialized medical care at end of life.  Izora Gala states that her preference would be for Mr. Nita Sickle to stay in the hospital, GIP status, if possible as this is closer for her.  Detail conference related to patient care with the nursing staff. Detail conference related to patient care with social worker. Detailed conference related to patient care with Dr. Carles Collet.  Length of Stay: 5  Current Medications: Scheduled Meds:  . chlorhexidine gluconate (MEDLINE KIT)  15 mL Mouth Rinse BID  . Chlorhexidine Gluconate Cloth  6 each Topical Daily  . mouth rinse  15 mL Mouth Rinse QID  . morphine CONCENTRATE  5 mg Oral Q4H    Continuous Infusions:   PRN Meds: artificial tears, midazolam, midazolam, morphine CONCENTRATE, ondansetron **OR** ondansetron (ZOFRAN) IV, sodium chloride flush  Physical Exam  Constitutional: No distress.  HENT:  Head: Atraumatic.  Temporal wasting, orbital wasting  Cardiovascular:  Bradycardia  Pulmonary/Chest: Effort normal. No respiratory  distress.  Abdominal: Soft. He exhibits no distension.  Ostomy  Musculoskeletal: He exhibits edema.  Neurological:  Lethargic, dying  Skin: Skin is warm and dry.  Nursing note and vitals reviewed.           Vital Signs: BP 114/79   Pulse (!) 45   Temp 98.1 F (36.7 C) (Axillary)   Resp (!) 26   Ht '5\' 6"'$  (1.676 m)   Wt 103.8 kg (228 lb 13.4 oz)   SpO2 100%   BMI 36.94 kg/m  SpO2: SpO2: 100 % O2 Device: O2 Device: Nasal Cannula O2 Flow Rate: O2 Flow Rate (L/min):  4 L/min  Intake/output summary:   Intake/Output Summary (Last 24 hours) at 08/28/2017 1328 Last data filed at 09/01/2017 2043 Gross per 24 hour  Intake -  Output 930 ml  Net -930 ml   LBM: Last BM Date: 08/30/17 Baseline Weight: Weight: 100.7 kg (222 lb) Most recent weight: Weight: 103.8 kg (228 lb 13.4 oz)       Palliative Assessment/Data:    Flowsheet Rows     Most Recent Value  Intake Tab  Referral Department  Hospitalist  Unit at Time of Referral  ICU  Palliative Care Primary Diagnosis  Other (Comment) [SBO]  Date Notified  08/31/17  Palliative Care Type  New Palliative care  Reason for referral  Advance Care Planning, Clarify Goals of Care, Psychosocial or Spiritual support  Date of Admission  08/28/17  Date first seen by Palliative Care  09/01/17  # of days Palliative referral response time  1 Day(s)  # of days IP prior to Palliative referral  3  Clinical Assessment  Palliative Performance Scale Score  10%  Pain Max last 24 hours  Not able to report  Pain Min Last 24 hours  Not able to report  Dyspnea Max Last 24 Hours  Not able to report  Dyspnea Min Last 24 hours  Not able to report  Psychosocial & Spiritual Assessment  Palliative Care Outcomes  Patient/Family meeting held?  Yes  Who was at the meeting?  sister via phone   Palliative Care Outcomes  Clarified goals of care  Patient/Family wishes: Interventions discontinued/not started   Mechanical Ventilation      Patient Active  Problem List   Diagnosis Date Noted  . Palliative care encounter   . Goals of care, counseling/discussion   . Hyperkalemia 08/31/2017  . Hydronephrosis 08/31/2017  . Sepsis due to undetermined organism ([place]) 08/30/2017  . Aspiration pneumonia of both lower lobes due to gastric secretions ([place]) 08/30/2017  . Acute respiratory failure with hypoxia ([place]) 08/30/2017  . Sepsis ([place]) 08/30/2017  . Endotracheal tube present   . AKI (acute kidney injury) ([place])   . SBO (small bowel obstruction) ([place]) 08/28/2017  . Hypokalemia 08/28/2017  . AF (paroxysmal atrial fibrillation) ([place]) 08/28/2017  . Leucocytosis 08/28/2017  . Essential hypertension 08/28/2017  . SAH (subarachnoid hemorrhage) ([place]) 08/20/2017  . Encounter for therapeutic drug monitoring 09/27/2013  . Colon carcinoma ([place])   . Atrial fibrillation ([place]) 05/29/2011  . Chronic anticoagulation 05/29/2011    Palliative Care Assessment & Plan   Patient Profile: 84 y.o. male  with past medical history of ostomy, fall with some arachnoid hemorrhage, aspiration, chronic kidney disease, admitted on 08/28/2017 with sepsis, hypotension.   Assessment: sepsis, hypotension: Compassionate extubation 1/21.  Full comfort care.  Attempted placement at local hospice home today.  Recommendations/Plan:  Full comfort care.  Attempted placement at local hospice home today.  Goals of Care and Additional Recommendations:  Limitations on Scope of Treatment: Full Comfort Care  Code Status:    Code Status Orders  (From admission, onward)        Start     Ordered   08/30/17 1205  Do not attempt resuscitation (DNR)  Continuous    Question Answer Comment  In the event of cardiac or respiratory ARREST Do not call a "code blue"   In the event of cardiac or respiratory ARREST Do not perform Intubation, CPR, defibrillation or ACLS   In the event of cardiac or respiratory ARREST Use medication by any route, position, wound care,  and other measures to  relive pain and suffering. May use oxygen, suction and manual treatment of airway obstruction as needed for comfort.      08/30/17 1204    Code Status History    Date Active Date Inactive Code Status Order ID Comments User Context   08/28/2017 15:49 08/30/2017 12:04 Full Code [id number]  Louellen Molder, MD Inpatient   08/21/2017 00:45 08/23/2017 16:13 Full Code [id number]  Oswald Hillock, MD Inpatient    Advance Directive Documentation     Most Recent Value  Type of Advance Directive  Healthcare Power of Attorney  Pre-existing out of facility DNR order (yellow form or pink MOST form)  No data  "MOST" Form in Place?  No data       Prognosis:   < 2 weeks  Discharge Planning:  [facility] home for comfort and dignity at end of life.  Care plan was discussed with nursing staff, case management, social worker, and Dr. Carles Collet.  Thank you for allowing the Palliative Medicine Team to assist in the care of this patient.   Time In:  0845 Time Out:  0920 Total Time  35 minutes Prolonged Time Billed  no       Greater than 50%  of this time was spent counseling and coordinating care related to the above assessment and plan.  Drue Novel, NP  Please contact Palliative Medicine Team phone at [phone] for questions and concerns.

## 2017-09-02 NOTE — Progress Notes (Signed)
He has been transitioned to comfort care so I will sign off  Thanks for allowing me to participate.

## 2017-09-02 NOTE — Discharge Summary (Signed)
Physician Discharge Summary  Amardeep Beckers FBP:[id number] DOB: 03/09/1928 DOA: 08/28/2017  PCP: Rosita Fire, MD  Admit date: 08/28/2017 Discharge date: 08/19/2017  Admitted From: SNF Disposition:  Residential hospice    Discharge Condition: Stable CODE STATUS: FULL COMFORT Diet recommendation: pleasure feeding   Brief/Interim Summary: 84 year old male with a history of colon cancer status post colostomy 1998, paroxysmal atrial fibrillation, hypertension, and recent subarachnoid hemorrhage presenting with nearly 1 week history of intermittent abdominal fullness. The patient was recently admitted to the hospital from 08/20/17 through 08/23/17 secondary to a subarachnoid hemorrhage resulting from a fall at home. As result, the patient was taken off of his warfarin after INR reversal. The patient also was treated for UTI with ciprofloxacin which she finished on 08/26/2017. He presented to the emergency department 08/24/2017 with nausea, vomiting, and abdominal fullness. Abdominal x-ray Suggests inability to rule out underlying bowel obstruction but since Patient's symptoms had resolved in the ED aith normal abdominal exam and Patient tolerating diet without any problem he was discharged back to SNF. He was then seen by physician at the facility next day and again at that time he did not have any complaint and was tolerating diet without a problem.Since then, the patient has had intermittent abdominal fullness and nausea. On the morning of 08/28/2017, the patient had worsening abdominal pain associated with nausea and vomiting. He also reported low colostomy output times 2 days. CT of the abdomen and pelvis in the emergency department showed small bowel dilatation up to 5 cm with air-fluid levels with a transition zone in the lower abdomen and pelvis. In the evening of 08/29/2017, the patient had an episode of emesis after which he developed respiratory distress. He subsequently required  intubation.He subsequently became hypotensive requiring aggressive fluid resuscitation.The patient was subsequently placed on Neo-Synephrine which required a continued upward titration to maintain SBP greater than 90. His antibiotic coverage was broadened to include vancomycin and Zosyn. Pulmonary medicine was consulted to assist with vent management.  The patient remained critically ill through the next 3 days after intubation.  There was no significant improvement in his clinical condition.  Palliative medicine was consulted to assist.  After discussion with the patient's sister (POA), it was initially decided upon to transition the patient's focus of care to focus on full comfort.  Subsequently, the patient was extubated under compassionate means. Palliative medicine continued to follow patient for symptom management.  They helped transition patient to residential hospice.  Because there were no beds available, the patient was admitted under general inpatient hospice.     Discharge Diagnoses:  ID Sepsis -Secondary to aspiration pneumonia, but cannot rule out bacteremia -concerned about ischemic bowel -Check procalcitonin--64.58 -Lactic acid peaked 3.8 -Urinalysis negative for pyuria -1/20-Personally reviewed chest x-ray--vagueLLL opacity -Continue vancomycin and Zosyn pending culture data -remains hypotensive with phenylephrine at 20-30 mcg/kg/min -blood cultures neg -d/c vancomycin, continue zosyn -no significant improvement in his clinical condition.  Palliative medicine was consulted to assist.  After discussion with the patient's sister (POA), it was initially decided upon to transition the patient's focus of care to focus on full comfort.  -the patient was extubated under compassionate means. Palliative medicine continued to follow patient for symptom management.  They helped transition patient to residential hospice.  Because there were no beds available, the patient was admitted  under general inpatient hospice.   Aspiration pneumonia -Zosyn as discussed -remains intubated with FiO2 40%  PULM Acute respiratory failure with hypoxia -Secondary to aspiration pneumonia -Intubated and mechanically  ventilated -FiO2 at 40%, AC -Appreciate Dr. Luan Pulling consult -no attempts at weaning presently -Personally reviewed chest x-ray--vagueLLL opacity  GI Small bowel obstruction -Continue IV fluids -Remain n.p.o. -General surgery consult--not a surgical candiate -+stool in ostomy -Continue bowel rest -concerned about ischemic bowel given increased abd pain, but remains very poor surgical candidate  Blood in NG -increase PPI to bid -not a candidate for endoscopy  Cholelithiasis -abd US--GB neck stone -discussed with Dr. Blanchie Serve a surgical candidate, no sign of cholecystitis or cholangitis -continuezosyn  CARD Atrial fibrillation with RVR -d/c diltiazem due to hypotension -continue amiodarone -HR 120-130 -Warfarin discontinued recently secondary to subarachnoid hemorrhage  RENAL AKI/Hydronephrosis -Likely secondary to volume depletion/ATN/Sepsis -Continue IV fluidsswitch to 1/2NS -Abdominal ultrasound--bilateral moderate hydronephrosis; cholelithiasis with GB neck stone without GB wall thickening or ductal dilatation -hydronephrosis likely due to urine retention -appreciate urology consult -discussed with urology, Dr. Jillene Bucks candidate for stents or nephrostomy tubes--risks outweigh benefit -renal function continues to worsen   FEN Metabolic Acidosis -due to worsening renal failure -concerned about bowel ischemia -unstable presently to perform CT abd  Hypernatremia -start D51/2NS  Hyperkalemia -treated -repeat BMP  Essential hypertension -now hypotensive  NEURO Recent subarachnoid hemorrhage -Anticoagulation discontinued indefinitely  GOC -DNR -overall poor prognosis - Palliative medicine was  consulted to assist.  After discussion with the patient's sister (POA), it was initially decided upon to transition the patient's focus of care to focus on full comfort.  -no significant improvement in his clinical condition.  Palliative medicine was consulted to assist.  After discussion with the patient's sister (POA), it was initially decided upon to transition the patient's focus of care to focus on full comfort.  -the patient was extubated under compassionate means. Palliative medicine continued to follow patient for symptom management.  They helped transition patient to residential hospice.  Because there were no beds available, the patient was admitted under general inpatient hospice.   Discharge Instructions   Allergies as of 08/31/2017   No Known Allergies     Medication List    STOP taking these medications   ciprofloxacin 500 MG tablet Commonly known as:  CIPRO   diltiazem 240 MG 24 hr capsule Commonly known as:  CARDIZEM CD   latanoprost 0.005 % ophthalmic solution Commonly known as:  XALATAN   pantoprazole 40 MG tablet Commonly known as:  PROTONIX   timolol 0.25 % ophthalmic solution Commonly known as:  BETIMOL     TAKE these medications   LORazepam 2 MG/ML injection Commonly known as:  ATIVAN Inject 0.5-1 mLs (1-2 mg total) into the vein every 2 (two) hours as needed for anxiety or seizure.   morphine CONCENTRATE 10 MG/0.5ML Soln concentrated solution Take 0.25 mLs (5 mg total) by mouth every hour as needed for moderate pain (breathlessness).   morphine CONCENTRATE 10 MG/0.5ML Soln concentrated solution Take 0.5 mLs (10 mg total) by mouth every 4 (four) hours. Start taking on:  2017-09-08       No Known Allergies  Consultations:  Pulmonary medicine  Palliative medicine   Procedures/Studies: Dg Chest 1 View  Result Date: 08/20/2017 CLINICAL DATA:  Generalized weakness EXAM: CHEST 1 VIEW COMPARISON:  None. FINDINGS: Cardiomegaly and vascular pedicle  widening. Congested appearance of vessels without Kerley lines or effusion. No pneumothorax or air bronchogram. Artifact from EKG leads. IMPRESSION: Cardiomegaly and vascular congestion accentuated by low volumes. Electronically Signed   By: Monte Fantasia M.D.   On: 08/20/2017 20:04   Ct Head Wo Contrast  Result  Date: 08/21/2017 CLINICAL DATA:  [place] yesterday, subarachnoid hemorrhage, followup EXAM: CT HEAD WITHOUT CONTRAST TECHNIQUE: Contiguous axial images were obtained from the base of the skull through the vertex without intravenous contrast. Sagittal and coronal MPR images reconstructed from axial data set. COMPARISON:  08/20/2017 FINDINGS: Brain: Generalized atrophy. Prominent ventricular system unchanged. No midline shift or mass effect. Small vessel chronic ischemic changes of deep cerebral white matter. Again identified small amount of high attenuation acute subarachnoid hemorrhage at the interhemispheric fissure, unchanged. No new areas of intracranial hemorrhage, mass lesion, or acute infarction. No new extra-axial collections. Vascular: Atherosclerotic calcifications of internal carotid arteries at skull base Skull: Intact though demineralized Sinuses/Orbits: Clear Other: N/A IMPRESSION: Stable appearance of a small amount of acute subarachnoid hemorrhage at the interhemispheric fissure anteriorly. Atrophy with small vessel chronic ischemic changes of deep cerebral white matter. No new intracranial abnormalities. Electronically Signed   By: Lavonia Dana M.D.   On: 08/21/2017 16:20   Ct Head Wo Contrast  Result Date: 08/20/2017 CLINICAL DATA:  84 year old male with history of confusion today. Swelling in both legs. EXAM: CT HEAD WITHOUT CONTRAST TECHNIQUE: Contiguous axial images were obtained from the base of the skull through the vertex without intravenous contrast. COMPARISON:  No priors. FINDINGS: Brain: Small amount of high attenuation in the anterior aspect of the interhemispheric fissure  best appreciated on axial images 20 and 21 and coronal images 25 through 29. Patchy and confluent areas of decreased attenuation are noted throughout the deep and periventricular white matter of the cerebral hemispheres bilaterally, compatible with chronic microvascular ischemic disease. Moderate to severe cerebral atrophy with associated ex vacuo dilatation of the ventricular system. No evidence of acute infarction, mass or mass effect. Vascular: No hyperdense vessel or unexpected calcification. Skull: Normal. Negative for fracture or focal lesion. Sinuses/Orbits: No acute finding. Other: None. IMPRESSION: 1. Small amount of subarachnoid hemorrhage noted in the anterior interhemispheric fissure. 2. Moderate to severe cerebral atrophy with probable ex vacuo dilatation of the ventricular system. The possibility of normal pressure hydrocephalus (NPH) is not entirely excluded. 3. Extensive chronic microvascular ischemic changes throughout the cerebral white matter, as above. Critical Value/emergent results were called by telephone at the time of interpretation on 08/20/2017 at 8:23 pm to Dr. Nat Christen, who verbally acknowledged these results. Electronically Signed   By: Vinnie Langton M.D.   On: 08/20/2017 20:23   US Abdomen Complete  Result Date: 08/29/2017 CLINICAL DATA:  Elevated liver function tests. Acute renal insufficiency. EXAM: ABDOMEN ULTRASOUND COMPLETE COMPARISON:  Abdominal CT 08/28/2017 FINDINGS: Gallbladder: The gallbladder wall is normally distended. There is a calculus measuring 1.2 cm in the neck of the gallbladder without appreciable gallbladder wall thickening. The gallbladder wall measures 1.8 mm. No pericholecystic fluid. Common bile duct: Diameter: 6.2 mm Liver: No focal lesion identified. Coarse parenchymal echogenicity. Portal vein is patent on color Doppler imaging with normal direction of blood flow towards the liver. IVC: Limited visualization without focal abnormality. Pancreas: Not  seen. Spleen: Size and appearance within normal limits. Right Kidney: Length: 10.1 cm. Echogenicity within normal limits. Moderate hydronephrosis visualized. Left Kidney: Length: 11.8 cm. Echogenicity within normal limits. Moderate hydronephrosis visualized. Abdominal aorta: Mid to distal aorta is not seen. No aneurysmal dilation of the proximal aorta. Other findings: None. IMPRESSION: Limited visualization due to ongoing small bowel obstruction. Cholelithiasis without evidence of acute cholecystitis. Bilateral moderate hydronephrosis. Electronically Signed   By: Fidela Salisbury M.D.   On: 08/29/2017 10:33   Ct Abdomen Pelvis W  Contrast  Result Date: 08/28/2017 CLINICAL DATA:  Decreased urine output. Small bowel obstruction. History of prostatectomy and colostomy in 1998 EXAM: CT ABDOMEN AND PELVIS WITH CONTRAST TECHNIQUE: Multidetector CT imaging of the abdomen and pelvis was performed using the standard protocol following bolus administration of intravenous contrast. CONTRAST:  133mL ISOVUE-300 IOPAMIDOL (ISOVUE-300) INJECTION 61% COMPARISON:  None. FINDINGS: Lower chest: Small left pleural effusion. Left basilar atelectasis. Mild right basilar atelectasis. Elevation of the right diaphragm. Hepatobiliary: No focal hepatic mass. Dystrophic calcification in the right hepatic lobe adjacent to the middle portal vein. Normal gallbladder. No intrahepatic or extrahepatic biliary ductal dilatation. Pancreas: Unremarkable. No pancreatic ductal dilatation or surrounding inflammatory changes. Spleen: Normal in size without focal abnormality. Adrenals/Urinary Tract: Adrenal glands are unremarkable. Kidneys are normal, without renal calculi, focal lesion, or hydronephrosis. Multiple bladder calculi are noted. Stomach/Bowel: Severe small bowel dilatation measuring up to 5 cm in diameter with multiple air-fluid levels with a relative transition zone where there appears to be tethering in the lower abdomen/pelvis likely  secondary to adhesions. Distal small bowel is decompressed. Colon is decompressed. No pneumatosis, pneumoperitoneum or portal venous gas. Left lower quadrant colostomy. Vascular/Lymphatic: Abdominal aortic atherosclerosis. Normal caliber abdominal aorta. No lymphadenopathy. Reproductive: Prior prostatectomy. Other: Left inguinal fat containing hernia. 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 Presacral soft tissue prominence likely related to posttreatment changes. Musculoskeletal: No acute osseous abnormality. No aggressive osseous lesion. Ankylosis of bilateral sacroiliac joints. Degenerative disc disease with disc height loss at L3-4, L4-5 and L5-S1 with bilateral facet arthropathy. IMPRESSION: 1. Severe small bowel obstruction with a relative transition zone in the lower abdomen/pelvis likely secondary to adhesions. Small bowel measures up to 5 cm in diameter. 2.  Aortic Atherosclerosis (ICD10-I70.0). 3. 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 4.  Left lower quadrant colostomy. 5. Multiple bladder calculi. 6. Small left pleural effusion. Electronically Signed   By: Kathreen Devoid   On: 08/28/2017 12:12   Dg Chest 1v Repeat Same Day  Result Date: 08/30/2017 CLINICAL DATA:  PICC line placement. EXAM: CHEST - 1 VIEW SAME DAY COMPARISON:  08/30/2017 FINDINGS: Endotracheal tube has been readjusted and now terminates 8.8 cm above the carina. Enteric catheter likely within the stomach. Left-sided PICC line terminates in the expected location of superior vena cava. Enlarged cardiac silhouette.  Mediastinal contours appear intact. There is no evidence of focal airspace consolidation, pleural effusion or pneumothorax. Mild peribronchial thickening. Small left pleural effusion. Osseous structures are without acute abnormality. Soft tissues are grossly normal. IMPRESSION: Left-sided PICC line terminates in the expected location of  superior vena cava. No evidence of pneumothorax. Endotracheal tube terminates 8.8 cm above the carina. Advancement with approximately 4 cm may be considered. Low lung volumes with chronic bronchitic changes. Small left pleural effusion. Electronically Signed   By: Fidela Salisbury M.D.   On: 08/30/2017 15:26   Dg Chest Port 1 View  Result Date: 09/01/2017 CLINICAL DATA:  84 year old male with history of respiratory failure. History of colon cancer. EXAM: PORTABLE CHEST 1 VIEW COMPARISON:  Chest x-ray 08/31/2017. FINDINGS: An endotracheal tube is in place with tip 4.8 cm above the carina. There is a left upper extremity PICC with tip terminating in the proximal to mid superior vena cava. Lung volumes are low. Mild elevation of the right hemidiaphragm. Bibasilar opacities are favored to reflect areas of mild subsegmental atelectasis. There is cephalization of the pulmonary vasculature and slight indistinctness of the interstitial markings suggestive of mild pulmonary  edema. Small bilateral pleural effusions. Mild cardiomegaly. The patient is rotated to the right on today's exam, resulting in distortion of the mediastinal contours and reduced diagnostic sensitivity and specificity for mediastinal pathology. IMPRESSION: 1. Support apparatus, as above. 2. The appearance of the chest suggests mild congestive heart failure, as above. Electronically Signed   By: Vinnie Langton M.D.   On: 09/01/2017 07:01   Dg Chest Port 1 View  Result Date: 08/31/2017 CLINICAL DATA:  Intubated patient. EXAM: PORTABLE CHEST 1 VIEW COMPARISON:  08/30/2017 FINDINGS: Endotracheal tube is appropriately positioned above the carina. Nasogastric tube extends into the abdomen. Persistent densities at the left chest base. Left hemidiaphragm remains obscured. Mild fullness of the central vascular structures and interstitial markings. Left arm PICC tip is near the SVC. Negative for a pneumothorax. IMPRESSION: Persistent densities at the  left lung base. Findings could represent a combination of consolidation and pleural fluid. No significant change. Electronically Signed   By: Markus Daft M.D.   On: 08/31/2017 08:34   Portable Chest X-ray  Result Date: 08/30/2017 CLINICAL DATA:  84 year old male status post intubation. EXAM: PORTABLE CHEST 1 VIEW COMPARISON:  Chest radiograph dated 08/29/2017. FINDINGS: There has been interval placement of an endotracheal tube with tip approximately at the level of the carina. Recommend retraction of the tube by approximately 3-4 cm. Enteric tube extends below the diaphragm with side port in the left upper abdomen and tip beyond the inferior margin of the image. There is shallow inspiration with bibasilar atelectatic changes. A small left pleural effusion may be present. Pneumonia is not excluded. There is no pneumothorax. Stable mild cardiomegaly. No acute osseous pathology. IMPRESSION: 1. Endotracheal tube at the level of the carina. Recommend retraction by approximately 3-4 cm for optimal positioning. 2. Shallow inspiration with bibasilar atelectasis. Possible small left pleural effusion. These results were called by telephone at the time of interpretation on 08/30/2017 at 2:03 am to Dr. Olevia Bowens, who verbally acknowledged these results. Electronically Signed   By: Anner Crete M.D.   On: 08/30/2017 02:17   Dg Chest Port 1 View  Result Date: 08/29/2017 CLINICAL DATA:  NG tube placement EXAM: PORTABLE CHEST 1 VIEW COMPARISON:  08/29/2017 FINDINGS: Cardiomegaly with vascular congestion. Small left effusion and patchy basilar atelectasis or infiltrate on the left. Esophageal tube tip extends below diaphragm but tip is incompletely included. IMPRESSION: 1. Esophageal tube tip is below the diaphragm but incompletely included on the current image. 2. Cardiomegaly with vascular congestion. Patchy atelectasis or infiltrate at the left base Electronically Signed   By: Donavan Foil M.D.   On: 08/29/2017 23:26    Dg Chest Port 1 View  Result Date: 08/29/2017 CLINICAL DATA:  Sudden onset shortness of breath EXAM: PORTABLE CHEST 1 VIEW COMPARISON:  08/24/2017, 08/20/2017 FINDINGS: Cardiomegaly with vascular congestion and mild interstitial opacities suggesting edema. Patchy atelectasis or infiltrate at the left base. No pneumothorax. IMPRESSION: 1. Cardiomegaly with vascular congestion and mild interstitial opacity suggesting minimal edema. 2. Patchy atelectasis versus minimal infiltrate at the left base. Electronically Signed   By: Donavan Foil M.D.   On: 08/29/2017 23:16   Dg Abd Acute W/chest  Result Date: 08/24/2017 CLINICAL DATA:  Nausea, vomiting, abdominal pain EXAM: DG ABDOMEN ACUTE W/ 1V CHEST COMPARISON:  08/20/2017 FINDINGS: Cardiomegaly.  Bibasilar atelectasis.  No effusions. Mildly prominent small bowel loops in the mid abdomen. Gas is noted within decompressed colon. Cannot exclude partial small bowel obstruction. No free air organomegaly. IMPRESSION: Dilated small bowel loops  in the mid abdomen. Cannot exclude partial small bowel obstruction. Cardiomegaly, bibasilar atelectasis. Electronically Signed   By: Rolm Baptise M.D.   On: 08/24/2017 14:53   Dg Abd Portable 2v  Result Date: 08/29/2017 CLINICAL DATA:  Shortness of breath, history of colon cancer EXAM: PORTABLE ABDOMEN - 2 VIEW COMPARISON:  CT abdomen 08/28/2017 FINDINGS: There is persistent severe dilatation of the small bowel. There is no evidence of pneumoperitoneum, portal venous gas or pneumatosis. There are no pathologic calcifications along the expected course of the ureters. There is contrast in the bladder from recent IV contrast injection. There is stable cardiomegaly. There elevation of the right diaphragm. The osseous structures are unremarkable. IMPRESSION: Persistent small-bowel obstruction. Electronically Signed   By: Kathreen Devoid   On: 08/29/2017 09:47         Discharge Exam: Vitals:   08/28/2017 1300 08/14/2017 1900   BP:    Pulse: (!) 45 (!) 150  Resp: (!) 26 20  Temp:    SpO2: 100% 96%   Vitals:   08/16/2017 0205 08/25/2017 1200 08/27/2017 1300 08/29/2017 1900  BP: 114/79     Pulse: (!) 150 (!) 146 (!) 45 (!) 150  Resp: (!) 22 (!) 27 (!) 26 20  Temp:      TempSrc:      SpO2: 98% 98% 100% 96%  Weight:      Height:        General: Pt is resting comfortably, not in acute distress Cardiovascular: RRR, S1/S2 +, no rubs, no gallops Respiratory: CTA bilaterally, no wheezing, no rhonchi Abdominal: Soft, NT, ND, bowel sounds +    The results of significant diagnostics from this hospitalization (including imaging, microbiology, ancillary and laboratory) are listed below for reference.    Significant Diagnostic Studies: Dg Chest 1 View  Result Date: 08/20/2017 CLINICAL DATA:  Generalized weakness EXAM: CHEST 1 VIEW COMPARISON:  None. FINDINGS: Cardiomegaly and vascular pedicle widening. Congested appearance of vessels without Kerley lines or effusion. No pneumothorax or air bronchogram. Artifact from EKG leads. IMPRESSION: Cardiomegaly and vascular congestion accentuated by low volumes. Electronically Signed   By: Monte Fantasia M.D.   On: 08/20/2017 20:04   Ct Head Wo Contrast  Result Date: 08/21/2017 CLINICAL DATA:  [place] yesterday, subarachnoid hemorrhage, followup EXAM: CT HEAD WITHOUT CONTRAST TECHNIQUE: Contiguous axial images were obtained from the base of the skull through the vertex without intravenous contrast. Sagittal and coronal MPR images reconstructed from axial data set. COMPARISON:  08/20/2017 FINDINGS: Brain: Generalized atrophy. Prominent ventricular system unchanged. No midline shift or mass effect. Small vessel chronic ischemic changes of deep cerebral white matter. Again identified small amount of high attenuation acute subarachnoid hemorrhage at the interhemispheric fissure, unchanged. No new areas of intracranial hemorrhage, mass lesion, or acute infarction. No new extra-axial  collections. Vascular: Atherosclerotic calcifications of internal carotid arteries at skull base Skull: Intact though demineralized Sinuses/Orbits: Clear Other: N/A IMPRESSION: Stable appearance of a small amount of acute subarachnoid hemorrhage at the interhemispheric fissure anteriorly. Atrophy with small vessel chronic ischemic changes of deep cerebral white matter. No new intracranial abnormalities. Electronically Signed   By: Lavonia Dana M.D.   On: 08/21/2017 16:20   Ct Head Wo Contrast  Result Date: 08/20/2017 CLINICAL DATA:  84 year old male with history of confusion today. Swelling in both legs. EXAM: CT HEAD WITHOUT CONTRAST TECHNIQUE: Contiguous axial images were obtained from the base of the skull through the vertex without intravenous contrast. COMPARISON:  No priors. FINDINGS: Brain: Small amount  of high attenuation in the anterior aspect of the interhemispheric fissure best appreciated on axial images 20 and 21 and coronal images 25 through 29. Patchy and confluent areas of decreased attenuation are noted throughout the deep and periventricular white matter of the cerebral hemispheres bilaterally, compatible with chronic microvascular ischemic disease. Moderate to severe cerebral atrophy with associated ex vacuo dilatation of the ventricular system. No evidence of acute infarction, mass or mass effect. Vascular: No hyperdense vessel or unexpected calcification. Skull: Normal. Negative for fracture or focal lesion. Sinuses/Orbits: No acute finding. Other: None. IMPRESSION: 1. Small amount of subarachnoid hemorrhage noted in the anterior interhemispheric fissure. 2. Moderate to severe cerebral atrophy with probable ex vacuo dilatation of the ventricular system. The possibility of normal pressure hydrocephalus (NPH) is not entirely excluded. 3. Extensive chronic microvascular ischemic changes throughout the cerebral white matter, as above. Critical Value/emergent results were called by telephone at the  time of interpretation on 08/20/2017 at 8:23 pm to Dr. Nat Christen, who verbally acknowledged these results. Electronically Signed   By: Vinnie Langton M.D.   On: 08/20/2017 20:23   US Abdomen Complete  Result Date: 08/29/2017 CLINICAL DATA:  Elevated liver function tests. Acute renal insufficiency. EXAM: ABDOMEN ULTRASOUND COMPLETE COMPARISON:  Abdominal CT 08/28/2017 FINDINGS: Gallbladder: The gallbladder wall is normally distended. There is a calculus measuring 1.2 cm in the neck of the gallbladder without appreciable gallbladder wall thickening. The gallbladder wall measures 1.8 mm. No pericholecystic fluid. Common bile duct: Diameter: 6.2 mm Liver: No focal lesion identified. Coarse parenchymal echogenicity. Portal vein is patent on color Doppler imaging with normal direction of blood flow towards the liver. IVC: Limited visualization without focal abnormality. Pancreas: Not seen. Spleen: Size and appearance within normal limits. Right Kidney: Length: 10.1 cm. Echogenicity within normal limits. Moderate hydronephrosis visualized. Left Kidney: Length: 11.8 cm. Echogenicity within normal limits. Moderate hydronephrosis visualized. Abdominal aorta: Mid to distal aorta is not seen. No aneurysmal dilation of the proximal aorta. Other findings: None. IMPRESSION: Limited visualization due to ongoing small bowel obstruction. Cholelithiasis without evidence of acute cholecystitis. Bilateral moderate hydronephrosis. Electronically Signed   By: Fidela Salisbury M.D.   On: 08/29/2017 10:33   Ct Abdomen Pelvis W Contrast  Result Date: 08/28/2017 CLINICAL DATA:  Decreased urine output. Small bowel obstruction. History of prostatectomy and colostomy in 1998 EXAM: CT ABDOMEN AND PELVIS WITH CONTRAST TECHNIQUE: Multidetector CT imaging of the abdomen and pelvis was performed using the standard protocol following bolus administration of intravenous contrast. CONTRAST:  178mL ISOVUE-300 IOPAMIDOL (ISOVUE-300) INJECTION  61% COMPARISON:  None. FINDINGS: Lower chest: Small left pleural effusion. Left basilar atelectasis. Mild right basilar atelectasis. Elevation of the right diaphragm. Hepatobiliary: No focal hepatic mass. Dystrophic calcification in the right hepatic lobe adjacent to the middle portal vein. Normal gallbladder. No intrahepatic or extrahepatic biliary ductal dilatation. Pancreas: Unremarkable. No pancreatic ductal dilatation or surrounding inflammatory changes. Spleen: Normal in size without focal abnormality. Adrenals/Urinary Tract: Adrenal glands are unremarkable. Kidneys are normal, without renal calculi, focal lesion, or hydronephrosis. Multiple bladder calculi are noted. Stomach/Bowel: Severe small bowel dilatation measuring up to 5 cm in diameter with multiple air-fluid levels with a relative transition zone where there appears to be tethering in the lower abdomen/pelvis likely secondary to adhesions. Distal small bowel is decompressed. Colon is decompressed. No pneumatosis, pneumoperitoneum or portal venous gas. Left lower quadrant colostomy. Vascular/Lymphatic: Abdominal aortic atherosclerosis. Normal caliber abdominal aorta. No lymphadenopathy. Reproductive: Prior prostatectomy. Other: Left inguinal fat containing hernia. Ventral wide-mouth  abdominal hernia containing transverse colon without obstruction. Presacral soft tissue prominence likely related to posttreatment changes. Musculoskeletal: No acute osseous abnormality. No aggressive osseous lesion. Ankylosis of bilateral sacroiliac joints. Degenerative disc disease with disc height loss at L3-4, L4-5 and L5-S1 with bilateral facet arthropathy. IMPRESSION: 1. Severe small bowel obstruction with a relative transition zone in the lower abdomen/pelvis likely secondary to adhesions. Small bowel measures up to 5 cm in diameter. 2.  Aortic Atherosclerosis (ICD10-I70.0). 3. 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 4.   Left lower quadrant colostomy. 5. Multiple bladder calculi. 6. Small left pleural effusion. Electronically Signed   By: Kathreen Devoid   On: 08/28/2017 12:12   Dg Chest 1v Repeat Same Day  Result Date: 08/30/2017 CLINICAL DATA:  PICC line placement. EXAM: CHEST - 1 VIEW SAME DAY COMPARISON:  08/30/2017 FINDINGS: Endotracheal tube has been readjusted and now terminates 8.8 cm above the carina. Enteric catheter likely within the stomach. Left-sided PICC line terminates in the expected location of superior vena cava. Enlarged cardiac silhouette.  Mediastinal contours appear intact. There is no evidence of focal airspace consolidation, pleural effusion or pneumothorax. Mild peribronchial thickening. Small left pleural effusion. Osseous structures are without acute abnormality. Soft tissues are grossly normal. IMPRESSION: Left-sided PICC line terminates in the expected location of superior vena cava. No evidence of pneumothorax. Endotracheal tube terminates 8.8 cm above the carina. Advancement with approximately 4 cm may be considered. Low lung volumes with chronic bronchitic changes. Small left pleural effusion. Electronically Signed   By: Fidela Salisbury M.D.   On: 08/30/2017 15:26   Dg Chest Port 1 View  Result Date: 09/01/2017 CLINICAL DATA:  84 year old male with history of respiratory failure. History of colon cancer. EXAM: PORTABLE CHEST 1 VIEW COMPARISON:  Chest x-ray 08/31/2017. FINDINGS: An endotracheal tube is in place with tip 4.8 cm above the carina. There is a left upper extremity PICC with tip terminating in the proximal to mid superior vena cava. Lung volumes are low. Mild elevation of the right hemidiaphragm. Bibasilar opacities are favored to reflect areas of mild subsegmental atelectasis. There is cephalization of the pulmonary vasculature and slight indistinctness of the interstitial markings suggestive of mild pulmonary edema. Small bilateral pleural effusions. Mild cardiomegaly. The  patient is rotated to the right on today's exam, resulting in distortion of the mediastinal contours and reduced diagnostic sensitivity and specificity for mediastinal pathology. IMPRESSION: 1. Support apparatus, as above. 2. The appearance of the chest suggests mild congestive heart failure, as above. Electronically Signed   By: Vinnie Langton M.D.   On: 09/01/2017 07:01   Dg Chest Port 1 View  Result Date: 08/31/2017 CLINICAL DATA:  Intubated patient. EXAM: PORTABLE CHEST 1 VIEW COMPARISON:  08/30/2017 FINDINGS: Endotracheal tube is appropriately positioned above the carina. Nasogastric tube extends into the abdomen. Persistent densities at the left chest base. Left hemidiaphragm remains obscured. Mild fullness of the central vascular structures and interstitial markings. Left arm PICC tip is near the SVC. Negative for a pneumothorax. IMPRESSION: Persistent densities at the left lung base. Findings could represent a combination of consolidation and pleural fluid. No significant change. Electronically Signed   By: Markus Daft M.D.   On: 08/31/2017 08:34   Portable Chest X-ray  Result Date: 08/30/2017 CLINICAL DATA:  84 year old male status post intubation. EXAM: PORTABLE CHEST 1 VIEW COMPARISON:  Chest radiograph dated 08/29/2017. FINDINGS: There has been interval placement of an endotracheal tube with tip approximately at the level of the  carina. Recommend retraction of the tube by approximately 3-4 cm. Enteric tube extends below the diaphragm with side port in the left upper abdomen and tip beyond the inferior margin of the image. There is shallow inspiration with bibasilar atelectatic changes. A small left pleural effusion may be present. Pneumonia is not excluded. There is no pneumothorax. Stable mild cardiomegaly. No acute osseous pathology. IMPRESSION: 1. Endotracheal tube at the level of the carina. Recommend retraction by approximately 3-4 cm for optimal positioning. 2. Shallow inspiration with  bibasilar atelectasis. Possible small left pleural effusion. These results were called by telephone at the time of interpretation on 08/30/2017 at 2:03 am to Dr. Olevia Bowens, who verbally acknowledged these results. Electronically Signed   By: Anner Crete M.D.   On: 08/30/2017 02:17   Dg Chest Port 1 View  Result Date: 08/29/2017 CLINICAL DATA:  NG tube placement EXAM: PORTABLE CHEST 1 VIEW COMPARISON:  08/29/2017 FINDINGS: Cardiomegaly with vascular congestion. Small left effusion and patchy basilar atelectasis or infiltrate on the left. Esophageal tube tip extends below diaphragm but tip is incompletely included. IMPRESSION: 1. Esophageal tube tip is below the diaphragm but incompletely included on the current image. 2. Cardiomegaly with vascular congestion. Patchy atelectasis or infiltrate at the left base Electronically Signed   By: Donavan Foil M.D.   On: 08/29/2017 23:26   Dg Chest Port 1 View  Result Date: 08/29/2017 CLINICAL DATA:  Sudden onset shortness of breath EXAM: PORTABLE CHEST 1 VIEW COMPARISON:  08/24/2017, 08/20/2017 FINDINGS: Cardiomegaly with vascular congestion and mild interstitial opacities suggesting edema. Patchy atelectasis or infiltrate at the left base. No pneumothorax. IMPRESSION: 1. Cardiomegaly with vascular congestion and mild interstitial opacity suggesting minimal edema. 2. Patchy atelectasis versus minimal infiltrate at the left base. Electronically Signed   By: Donavan Foil M.D.   On: 08/29/2017 23:16   Dg Abd Acute W/chest  Result Date: 08/24/2017 CLINICAL DATA:  Nausea, vomiting, abdominal pain EXAM: DG ABDOMEN ACUTE W/ 1V CHEST COMPARISON:  08/20/2017 FINDINGS: Cardiomegaly.  Bibasilar atelectasis.  No effusions. Mildly prominent small bowel loops in the mid abdomen. Gas is noted within decompressed colon. Cannot exclude partial small bowel obstruction. No free air organomegaly. IMPRESSION: Dilated small bowel loops in the mid abdomen. Cannot exclude partial small  bowel obstruction. Cardiomegaly, bibasilar atelectasis. Electronically Signed   By: Rolm Baptise M.D.   On: 08/24/2017 14:53   Dg Abd Portable 2v  Result Date: 08/29/2017 CLINICAL DATA:  Shortness of breath, history of colon cancer EXAM: PORTABLE ABDOMEN - 2 VIEW COMPARISON:  CT abdomen 08/28/2017 FINDINGS: There is persistent severe dilatation of the small bowel. There is no evidence of pneumoperitoneum, portal venous gas or pneumatosis. There are no pathologic calcifications along the expected course of the ureters. There is contrast in the bladder from recent IV contrast injection. There is stable cardiomegaly. There elevation of the right diaphragm. The osseous structures are unremarkable. IMPRESSION: Persistent small-bowel obstruction. Electronically Signed   By: Kathreen Devoid   On: 08/29/2017 09:47     Microbiology: Recent Results (from the past 240 hour(s))  MRSA PCR Screening     Status: Abnormal   Collection Time: 08/28/17  4:55 PM  Result Value Ref Range Status   MRSA by PCR POSITIVE (A) NEGATIVE Final    Comment: RESULT CALLED TO, READ BACK BY AND VERIFIED WITH: HOWERTON M. @ 8299 ON [id number]        The GeneXpert MRSA Assay (FDA approved for NASAL specimens only), is one component of  a comprehensive MRSA colonization surveillance program. It is not intended to diagnose MRSA infection nor to guide or monitor treatment for MRSA infections.   Culture, Urine     Status: Abnormal   Collection Time: 08/29/17  7:26 AM  Result Value Ref Range Status   Specimen Description URINE, CLEAN CATCH  Final   Special Requests NONE  Final   Culture (A)  Final    <10,000 COLONIES/mL INSIGNIFICANT GROWTH Performed at [facility], [address], [place], [place] [address]    Report Status 08/30/2017 FINAL  Final  Culture, blood (Routine X 2) w Reflex to ID Panel     Status: None (Preliminary result)   Collection Time: 08/30/17 10:44 AM  Result Value Ref Range Status   Specimen  Description LEFT ANTECUBITAL  Final   Special Requests   Final    BOTTLES DRAWN AEROBIC AND ANAEROBIC Blood Culture adequate volume   Culture  Setup Time PENDING  Incomplete   Culture NO GROWTH 3 DAYS  Final   Report Status PENDING  Incomplete  Culture, blood (Routine X 2) w Reflex to ID Panel     Status: None (Preliminary result)   Collection Time: 08/30/17 10:45 AM  Result Value Ref Range Status   Specimen Description BLOOD LEFT HAND  Final   Special Requests   Final    BOTTLES DRAWN AEROBIC AND ANAEROBIC Blood Culture adequate volume   Culture NO GROWTH 3 DAYS  Final   Report Status PENDING  Incomplete     Labs: Basic Metabolic Panel: Recent Labs  Lab 08/28/17 1014 08/29/17 0444 08/30/17 0437 08/31/17 0121 08/31/17 1045 09/01/17 0336  NA  --  144 148* 151* 147* 145  K  --  4.5 4.5 6.3* 3.7 4.2  CL  --  111 115* 124* 95* 117*  CO2  --  24 20* 16* 34* 18*  GLUCOSE  --  98 85 81 220* 152*  BUN  --  25* 37* 41* 38* 53*  CREATININE  --  1.60* 2.23* 2.68* 2.16* 3.18*  CALCIUM  --  8.8* 8.5* 8.0* 6.0* 8.2*  MG 2.4  --   --  2.1 1.5* 2.2  PHOS  --   --   --  7.0*  --   --    Liver Function Tests: Recent Labs  Lab 08/28/17 1009 08/31/17 0121 09/01/17 0336  AST 57* 44* 61*  ALT 52 30 40  ALKPHOS 116 73 76  BILITOT 1.2 2.9* 2.2*  PROT 8.3* 6.1* 5.4*  ALBUMIN 3.2* 2.5* 1.9*   Recent Labs  Lab 08/28/17 1009  LIPASE 22   No results for input(s): AMMONIA in the last 168 hours. CBC: Recent Labs  Lab 08/28/17 1009 08/29/17 0444 08/30/17 0437 08/31/17 0121 09/01/17 0336  WBC 19.1* 19.3* 30.9* 34.3* 28.3*  NEUTROABS 15.4*  --   --  32.3*  --   HGB 14.8 14.5 15.5 12.9* 11.8*  HCT 44.9 45.8 48.5 42.9 37.3*  MCV 96.8 99.6 102.3* 105.7* 100.0  PLT 270 266 193 165 124*   Cardiac Enzymes: No results for input(s): CKTOTAL, CKMB, CKMBINDEX, TROPONINI in the last 168 hours. BNP: Invalid input(s): POCBNP CBG: Recent Labs  Lab 08/31/17 1940 08/31/17 2354  09/01/17 0337 09/01/17 0740 09/01/17 1130  GLUCAP 119* 144* 140* 140* 150*    Time coordinating discharge:  Greater than 30 minutes  Signed:  Orson Eva, DO [facility] Pager: [phone] 09/01/2017, 8:29 PM

## 2017-09-02 NOTE — Clinical Social Work Note (Signed)
LCSW notified by Palliative Care APNP, Aniceto Boss, that pt needs a referral to [facility] for residential hospice or GIP here. Referral made to Cassandra at [facility]. Per Cassandra, pt will likely need to be GIP based on their bed status.  LCSW will be available as needed.

## 2017-09-02 NOTE — Clinical Social Work Note (Signed)
Demographics  Comment    Last edited by  on at   Address: Home Phone: Work Phone: Mobile Phone:  [place]  [place] [address]   [phone] -- --  SSN: Insurance: Marital Status: Religion:  [id number] [place] Married (none)  Emergency Contact(s)   Name Daisy Sister [phone]    Patient Ethnicity & Race   Ethnic Group Patient Race  Not Hispanic or Latino White or [facility] Account   Name Acct ID Class Status Primary Coverage  [place], [place] [id number] Inpatient Open MEDICARE - MEDICARE PART A AND B      Guarantor Account (for [place] [id number])   Name Relation to Pt Service Area Active? Acct Type  Howie Ill Self CHSA Yes Personal/Family  Address Phone    [address] [place], [place] [address] [phone])        Coverage Information (for Hospital Account [id number])   1. [place] PART A AND B   F/O Payor/Plan Precert #  MEDICARE/MEDICARE PART A AND B   Subscriber Subscriber #  [place], [employer] [id number]  Address Phone  [address] [place], [place] [address]   2. AARP/AARP   F/O Payor/Plan Precert #  AARP/AARP   Subscriber Subscriber #  [place], [place] [id number]  Address Phone  [address] [place] [address] [phone]

## 2017-09-02 NOTE — Plan of Care (Signed)
Patient has been made comfort measures

## 2017-09-03 DIAGNOSIS — R6521 Severe sepsis with septic shock: Secondary | ICD-10-CM

## 2017-09-03 DIAGNOSIS — A419 Sepsis, unspecified organism: Principal | ICD-10-CM

## 2017-09-03 DIAGNOSIS — Z515 Encounter for palliative care: Secondary | ICD-10-CM

## 2017-09-03 MED ORDER — ATROPINE SULFATE 1 % OP SOLN
2.0000 [drp] | Freq: Four times a day (QID) | OPHTHALMIC | Status: DC
  Administered 2017-09-03 (×2): 2 [drp] via SUBLINGUAL
  Filled 2017-09-03: qty 2

## 2017-09-03 MED ORDER — MORPHINE SULFATE (CONCENTRATE) 10 MG/0.5ML PO SOLN
5.0000 mg | ORAL | Status: DC | PRN
  Administered 2017-09-03 (×2): 5 mg via ORAL
  Filled 2017-09-03 (×2): qty 0.5

## 2017-09-03 MED ORDER — ATROPINE SULFATE 1 % OP SOLN
2.0000 [drp] | Freq: Four times a day (QID) | OPHTHALMIC | Status: DC
  Filled 2017-09-03: qty 2

## 2017-09-04 LAB — CULTURE, BLOOD (ROUTINE X 2)
Culture: NO GROWTH
Special Requests: ADEQUATE

## 2017-09-06 LAB — CULTURE, BLOOD (ROUTINE X 2)
CULTURE: NO GROWTH
SPECIAL REQUESTS: ADEQUATE

## 2017-09-11 ENCOUNTER — Ambulatory Visit: Payer: Medicare Other | Admitting: Cardiology

## 2017-09-12 NOTE — Progress Notes (Signed)
PROGRESS NOTE    Joel Bishop  UJW:[id number] DOB: 12-Aug-1928 DOA: September 10, 2017 PCP: Rosita Fire, MD     Brief Narrative:  84 year old man initially admitted on 1/17 due to nausea vomiting and presumed small bowel obstruction.  On 1/18 he vomited and subsequently aspirated causing respiratory distress, required intubation and pressors due to hypotension.  He was placed on broad-spectrum antibiotics.  Pulmonary assisted with ventilator management.  He remained critically ill for the next 3 days.  There was no significant improvement.  Palliative care was consulted.  After discussion with the patient's sister it was decided to transition focus of care to full comfort.  Plan was for residential hospice placement, however due to lack of bed availability he was transitioned to GIP status.  The patient was extubated under compassionate means.   Assessment & Plan:     Septic Shock/aspiration pneumonia/acute hypoxemic respiratory failure -He has now been extubated by compassionate means, focus of care to full comfort. -Patient currently remains unresponsive, does not appear to be in any respiratory distress or pain. -Continue as needed boluses of Roxanol. -Atropine has been added today for secretion management. -Anticipate hospital death versus residential hospice pending bed availability.   DVT prophylaxis: None Code Status: DNR Family Communication: None Disposition Plan: Hospital death versus residential hospice  Consultants:   Palliative care  Procedures:   None  Antimicrobials:  Anti-infectives (From admission, onward)   None       Subjective: Lying in bed, unresponsive, NG remains in place.  Objective: There were no vitals filed for this visit. No intake or output data in the 24 hours ending 09-10-2017 1049 There were no vitals filed for this visit.  Examination:   Patient is unresponsive, has coarse bilateral breath sounds, NG remains in place.  Has significant  lower extremity edema.   Data Reviewed: I have personally reviewed following labs and imaging studies  CBC: Recent Labs  Lab 08/28/17 1009 08/29/17 0444 08/30/17 0437 08/31/17 0121 09/01/17 0336  WBC 19.1* 19.3* 30.9* 34.3* 28.3*  NEUTROABS 15.4*  --   --  32.3*  --   HGB 14.8 14.5 15.5 12.9* 11.8*  HCT 44.9 45.8 48.5 42.9 37.3*  MCV 96.8 99.6 102.3* 105.7* 100.0  PLT 270 266 193 165 562*   Basic Metabolic Panel: Recent Labs  Lab 08/28/17 1014 08/29/17 0444 08/30/17 0437 08/31/17 0121 08/31/17 1045 09/01/17 0336  NA  --  144 148* 151* 147* 145  K  --  4.5 4.5 6.3* 3.7 4.2  CL  --  111 115* 124* 95* 117*  CO2  --  24 20* 16* 34* 18*  GLUCOSE  --  98 85 81 220* 152*  BUN  --  25* 37* 41* 38* 53*  CREATININE  --  1.60* 2.23* 2.68* 2.16* 3.18*  CALCIUM  --  8.8* 8.5* 8.0* 6.0* 8.2*  MG 2.4  --   --  2.1 1.5* 2.2  PHOS  --   --   --  7.0*  --   --    GFR: Estimated Creatinine Clearance: 18.5 mL/min (A) (by C-G formula based on SCr of 3.18 mg/dL (H)). Liver Function Tests: Recent Labs  Lab 08/28/17 1009 08/31/17 0121 09/01/17 0336  AST 57* 44* 61*  ALT 52 30 40  ALKPHOS 116 73 76  BILITOT 1.2 2.9* 2.2*  PROT 8.3* 6.1* 5.4*  ALBUMIN 3.2* 2.5* 1.9*   Recent Labs  Lab 08/28/17 1009  LIPASE 22   No results for input(s): AMMONIA in  the last 168 hours. Coagulation Profile: No results for input(s): INR, PROTIME in the last 168 hours. Cardiac Enzymes: No results for input(s): CKTOTAL, CKMB, CKMBINDEX, TROPONINI in the last 168 hours. BNP (last 3 results) No results for input(s): PROBNP in the last 8760 hours. HbA1C: No results for input(s): HGBA1C in the last 72 hours. CBG: Recent Labs  Lab 08/31/17 1940 08/31/17 2354 09/01/17 0337 09/01/17 0740 09/01/17 1130  GLUCAP 119* 144* 140* 140* 150*   Lipid Profile: No results for input(s): CHOL, HDL, LDLCALC, TRIG, CHOLHDL, LDLDIRECT in the last 72 hours. Thyroid Function Tests: No results for input(s):  TSH, T4TOTAL, FREET4, T3FREE, THYROIDAB in the last 72 hours. Anemia Panel: No results for input(s): VITAMINB12, FOLATE, FERRITIN, TIBC, IRON, RETICCTPCT in the last 72 hours. Urine analysis:    Component Value Date/Time   COLORURINE YELLOW 08/29/2017 0726   APPEARANCEUR CLEAR 08/29/2017 0726   LABSPEC 1.017 08/29/2017 0726   PHURINE 5.0 08/29/2017 0726   GLUCOSEU NEGATIVE 08/29/2017 0726   HGBUR MODERATE (A) 08/29/2017 0726   BILIRUBINUR NEGATIVE 08/29/2017 0726   KETONESUR NEGATIVE 08/29/2017 0726   PROTEINUR 30 (A) 08/29/2017 0726   NITRITE NEGATIVE 08/29/2017 0726   LEUKOCYTESUR NEGATIVE 08/29/2017 0726   Sepsis Labs: @LABRCNTIP (procalcitonin:4,lacticidven:4)  ) Recent Results (from the past 240 hour(s))  MRSA PCR Screening     Status: Abnormal   Collection Time: 08/28/17  4:55 PM  Result Value Ref Range Status   MRSA by PCR POSITIVE (A) NEGATIVE Final    Comment: RESULT CALLED TO, READ BACK BY AND VERIFIED WITH: HOWERTON M. @ 1610 ON [id number]        The GeneXpert MRSA Assay (FDA approved for NASAL specimens only), is one component of a comprehensive MRSA colonization surveillance program. It is not intended to diagnose MRSA infection nor to guide or monitor treatment for MRSA infections.   Culture, Urine     Status: Abnormal   Collection Time: 08/29/17  7:26 AM  Result Value Ref Range Status   Specimen Description URINE, CLEAN CATCH  Final   Special Requests NONE  Final   Culture (A)  Final    <10,000 COLONIES/mL INSIGNIFICANT GROWTH Performed at [facility], [address] [place], [place] [address]    Report Status 08/30/2017 FINAL  Final  Culture, blood (Routine X 2) w Reflex to ID Panel     Status: None (Preliminary result)   Collection Time: 08/30/17 10:44 AM  Result Value Ref Range Status   Specimen Description LEFT ANTECUBITAL  Final   Special Requests   Final    BOTTLES DRAWN AEROBIC AND ANAEROBIC Blood Culture adequate volume   Culture   Setup Time PENDING  Incomplete   Culture NO GROWTH 4 DAYS  Final   Report Status PENDING  Incomplete  Culture, blood (Routine X 2) w Reflex to ID Panel     Status: None (Preliminary result)   Collection Time: 08/30/17 10:45 AM  Result Value Ref Range Status   Specimen Description BLOOD LEFT HAND  Final   Special Requests   Final    BOTTLES DRAWN AEROBIC AND ANAEROBIC Blood Culture adequate volume   Culture NO GROWTH 4 DAYS  Final   Report Status PENDING  Incomplete         Radiology Studies: No results found.      Scheduled Meds: . atropine  2 drop Sublingual QID   Continuous Infusions:   LOS: 0 days    Time spent: 15 minutes. Greater than  50% of this time was spent in direct contact with the patient coordinating care.     Lelon Frohlich, MD [facility] Pager [phone]  If 7PM-7AM, please contact night-coverage [email or web address] Password [facility] 09/25/17, 10:49 AM

## 2017-09-12 NOTE — Progress Notes (Signed)
Daily Progress Note   Patient Name: Angeles Paolucci       Date: 19-Sep-2017 DOB: [birth year]  Age: 84 y.o. MRN#: [id number] Attending Physician: Isaac Bliss, [place] Primary Care Physician: Rosita Fire, MD Admit Date: 2017/09/19  Reason for Consultation/Follow-up: Establishing goals of care, Non pain symptom management and Psychosocial/spiritual support  Subjective: Mr. Carin Hock is resting quietly in bed. He is actively dying. There is no family of bedside at this time.  NG tube removed for comfort as there has been minimal output. Mr. Gassman is untethered from oxygen, in an attempt to make you more comfortable.  Call to sister, Claretta Fraise, advised of current situation, Mr. Triggs state of comfort.  Detail conversation with nursing staff related to patient care.  Length of Stay: 0  Current Medications: Scheduled Meds:  . atropine  2 drop Sublingual QID    Continuous Infusions:   PRN Meds: morphine CONCENTRATE  Physical Exam  Constitutional: No distress.  HENT:  Head: Atraumatic.  Temporal and orbital wasting  Cardiovascular: Normal rate.  Pulmonary/Chest:  Death rattle  Abdominal: Soft.  Musculoskeletal: He exhibits edema.  Neurological:  Does not respond to voice or touch  Skin: Skin is warm and dry.  Vitals reviewed.           Vital Signs: There were no vitals taken for this visit. SpO2:   O2 Device: O2 Device: Not Delivered O2 Flow Rate:    Intake/output summary: No intake or output data in the 24 hours ending 09/19/17 1544 LBM:   Baseline Weight:   Most recent weight:         Palliative Assessment/Data:      Patient Active Problem List   Diagnosis Date Noted  . Encounter for hospice care discussion   . Palliative care encounter   .  Goals of care, counseling/discussion   . Hyperkalemia 08/31/2017  . Hydronephrosis 08/31/2017  . Sepsis due to undetermined organism ([place]) 08/30/2017  . Aspiration pneumonia of both lower lobes due to gastric secretions ([place]) 08/30/2017  . Acute respiratory failure with hypoxia ([place]) 08/30/2017  . Sepsis ([place]) 08/30/2017  . Endotracheal tube present   . AKI (acute kidney injury) ([place])   . SBO (small bowel obstruction) ([place]) 08/28/2017  . Hypokalemia 08/28/2017  . AF (paroxysmal atrial fibrillation) ([place]) 08/28/2017  . Leucocytosis 08/28/2017  .  Essential hypertension 08/28/2017  . SAH (subarachnoid hemorrhage) ([place]) 08/20/2017  . Encounter for therapeutic drug monitoring 09/27/2013  . Colon carcinoma ([place])   . Atrial fibrillation ([place]) 05/29/2011  . Chronic anticoagulation 05/29/2011    Palliative Care Assessment & Plan   Patient Profile: 84 y.o.malewith past medical history of ostomy, fall with some arachnoid hemorrhage, aspiration, chronic kidney disease,admitted on 1/17/2019with sepsis, hypotension.   Assessment: sepsis, hypotension: Compassionate extubation 1/21.  Full comfort care.  Attempted placement at local hospice home today.  Recommendations/Plan:  Full comfort care.   under hospice care, no beds available at local facility. Anticipated Hospital death.   Goals of Care and Additional Recommendations:  Limitations on Scope of Treatment: Full Comfort Care  Code Status:    Code Status Orders  (From admission, onward)        Start     Ordered   09-10-2017 1054  Do not attempt resuscitation (DNR)  Continuous    Question Answer Comment  In the event of cardiac or respiratory ARREST Do not call a "code blue"   In the event of cardiac or respiratory ARREST Do not perform Intubation, CPR, defibrillation or ACLS   In the event of cardiac or respiratory ARREST Use medication by any route, position, wound care, and other measures to relive pain and suffering. May  use oxygen, suction and manual treatment of airway obstruction as needed for comfort.      09-10-17 1053    Code Status History    Date Active Date Inactive Code Status Order ID Comments User Context   08/30/2017 12:04 September 10, 2017 09:55 DNR [id number]  Orson Eva, MD Inpatient   08/28/2017 15:49 08/30/2017 12:04 Full Code [id number]  Louellen Molder, MD Inpatient   08/21/2017 00:45 08/23/2017 16:13 Full Code [id number]  Oswald Hillock, MD Inpatient       Prognosis:   Hours - Days  Discharge Planning:  Anticipated Hospital Death  Care plan was discussed with nursing staff, case management, social worker, and Dr. Candyce Churn.  Thank you for allowing the Palliative Medicine Team to assist in the care of this patient.   Time In: 0850  Time Out: 0915  Total Time 25 minutes  Prolonged Time Billed  no       Greater than 50%  of this time was spent counseling and coordinating care related to the above assessment and plan.  Drue Novel, NP  Please contact Palliative Medicine Team phone at [phone] for questions and concerns.

## 2017-09-12 DEATH — deceased

## 2018-08-02 IMAGING — DX DG CHEST 1V
1 series · 1 of 1 positions shown · non-contrast
Comparison: None.

CLINICAL DATA: Generalized weakness

EXAM:
CHEST 1 VIEW

[chest ap]
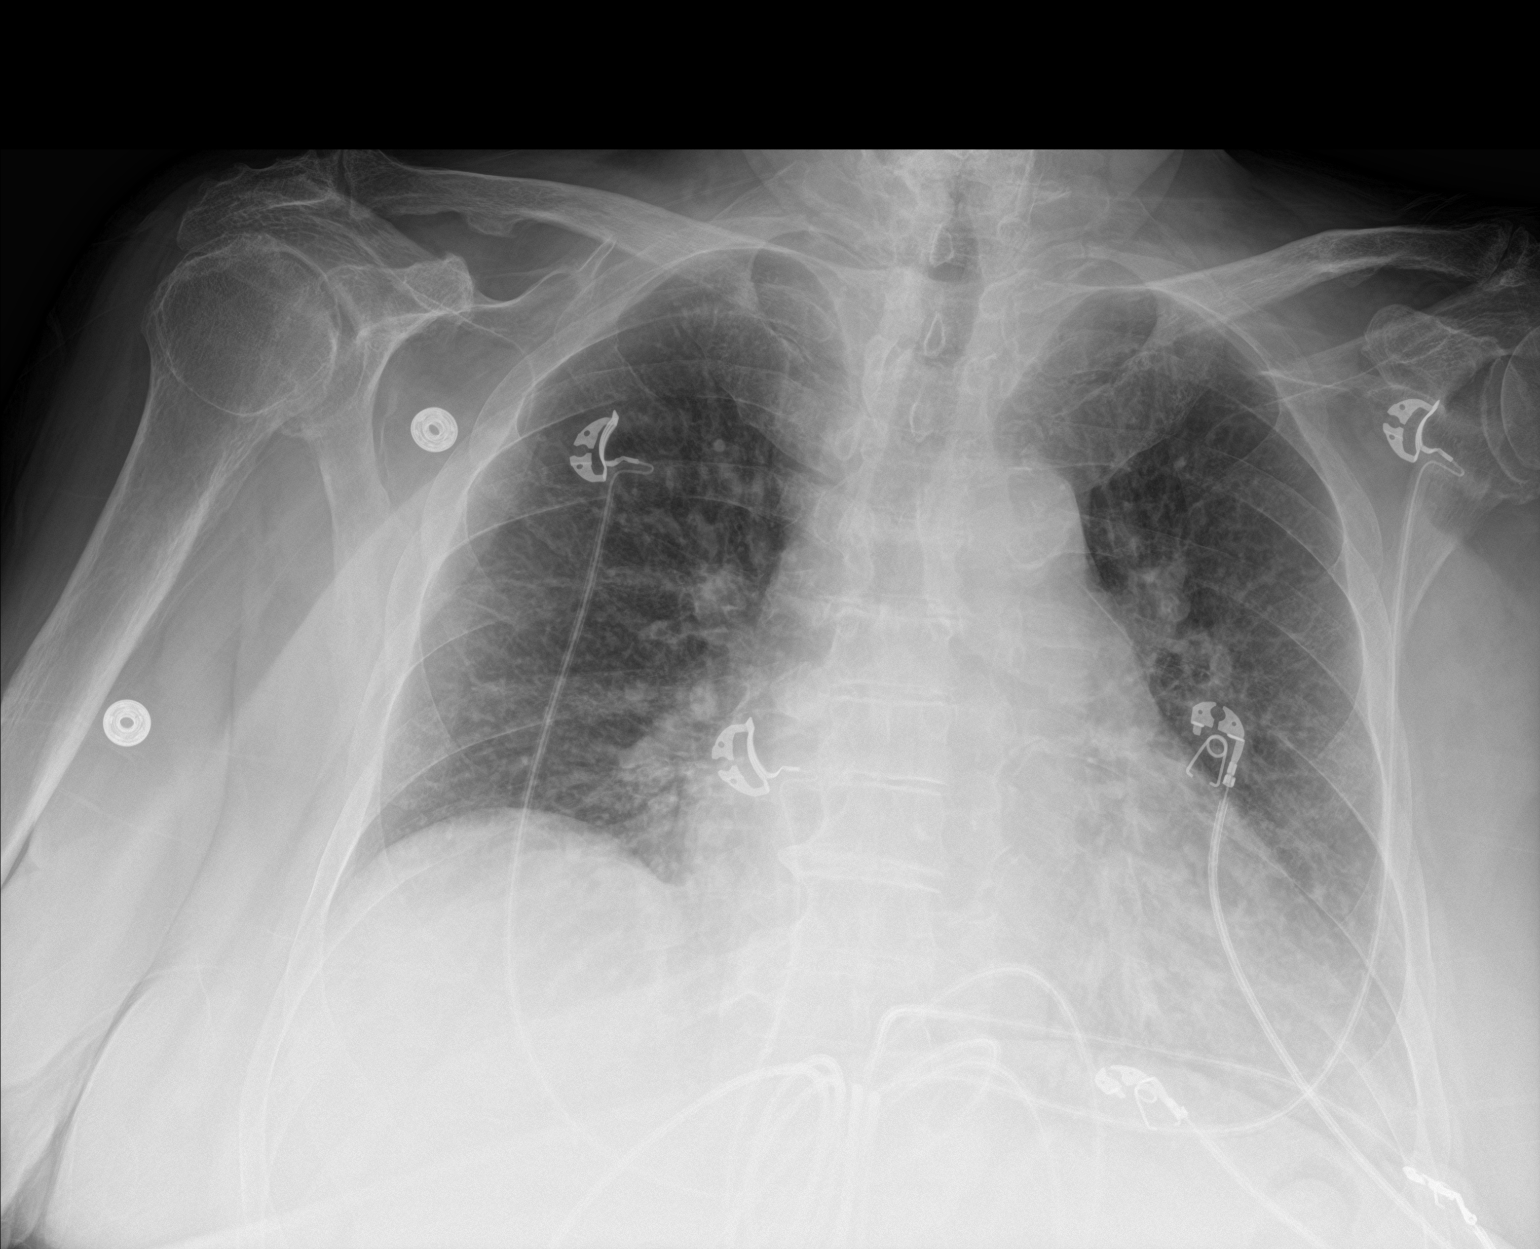

[1 of 1 positions shown; findings below may reference images not displayed]

FINDINGS: Cardiomegaly and vascular pedicle widening. Congested appearance of
vessels without Kerley lines or effusion. No pneumothorax or air
bronchogram.

Artifact from EKG leads.
IMPRESSION: Cardiomegaly and vascular congestion accentuated by low volumes.

## 2018-08-02 IMAGING — CT CT HEAD W/O CM
3 series · 15 of 47 positions shown, 18 images · non-contrast
Comparison: No priors.

CLINICAL DATA: 89-year-old male with history of confusion today.
Swelling in both legs.

EXAM:
CT HEAD WITHOUT CONTRAST
TECHNIQUE: Contiguous axial images were obtained from the base of the skull
through the vertex without intravenous contrast.

[Series 3: head trauma wo · axial · 0.42mm/px · z∈[+55,+180]mm · 9 of 30 slices shown, 12 images]
[im 3/30  brain]
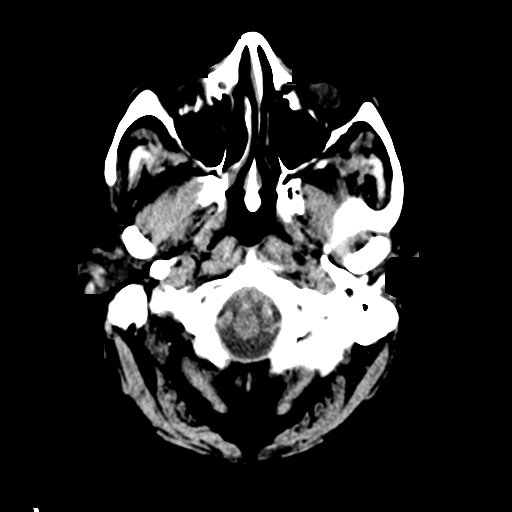
[im 3/30  bone]
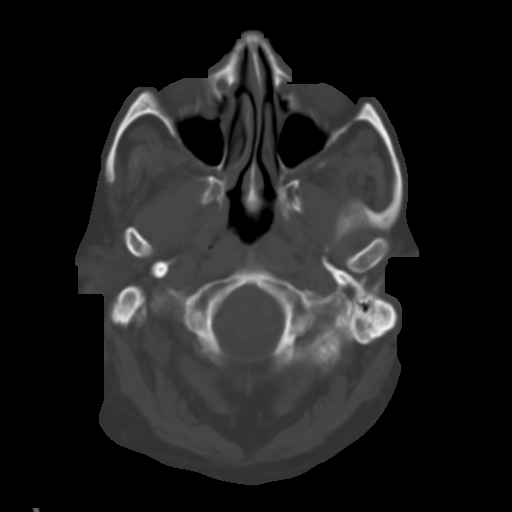
[im 6/30  brain]
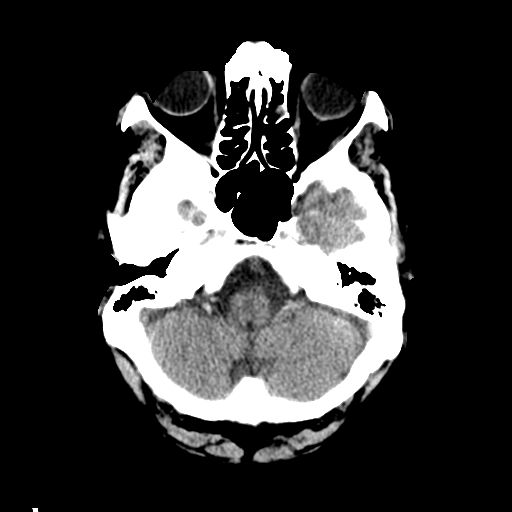
[im 9/30  brain]
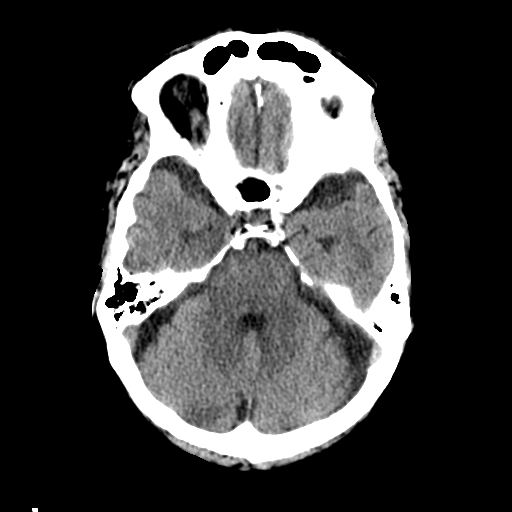
[im 12/30  brain]
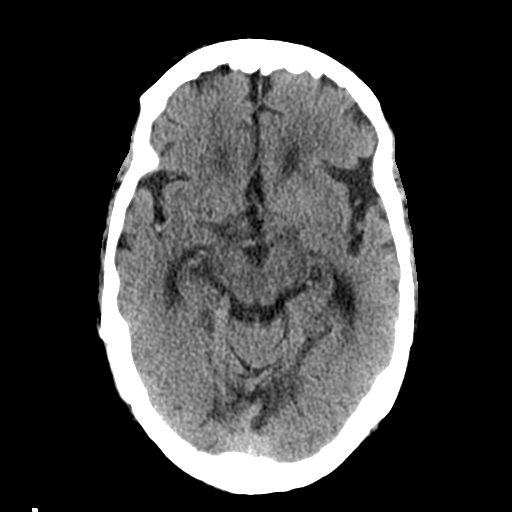
[im 16/30  brain]
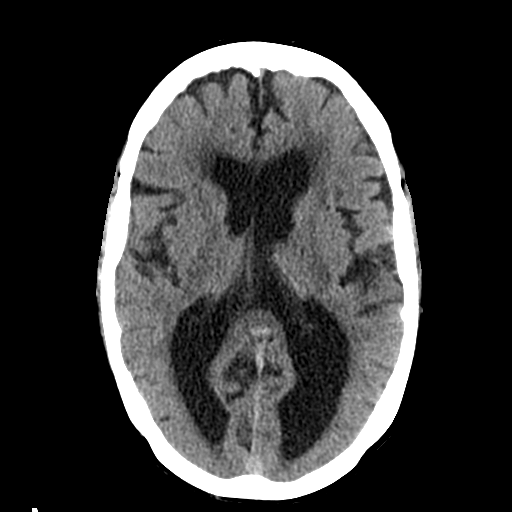
[im 16/30  bone]
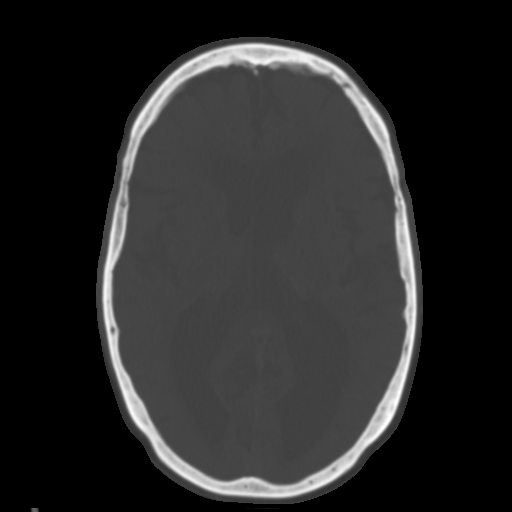
[im 19/30  brain]
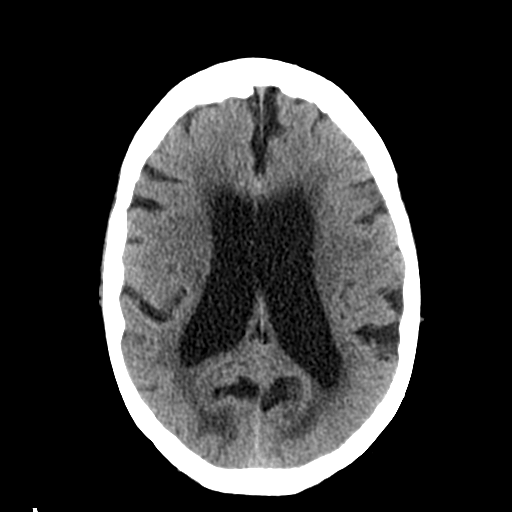
[im 22/30  brain]
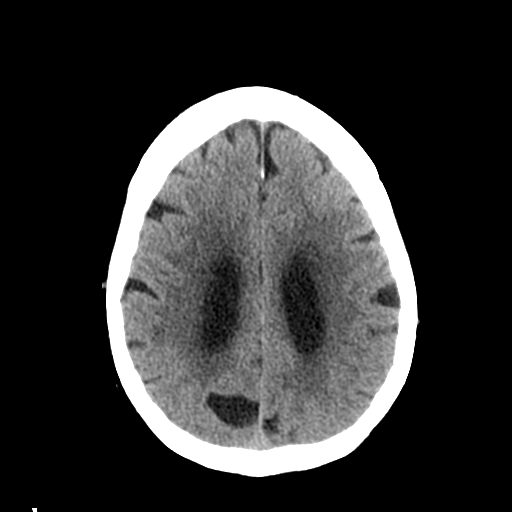
[im 25/30  brain]
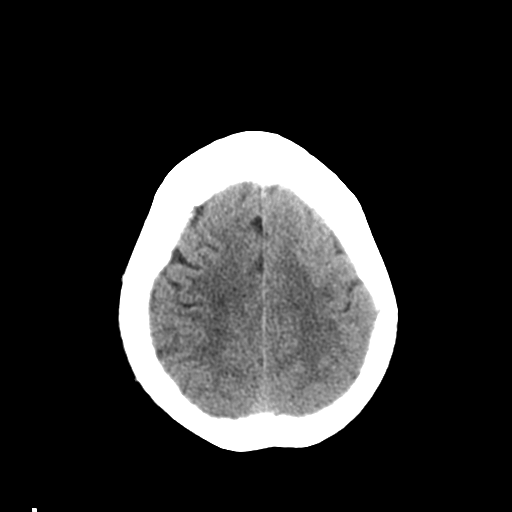
[im 28/30  brain]
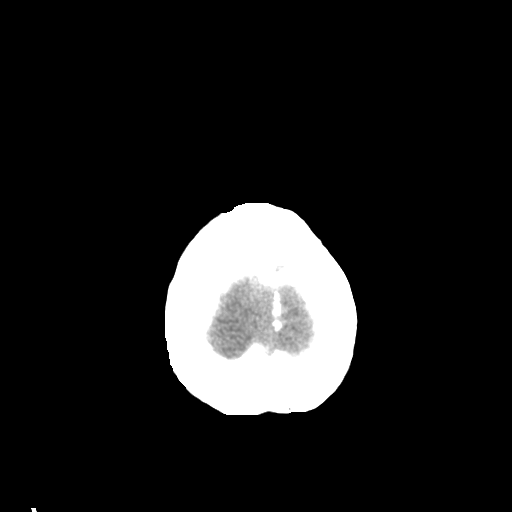
[im 28/30  bone]
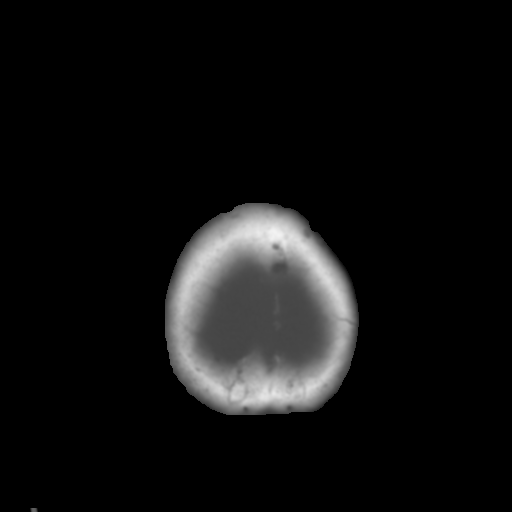

[Series 5: coronal soft tissue · coronal · 0.31mm/px · 3 of 71 slices shown]
[im 24/71  brain]
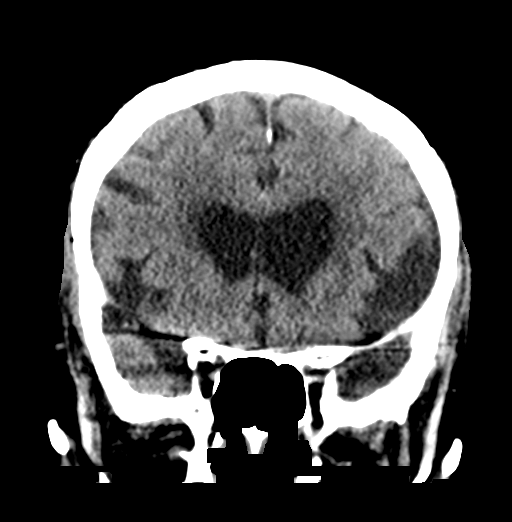
[im 32/71  brain]
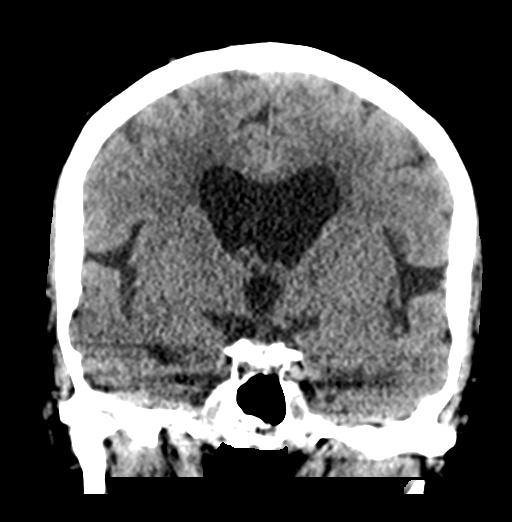
[im 39/71  brain]
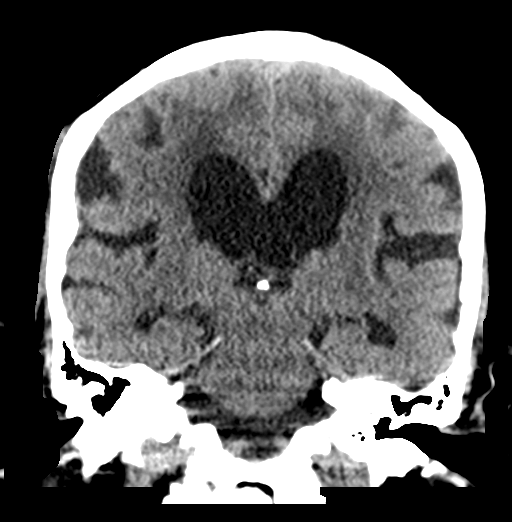

[Series 6: sagittal soft tissue · sagittal · 0.33mm/px · 3 of 52 slices shown]
[im 18/52  brain]
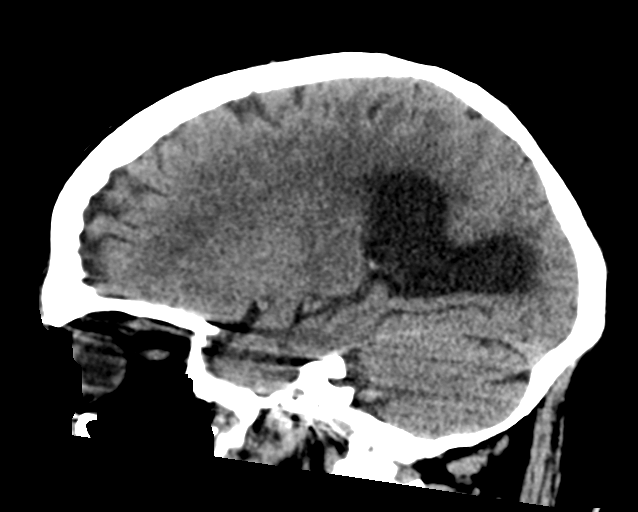
[im 26/52  brain]
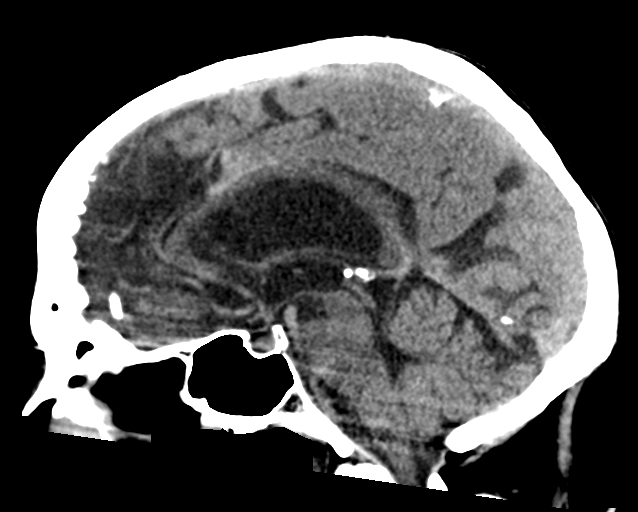
[im 35/52  brain]
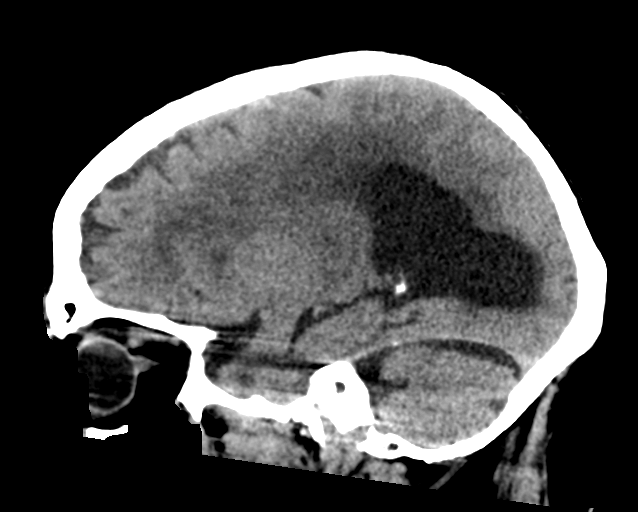

[15 of 47 positions shown; findings below may reference images not displayed]

FINDINGS: Brain: Small amount of high attenuation in the anterior aspect of
the interhemispheric fissure best appreciated on axial images 20 and
21 and coronal images 25 through 29. Patchy and confluent areas of
decreased attenuation are noted throughout the deep and
periventricular white matter of the cerebral hemispheres
bilaterally, compatible with chronic microvascular ischemic disease.
Moderate to severe cerebral atrophy with associated ex vacuo
dilatation of the ventricular system. No evidence of acute
infarction, mass or mass effect.

Vascular: No hyperdense vessel or unexpected calcification.

Skull: Normal. Negative for fracture or focal lesion.

Sinuses/Orbits: No acute finding.

Other: None.
IMPRESSION: 1. Small amount of subarachnoid hemorrhage noted in the anterior
interhemispheric fissure.
2. Moderate to severe cerebral atrophy with probable ex vacuo
dilatation of the ventricular system. The possibility of normal
pressure hydrocephalus (NPH) is not entirely excluded.
3. Extensive chronic microvascular ischemic changes throughout the
cerebral white matter, as above.

Critical Value/emergent results were called by telephone at the time
of interpretation on 08/20/2017 at [DATE] to Dr. ARFAWI ROHI, who
verbally acknowledged these results.
